# Patient Record
Sex: Female | Born: 1970 | Race: Black or African American | Hispanic: No | Smoking: Never smoker
Health system: Southern US, Community
[De-identification: ages and names within clinical notes are randomized; demographics above are authoritative.]

## PROBLEM LIST (undated history)

## (undated) DIAGNOSIS — E039 Hypothyroidism, unspecified: Secondary | ICD-10-CM

## (undated) DIAGNOSIS — E119 Type 2 diabetes mellitus without complications: Secondary | ICD-10-CM

## (undated) DIAGNOSIS — R51 Headache: Secondary | ICD-10-CM

## (undated) DIAGNOSIS — D259 Leiomyoma of uterus, unspecified: Secondary | ICD-10-CM

## (undated) DIAGNOSIS — B009 Herpesviral infection, unspecified: Secondary | ICD-10-CM

## (undated) DIAGNOSIS — E785 Hyperlipidemia, unspecified: Secondary | ICD-10-CM

## (undated) DIAGNOSIS — I10 Essential (primary) hypertension: Secondary | ICD-10-CM

## (undated) HISTORY — PX: TUBAL LIGATION: SHX77

## (undated) HISTORY — PX: THYROID SURGERY: SHX805

## (undated) HISTORY — PX: COLONOSCOPY: SHX174

## (undated) HISTORY — PX: VAGINAL HYSTERECTOMY: SUR661

## (undated) HISTORY — PX: POLYPECTOMY: SHX149

## (undated) HISTORY — PX: CHOLECYSTECTOMY: SHX55

## (undated) HISTORY — PX: KNEE SURGERY: SHX244

## (undated) HISTORY — DX: Hyperlipidemia, unspecified: E78.5

---

## 1997-09-27 ENCOUNTER — Inpatient Hospital Stay (HOSPITAL_COMMUNITY): Admission: AD | Admit: 1997-09-27 | Discharge: 1997-09-27 | Payer: Self-pay | Admitting: Obstetrics

## 1997-09-30 ENCOUNTER — Inpatient Hospital Stay (HOSPITAL_COMMUNITY): Admission: AD | Admit: 1997-09-30 | Discharge: 1997-09-30 | Payer: Self-pay | Admitting: Obstetrics

## 1998-04-09 ENCOUNTER — Emergency Department (HOSPITAL_COMMUNITY): Admission: EM | Admit: 1998-04-09 | Discharge: 1998-04-09 | Payer: Self-pay | Admitting: Emergency Medicine

## 1998-04-21 ENCOUNTER — Emergency Department (HOSPITAL_COMMUNITY): Admission: EM | Admit: 1998-04-21 | Discharge: 1998-04-21 | Payer: Self-pay | Admitting: Emergency Medicine

## 1998-06-19 ENCOUNTER — Inpatient Hospital Stay (HOSPITAL_COMMUNITY): Admission: AD | Admit: 1998-06-19 | Discharge: 1998-06-19 | Payer: Self-pay | Admitting: Obstetrics & Gynecology

## 1999-05-01 ENCOUNTER — Inpatient Hospital Stay (HOSPITAL_COMMUNITY): Admission: AD | Admit: 1999-05-01 | Discharge: 1999-05-01 | Payer: Self-pay | Admitting: *Deleted

## 1999-05-03 ENCOUNTER — Encounter: Payer: Self-pay | Admitting: Obstetrics

## 1999-05-03 ENCOUNTER — Inpatient Hospital Stay (HOSPITAL_COMMUNITY): Admission: AD | Admit: 1999-05-03 | Discharge: 1999-05-03 | Payer: Self-pay | Admitting: Obstetrics

## 1999-05-05 ENCOUNTER — Encounter: Admission: RE | Admit: 1999-05-05 | Discharge: 1999-05-05 | Payer: Self-pay | Admitting: Obstetrics & Gynecology

## 1999-11-09 ENCOUNTER — Emergency Department (HOSPITAL_COMMUNITY): Admission: EM | Admit: 1999-11-09 | Discharge: 1999-11-09 | Payer: Self-pay | Admitting: Emergency Medicine

## 2000-03-14 ENCOUNTER — Inpatient Hospital Stay (HOSPITAL_COMMUNITY): Admission: AD | Admit: 2000-03-14 | Discharge: 2000-03-14 | Payer: Self-pay | Admitting: Obstetrics

## 2000-07-02 ENCOUNTER — Inpatient Hospital Stay (HOSPITAL_COMMUNITY): Admission: AD | Admit: 2000-07-02 | Discharge: 2000-07-02 | Payer: Self-pay | Admitting: *Deleted

## 2000-08-29 ENCOUNTER — Encounter: Payer: Self-pay | Admitting: Neurology

## 2000-08-29 ENCOUNTER — Ambulatory Visit (HOSPITAL_COMMUNITY): Admission: RE | Admit: 2000-08-29 | Discharge: 2000-08-29 | Payer: Self-pay | Admitting: Neurology

## 2000-12-03 ENCOUNTER — Inpatient Hospital Stay (HOSPITAL_COMMUNITY): Admission: EM | Admit: 2000-12-03 | Discharge: 2000-12-06 | Payer: Self-pay | Admitting: Emergency Medicine

## 2000-12-03 ENCOUNTER — Encounter (INDEPENDENT_AMBULATORY_CARE_PROVIDER_SITE_OTHER): Payer: Self-pay | Admitting: *Deleted

## 2000-12-03 ENCOUNTER — Encounter: Payer: Self-pay | Admitting: Emergency Medicine

## 2000-12-04 ENCOUNTER — Encounter: Payer: Self-pay | Admitting: Family Medicine

## 2000-12-15 ENCOUNTER — Encounter: Admission: RE | Admit: 2000-12-15 | Discharge: 2000-12-15 | Payer: Self-pay | Admitting: Sports Medicine

## 2000-12-16 ENCOUNTER — Encounter: Admission: RE | Admit: 2000-12-16 | Discharge: 2000-12-16 | Payer: Self-pay | Admitting: Internal Medicine

## 2001-03-12 ENCOUNTER — Emergency Department (HOSPITAL_COMMUNITY): Admission: EM | Admit: 2001-03-12 | Discharge: 2001-03-12 | Payer: Self-pay | Admitting: Emergency Medicine

## 2001-04-26 ENCOUNTER — Emergency Department (HOSPITAL_COMMUNITY): Admission: EM | Admit: 2001-04-26 | Discharge: 2001-04-26 | Payer: Self-pay | Admitting: Emergency Medicine

## 2001-05-24 ENCOUNTER — Inpatient Hospital Stay (HOSPITAL_COMMUNITY): Admission: AD | Admit: 2001-05-24 | Discharge: 2001-05-24 | Payer: Self-pay | Admitting: Obstetrics

## 2001-08-18 ENCOUNTER — Emergency Department (HOSPITAL_COMMUNITY): Admission: EM | Admit: 2001-08-18 | Discharge: 2001-08-18 | Payer: Self-pay

## 2001-10-05 ENCOUNTER — Inpatient Hospital Stay (HOSPITAL_COMMUNITY): Admission: AD | Admit: 2001-10-05 | Discharge: 2001-10-05 | Payer: Self-pay | Admitting: *Deleted

## 2002-05-11 ENCOUNTER — Inpatient Hospital Stay (HOSPITAL_COMMUNITY): Admission: AD | Admit: 2002-05-11 | Discharge: 2002-05-11 | Payer: Self-pay | Admitting: *Deleted

## 2002-05-15 ENCOUNTER — Emergency Department (HOSPITAL_COMMUNITY): Admission: EM | Admit: 2002-05-15 | Discharge: 2002-05-15 | Payer: Self-pay | Admitting: Emergency Medicine

## 2002-07-30 ENCOUNTER — Emergency Department (HOSPITAL_COMMUNITY): Admission: EM | Admit: 2002-07-30 | Discharge: 2002-07-30 | Payer: Self-pay | Admitting: Emergency Medicine

## 2002-11-30 ENCOUNTER — Inpatient Hospital Stay (HOSPITAL_COMMUNITY): Admission: AD | Admit: 2002-11-30 | Discharge: 2002-11-30 | Payer: Self-pay | Admitting: *Deleted

## 2002-11-30 ENCOUNTER — Encounter: Payer: Self-pay | Admitting: *Deleted

## 2002-12-28 ENCOUNTER — Emergency Department (HOSPITAL_COMMUNITY): Admission: EM | Admit: 2002-12-28 | Discharge: 2002-12-28 | Payer: Self-pay | Admitting: Emergency Medicine

## 2003-01-28 ENCOUNTER — Emergency Department (HOSPITAL_COMMUNITY): Admission: EM | Admit: 2003-01-28 | Discharge: 2003-01-28 | Payer: Self-pay | Admitting: Emergency Medicine

## 2003-01-29 ENCOUNTER — Emergency Department (HOSPITAL_COMMUNITY): Admission: EM | Admit: 2003-01-29 | Discharge: 2003-01-29 | Payer: Self-pay | Admitting: Emergency Medicine

## 2003-02-24 ENCOUNTER — Emergency Department (HOSPITAL_COMMUNITY): Admission: EM | Admit: 2003-02-24 | Discharge: 2003-02-24 | Payer: Self-pay | Admitting: Emergency Medicine

## 2003-03-16 ENCOUNTER — Emergency Department (HOSPITAL_COMMUNITY): Admission: EM | Admit: 2003-03-16 | Discharge: 2003-03-16 | Payer: Self-pay | Admitting: Emergency Medicine

## 2003-03-25 ENCOUNTER — Emergency Department (HOSPITAL_COMMUNITY): Admission: EM | Admit: 2003-03-25 | Discharge: 2003-03-26 | Payer: Self-pay

## 2003-03-27 ENCOUNTER — Emergency Department (HOSPITAL_COMMUNITY): Admission: EM | Admit: 2003-03-27 | Discharge: 2003-03-27 | Payer: Self-pay | Admitting: Emergency Medicine

## 2003-04-16 ENCOUNTER — Emergency Department (HOSPITAL_COMMUNITY): Admission: EM | Admit: 2003-04-16 | Discharge: 2003-04-16 | Payer: Self-pay | Admitting: Emergency Medicine

## 2003-04-19 ENCOUNTER — Emergency Department (HOSPITAL_COMMUNITY): Admission: EM | Admit: 2003-04-19 | Discharge: 2003-04-19 | Payer: Self-pay | Admitting: Emergency Medicine

## 2003-08-26 ENCOUNTER — Inpatient Hospital Stay (HOSPITAL_COMMUNITY): Admission: AD | Admit: 2003-08-26 | Discharge: 2003-08-26 | Payer: Self-pay | Admitting: Obstetrics & Gynecology

## 2003-10-28 ENCOUNTER — Inpatient Hospital Stay (HOSPITAL_COMMUNITY): Admission: AD | Admit: 2003-10-28 | Discharge: 2003-10-28 | Payer: Self-pay | Admitting: *Deleted

## 2004-02-04 ENCOUNTER — Inpatient Hospital Stay (HOSPITAL_COMMUNITY): Admission: AD | Admit: 2004-02-04 | Discharge: 2004-02-04 | Payer: Self-pay | Admitting: *Deleted

## 2004-03-07 ENCOUNTER — Emergency Department (HOSPITAL_COMMUNITY): Admission: EM | Admit: 2004-03-07 | Discharge: 2004-03-07 | Payer: Self-pay | Admitting: Emergency Medicine

## 2004-06-03 ENCOUNTER — Emergency Department (HOSPITAL_COMMUNITY): Admission: EM | Admit: 2004-06-03 | Discharge: 2004-06-03 | Payer: Self-pay | Admitting: Emergency Medicine

## 2004-09-15 ENCOUNTER — Emergency Department (HOSPITAL_COMMUNITY): Admission: EM | Admit: 2004-09-15 | Discharge: 2004-09-15 | Payer: Self-pay | Admitting: Emergency Medicine

## 2005-02-26 ENCOUNTER — Other Ambulatory Visit: Admission: RE | Admit: 2005-02-26 | Discharge: 2005-02-26 | Payer: Self-pay | Admitting: Obstetrics and Gynecology

## 2005-03-11 ENCOUNTER — Emergency Department (HOSPITAL_COMMUNITY): Admission: EM | Admit: 2005-03-11 | Discharge: 2005-03-11 | Payer: Self-pay | Admitting: Emergency Medicine

## 2005-04-09 ENCOUNTER — Ambulatory Visit: Payer: Self-pay | Admitting: Family Medicine

## 2005-05-17 ENCOUNTER — Emergency Department (HOSPITAL_COMMUNITY): Admission: EM | Admit: 2005-05-17 | Discharge: 2005-05-17 | Payer: Self-pay | Admitting: Emergency Medicine

## 2005-05-26 ENCOUNTER — Ambulatory Visit: Payer: Self-pay | Admitting: Family Medicine

## 2005-08-26 ENCOUNTER — Ambulatory Visit: Payer: Self-pay | Admitting: Sports Medicine

## 2005-11-29 ENCOUNTER — Ambulatory Visit: Payer: Self-pay | Admitting: Family Medicine

## 2005-11-30 ENCOUNTER — Encounter: Admission: RE | Admit: 2005-11-30 | Discharge: 2005-11-30 | Payer: Self-pay | Admitting: Sports Medicine

## 2005-12-13 ENCOUNTER — Encounter (INDEPENDENT_AMBULATORY_CARE_PROVIDER_SITE_OTHER): Payer: Self-pay | Admitting: Specialist

## 2005-12-13 ENCOUNTER — Other Ambulatory Visit: Admission: RE | Admit: 2005-12-13 | Discharge: 2005-12-13 | Payer: Self-pay | Admitting: Interventional Radiology

## 2005-12-13 ENCOUNTER — Encounter: Admission: RE | Admit: 2005-12-13 | Discharge: 2005-12-13 | Payer: Self-pay | Admitting: Sports Medicine

## 2006-01-06 ENCOUNTER — Emergency Department (HOSPITAL_COMMUNITY): Admission: EM | Admit: 2006-01-06 | Discharge: 2006-01-06 | Payer: Self-pay | Admitting: Family Medicine

## 2006-01-13 ENCOUNTER — Ambulatory Visit: Payer: Self-pay | Admitting: Family Medicine

## 2006-02-11 ENCOUNTER — Other Ambulatory Visit: Payer: Self-pay

## 2006-02-20 ENCOUNTER — Emergency Department (HOSPITAL_COMMUNITY): Admission: EM | Admit: 2006-02-20 | Discharge: 2006-02-20 | Payer: Self-pay | Admitting: Emergency Medicine

## 2006-02-28 ENCOUNTER — Other Ambulatory Visit: Admission: RE | Admit: 2006-02-28 | Discharge: 2006-02-28 | Payer: Self-pay | Admitting: Obstetrics and Gynecology

## 2006-03-01 ENCOUNTER — Inpatient Hospital Stay: Payer: Self-pay | Admitting: Otolaryngology

## 2006-07-08 ENCOUNTER — Emergency Department (HOSPITAL_COMMUNITY): Admission: EM | Admit: 2006-07-08 | Discharge: 2006-07-08 | Payer: Self-pay | Admitting: Emergency Medicine

## 2006-07-27 ENCOUNTER — Emergency Department (HOSPITAL_COMMUNITY): Admission: EM | Admit: 2006-07-27 | Discharge: 2006-07-27 | Payer: Self-pay | Admitting: Family Medicine

## 2006-09-01 ENCOUNTER — Emergency Department (HOSPITAL_COMMUNITY): Admission: EM | Admit: 2006-09-01 | Discharge: 2006-09-01 | Payer: Self-pay | Admitting: Emergency Medicine

## 2006-12-30 ENCOUNTER — Telehealth: Payer: Self-pay | Admitting: *Deleted

## 2007-01-06 ENCOUNTER — Inpatient Hospital Stay (HOSPITAL_COMMUNITY): Admission: AD | Admit: 2007-01-06 | Discharge: 2007-01-07 | Payer: Self-pay | Admitting: Obstetrics and Gynecology

## 2007-02-22 ENCOUNTER — Encounter (INDEPENDENT_AMBULATORY_CARE_PROVIDER_SITE_OTHER): Payer: Self-pay | Admitting: Obstetrics and Gynecology

## 2007-02-22 ENCOUNTER — Inpatient Hospital Stay (HOSPITAL_COMMUNITY): Admission: RE | Admit: 2007-02-22 | Discharge: 2007-02-23 | Payer: Self-pay | Admitting: Obstetrics and Gynecology

## 2007-03-05 ENCOUNTER — Inpatient Hospital Stay (HOSPITAL_COMMUNITY): Admission: AD | Admit: 2007-03-05 | Discharge: 2007-03-05 | Payer: Self-pay | Admitting: Obstetrics and Gynecology

## 2007-07-30 ENCOUNTER — Emergency Department (HOSPITAL_COMMUNITY): Admission: EM | Admit: 2007-07-30 | Discharge: 2007-07-30 | Payer: Self-pay | Admitting: Emergency Medicine

## 2007-12-15 ENCOUNTER — Encounter (INDEPENDENT_AMBULATORY_CARE_PROVIDER_SITE_OTHER): Payer: Self-pay | Admitting: Family Medicine

## 2007-12-15 ENCOUNTER — Ambulatory Visit: Admission: RE | Admit: 2007-12-15 | Discharge: 2007-12-15 | Payer: Self-pay | Admitting: Family Medicine

## 2007-12-15 ENCOUNTER — Ambulatory Visit: Payer: Self-pay | Admitting: Vascular Surgery

## 2008-05-03 ENCOUNTER — Ambulatory Visit: Payer: Self-pay | Admitting: Family Medicine

## 2008-05-20 ENCOUNTER — Telehealth (INDEPENDENT_AMBULATORY_CARE_PROVIDER_SITE_OTHER): Payer: Self-pay | Admitting: *Deleted

## 2008-06-03 ENCOUNTER — Encounter (INDEPENDENT_AMBULATORY_CARE_PROVIDER_SITE_OTHER): Payer: Self-pay | Admitting: *Deleted

## 2008-06-26 ENCOUNTER — Emergency Department (HOSPITAL_COMMUNITY): Admission: EM | Admit: 2008-06-26 | Discharge: 2008-06-26 | Payer: Self-pay | Admitting: Emergency Medicine

## 2008-07-09 ENCOUNTER — Emergency Department (HOSPITAL_COMMUNITY): Admission: EM | Admit: 2008-07-09 | Discharge: 2008-07-09 | Payer: Self-pay | Admitting: Emergency Medicine

## 2008-10-10 ENCOUNTER — Telehealth (INDEPENDENT_AMBULATORY_CARE_PROVIDER_SITE_OTHER): Payer: Self-pay | Admitting: Family Medicine

## 2008-10-11 ENCOUNTER — Ambulatory Visit: Payer: Self-pay | Admitting: Family Medicine

## 2008-10-11 ENCOUNTER — Telehealth (INDEPENDENT_AMBULATORY_CARE_PROVIDER_SITE_OTHER): Payer: Self-pay | Admitting: Family Medicine

## 2008-10-11 DIAGNOSIS — T7840XA Allergy, unspecified, initial encounter: Secondary | ICD-10-CM | POA: Insufficient documentation

## 2008-10-27 ENCOUNTER — Emergency Department (HOSPITAL_COMMUNITY): Admission: EM | Admit: 2008-10-27 | Discharge: 2008-10-27 | Payer: Self-pay | Admitting: Family Medicine

## 2008-12-09 ENCOUNTER — Telehealth (INDEPENDENT_AMBULATORY_CARE_PROVIDER_SITE_OTHER): Payer: Self-pay | Admitting: Family Medicine

## 2008-12-10 ENCOUNTER — Telehealth (INDEPENDENT_AMBULATORY_CARE_PROVIDER_SITE_OTHER): Payer: Self-pay | Admitting: Family Medicine

## 2008-12-12 ENCOUNTER — Ambulatory Visit: Payer: Self-pay | Admitting: Family Medicine

## 2008-12-12 DIAGNOSIS — M76899 Other specified enthesopathies of unspecified lower limb, excluding foot: Secondary | ICD-10-CM | POA: Insufficient documentation

## 2008-12-30 ENCOUNTER — Inpatient Hospital Stay (HOSPITAL_COMMUNITY): Admission: AD | Admit: 2008-12-30 | Discharge: 2008-12-30 | Payer: Self-pay | Admitting: Obstetrics and Gynecology

## 2009-01-06 ENCOUNTER — Emergency Department (HOSPITAL_COMMUNITY): Admission: EM | Admit: 2009-01-06 | Discharge: 2009-01-07 | Payer: Self-pay | Admitting: Emergency Medicine

## 2009-01-07 ENCOUNTER — Other Ambulatory Visit: Payer: Self-pay | Admitting: Emergency Medicine

## 2009-01-08 ENCOUNTER — Telehealth: Payer: Self-pay | Admitting: Family Medicine

## 2009-01-08 ENCOUNTER — Ambulatory Visit: Payer: Self-pay | Admitting: Family Medicine

## 2009-01-08 ENCOUNTER — Observation Stay (HOSPITAL_COMMUNITY): Admission: AD | Admit: 2009-01-08 | Discharge: 2009-01-09 | Payer: Self-pay | Admitting: Family Medicine

## 2009-01-08 DIAGNOSIS — R109 Unspecified abdominal pain: Secondary | ICD-10-CM | POA: Insufficient documentation

## 2009-01-08 DIAGNOSIS — R112 Nausea with vomiting, unspecified: Secondary | ICD-10-CM

## 2009-01-13 ENCOUNTER — Encounter: Payer: Self-pay | Admitting: *Deleted

## 2009-01-13 ENCOUNTER — Ambulatory Visit: Payer: Self-pay | Admitting: Family Medicine

## 2009-01-13 LAB — CONVERTED CEMR LAB
Basophils Relative: 0 % (ref 0–1)
CO2: 23 meq/L (ref 19–32)
CRP, High Sensitivity: 9 — ABNORMAL HIGH
Creatinine, Ser: 0.78 mg/dL (ref 0.40–1.20)
Eosinophils Absolute: 0.1 10*3/uL (ref 0.0–0.7)
Glucose, Bld: 92 mg/dL (ref 70–99)
MCHC: 33.8 g/dL (ref 30.0–36.0)
MCV: 88.8 fL (ref 78.0–100.0)
Monocytes Absolute: 0.4 10*3/uL (ref 0.1–1.0)
Monocytes Relative: 8 % (ref 3–12)
Neutrophils Relative %: 40 % — ABNORMAL LOW (ref 43–77)
RBC: 4.8 M/uL (ref 3.87–5.11)
Total Bilirubin: 0.3 mg/dL (ref 0.3–1.2)

## 2009-06-04 ENCOUNTER — Inpatient Hospital Stay (HOSPITAL_COMMUNITY): Admission: AD | Admit: 2009-06-04 | Discharge: 2009-06-04 | Payer: Self-pay | Admitting: Obstetrics & Gynecology

## 2009-08-04 ENCOUNTER — Emergency Department (HOSPITAL_COMMUNITY): Admission: EM | Admit: 2009-08-04 | Discharge: 2009-08-04 | Payer: Self-pay | Admitting: Emergency Medicine

## 2009-08-04 ENCOUNTER — Ambulatory Visit: Payer: Self-pay | Admitting: Vascular Surgery

## 2009-08-04 ENCOUNTER — Encounter (INDEPENDENT_AMBULATORY_CARE_PROVIDER_SITE_OTHER): Payer: Self-pay | Admitting: Emergency Medicine

## 2010-05-19 ENCOUNTER — Inpatient Hospital Stay (HOSPITAL_COMMUNITY): Admission: EM | Admit: 2010-05-19 | Discharge: 2010-05-19 | Payer: Self-pay | Admitting: Obstetrics & Gynecology

## 2010-06-26 IMAGING — CT CT PELVIS W/ CM
1 of 2 series · 15 of 32 positions shown, 19 images · IV contrast (APPLIED)
Comparison: None

CT ABDOMEN

CLINICAL DATA: Nausea, vomiting, abdominal pain, diarrhea, history
hypertension, cholecystectomy

CT ABDOMEN AND PELVIS WITH CONTRAST
TECHNIQUE: Multidetector CT imaging of the abdomen and pelvis was
performed using the standard protocol following bolus
administration of intravenous contrast.
Contrast: 100 ml [id number]; oral contrast not administered

[Series 2: abd/pelv with 5.0 b31f st · axial · 0.65mm/px · z∈[+822,+1262]mm · 15 of 97 slices shown, 19 images]
[im 5/97  soft-tissue]
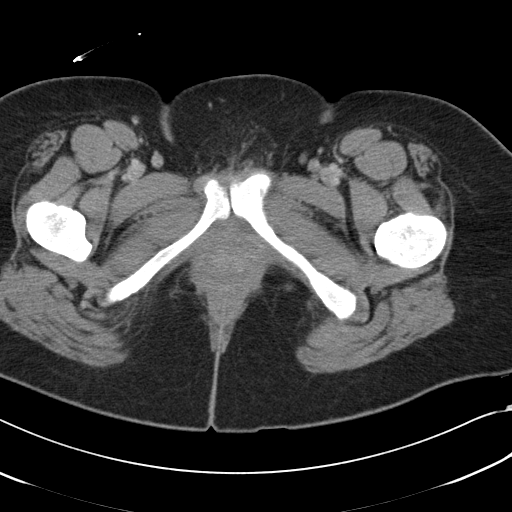
[im 5/97  bone]
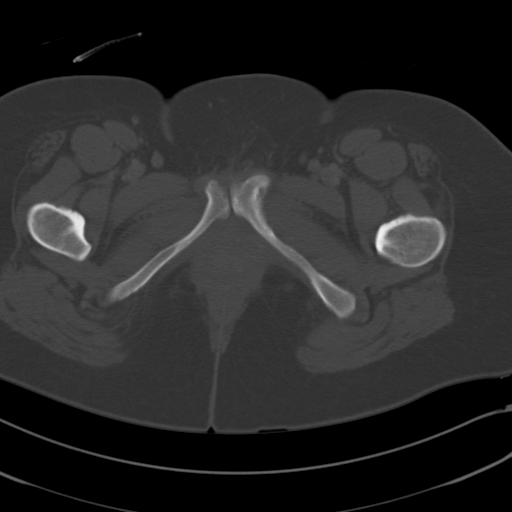
[im 13/97  soft-tissue]
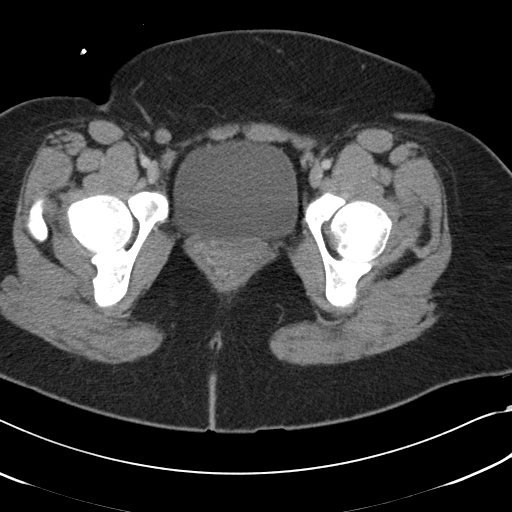
[im 21/97  soft-tissue]
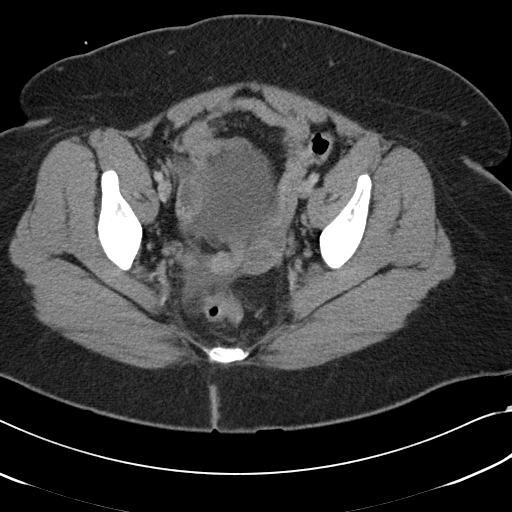
[im 29/97  soft-tissue]
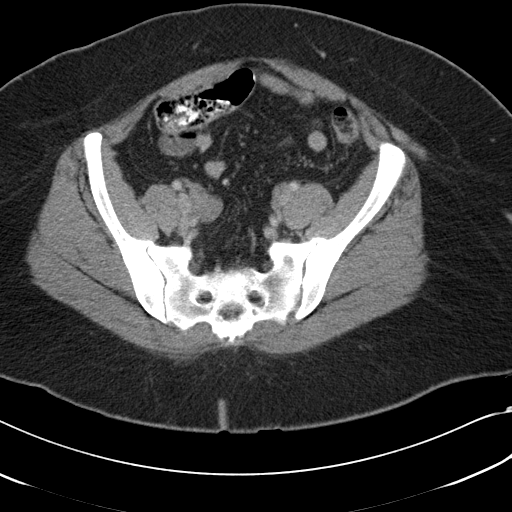
[im 33/97  soft-tissue]
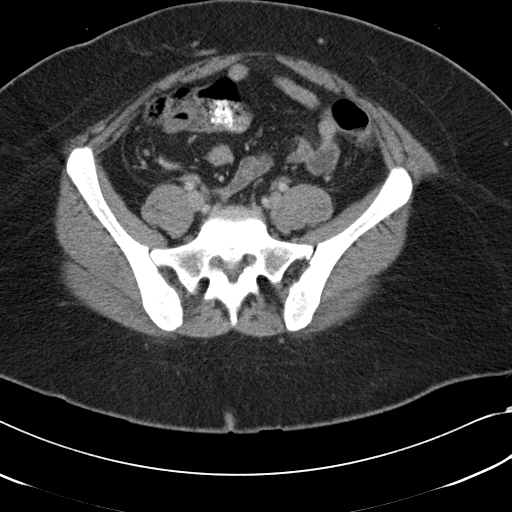
[im 41/97  soft-tissue]
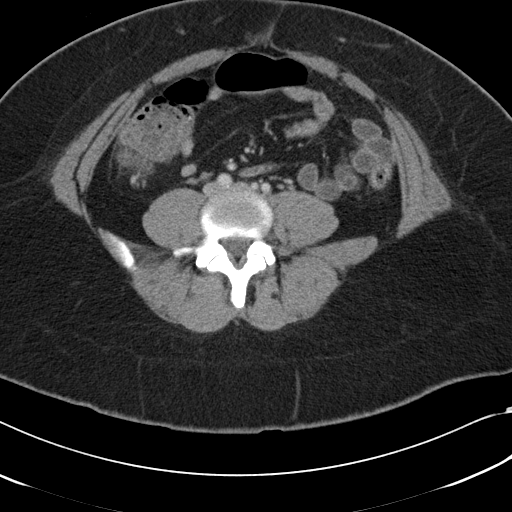
[im 49/97  soft-tissue]
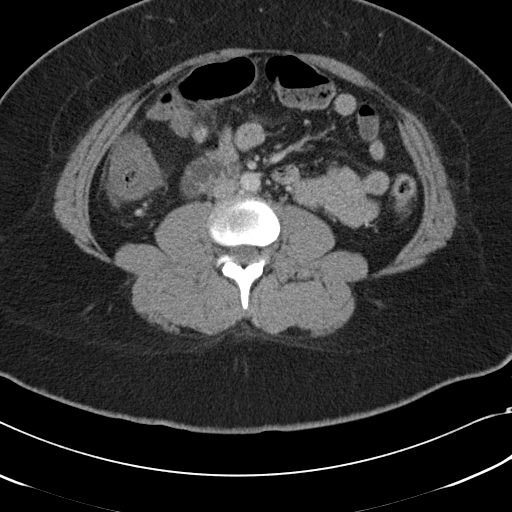
[im 57/97  soft-tissue]
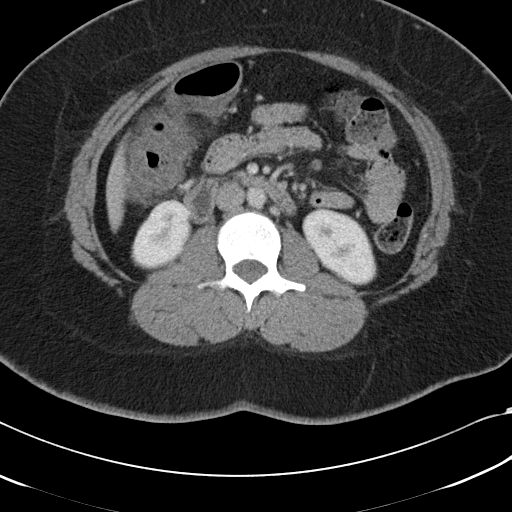
[im 65/97  soft-tissue]
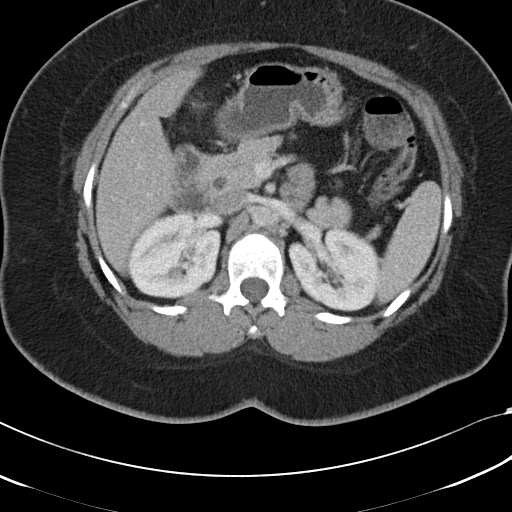
[im 65/97  bone]
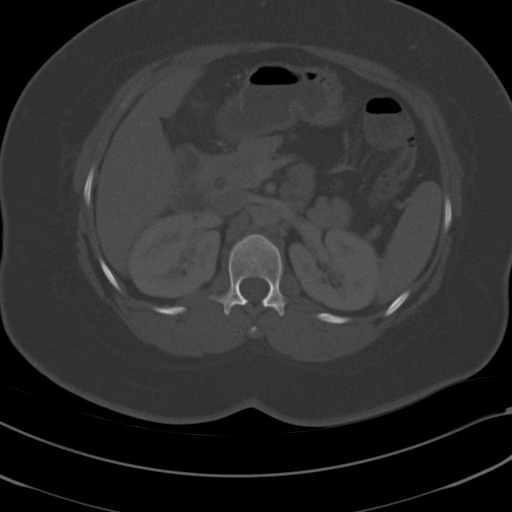
[im 69/97  soft-tissue]
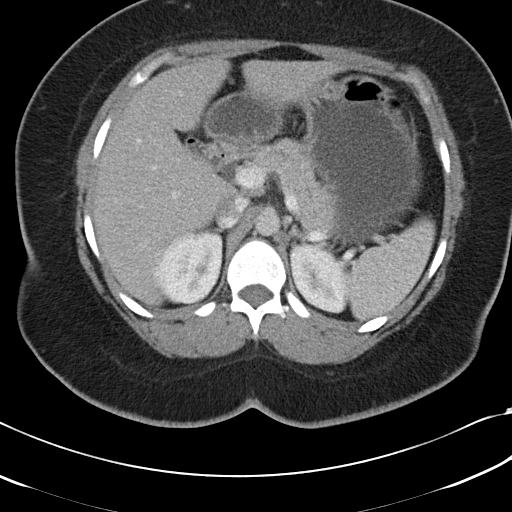
[im 77/97  soft-tissue]
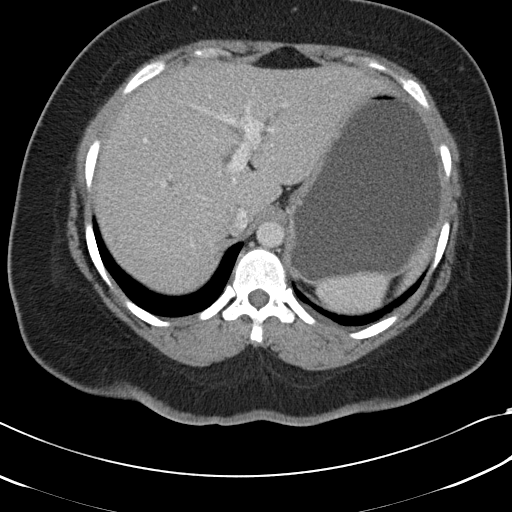
[im 81/97  lung]
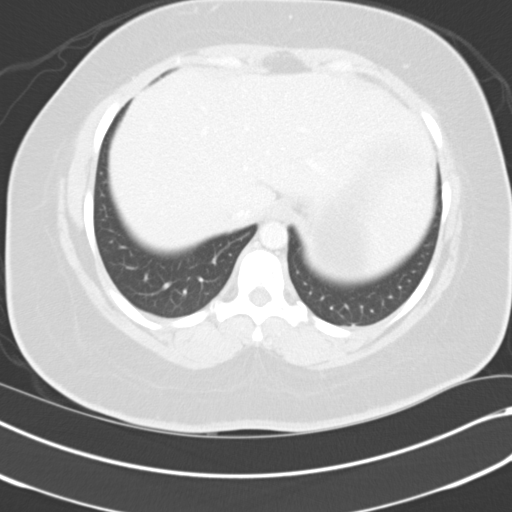
[im 85/97  soft-tissue]
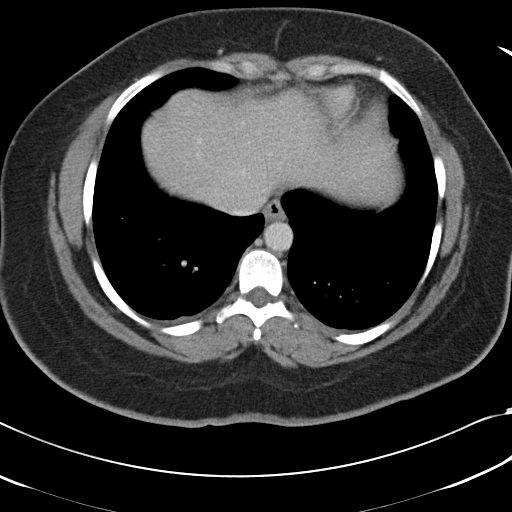
[im 85/97  lung]
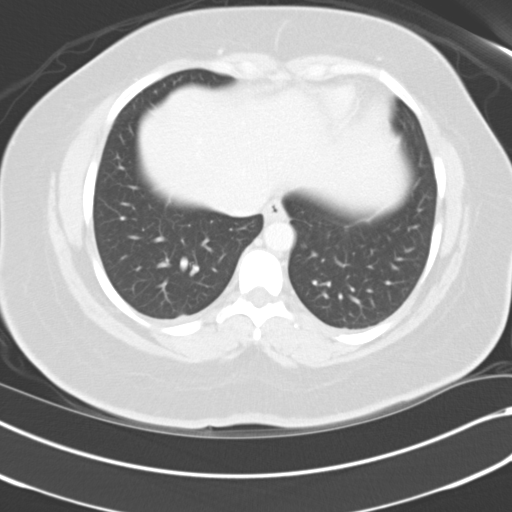
[im 89/97  lung]
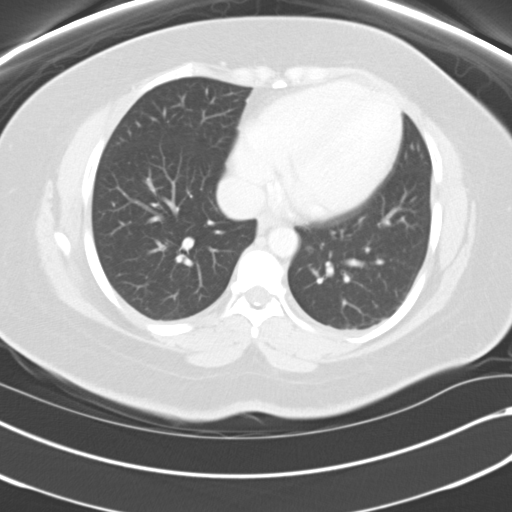
[im 93/97  soft-tissue]
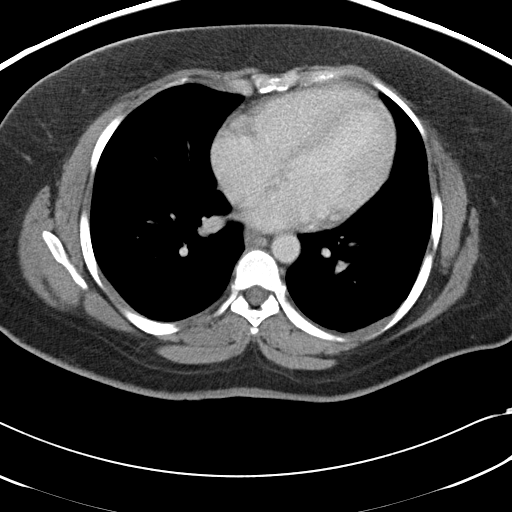
[im 93/97  lung]
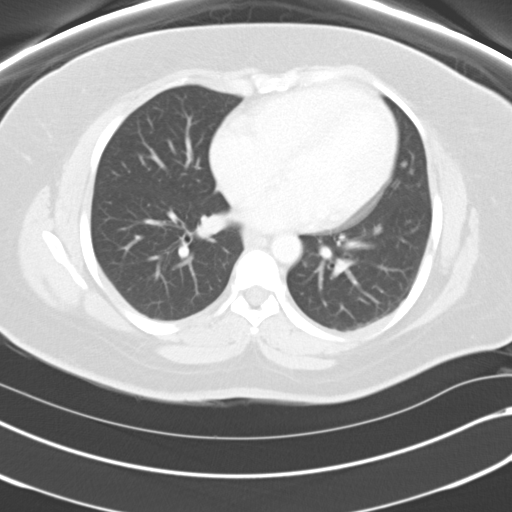

[15 of 32 positions shown; findings below may reference images not displayed]

FINDINGS: Lung bases clear.
Status post cholecystectomy.
Liver, spleen, pancreas, kidneys, and adrenal glands normal.
Stomach and small bowel loops unremarkable.
Significant colonic wall thickening of mid to distal ascending
colon with surrounding infiltrative/edematous changes.
Few colonic diverticula are identified at right colon.
Findings may represent segmental colitis or diverticulitis.
No evidence of perforation or abscess.
IMPRESSION: Inflammatory process of ascending colon where significant wall
thickening and pericolic inflammatory changes are identified,
question diverticulitis versus colitis.

CT PELVIS
FINDINGS: Small amount nonspecific free pelvic fluid.
Uterus surgically absent.
Unremarkable bladder distal ureters.
No pelvic mass or adenopathy.
Normal appendix.
No hernia or focal bony lesion.
Bones unremarkable.
IMPRESSION: Small amount nonspecific free pelvic fluid, see CT abdomen report.

## 2010-08-23 ENCOUNTER — Encounter: Payer: Self-pay | Admitting: Family Medicine

## 2010-09-10 ENCOUNTER — Encounter: Payer: Self-pay | Admitting: *Deleted

## 2010-10-14 LAB — URINALYSIS, ROUTINE W REFLEX MICROSCOPIC
Nitrite: NEGATIVE
Specific Gravity, Urine: 1.025 (ref 1.005–1.030)
pH: 5.5 (ref 5.0–8.0)

## 2010-10-17 LAB — POCT I-STAT, CHEM 8
Glucose, Bld: 107 mg/dL — ABNORMAL HIGH (ref 70–99)
HCT: 45 % (ref 36.0–46.0)
Hemoglobin: 15.3 g/dL — ABNORMAL HIGH (ref 12.0–15.0)
Potassium: 4.2 mEq/L (ref 3.5–5.1)
Sodium: 138 mEq/L (ref 135–145)

## 2010-11-04 LAB — URINALYSIS, ROUTINE W REFLEX MICROSCOPIC
Bilirubin Urine: NEGATIVE
Nitrite: NEGATIVE
Specific Gravity, Urine: 1.025 (ref 1.005–1.030)
Urobilinogen, UA: 0.2 mg/dL (ref 0.0–1.0)

## 2010-11-04 LAB — GC/CHLAMYDIA PROBE AMP, URINE: Chlamydia, Swab/Urine, PCR: NEGATIVE

## 2010-11-04 LAB — CBC
Hemoglobin: 14.2 g/dL (ref 12.0–15.0)
MCHC: 34 g/dL (ref 30.0–36.0)
MCV: 90.4 fL (ref 78.0–100.0)
RBC: 4.63 MIL/uL (ref 3.87–5.11)
RDW: 13.4 % (ref 11.5–15.5)

## 2010-11-09 LAB — CBC
HCT: 39.5 % (ref 36.0–46.0)
Hemoglobin: 13.3 g/dL (ref 12.0–15.0)
MCHC: 33.7 g/dL (ref 30.0–36.0)
MCHC: 33.8 g/dL (ref 30.0–36.0)
MCV: 89.3 fL (ref 78.0–100.0)
Platelets: 262 10*3/uL (ref 150–400)
Platelets: 240 10*3/uL (ref 150–400)
RBC: 4.39 MIL/uL (ref 3.87–5.11)
RBC: 4.95 MIL/uL (ref 3.87–5.11)
WBC: 6.8 10*3/uL (ref 4.0–10.5)
WBC: 7.5 10*3/uL (ref 4.0–10.5)

## 2010-11-09 LAB — DIFFERENTIAL
Eosinophils Absolute: 0.1 10*3/uL (ref 0.0–0.7)
Eosinophils Relative: 2 % (ref 0–5)
Lymphocytes Relative: 47 % — ABNORMAL HIGH (ref 12–46)
Lymphocytes Relative: 19 % (ref 12–46)
Lymphs Abs: 3.2 10*3/uL (ref 0.7–4.0)
Lymphs Abs: 1.4 10*3/uL (ref 0.7–4.0)
Monocytes Absolute: 0.5 10*3/uL (ref 0.1–1.0)
Monocytes Relative: 7 % (ref 3–12)
Neutro Abs: 5.6 10*3/uL (ref 1.7–7.7)
Neutrophils Relative %: 75 % (ref 43–77)

## 2010-11-09 LAB — URINALYSIS, ROUTINE W REFLEX MICROSCOPIC
Bilirubin Urine: NEGATIVE
Glucose, UA: NEGATIVE mg/dL
Hgb urine dipstick: NEGATIVE
Ketones, ur: NEGATIVE mg/dL
Nitrite: NEGATIVE
Specific Gravity, Urine: 1.007 (ref 1.005–1.030)
pH: 6 (ref 5.0–8.0)

## 2010-11-09 LAB — COMPREHENSIVE METABOLIC PANEL
ALT: 17 U/L (ref 0–35)
ALT: 24 U/L (ref 0–35)
AST: 23 U/L (ref 0–37)
Albumin: 3.7 g/dL (ref 3.5–5.2)
Alkaline Phosphatase: 65 U/L (ref 39–117)
CO2: 31 mEq/L (ref 19–32)
Calcium: 9.2 mg/dL (ref 8.4–10.5)
Creatinine, Ser: 0.9 mg/dL (ref 0.4–1.2)
GFR calc Af Amer: >60 mL/min (ref >60)
GFR calc non Af Amer: >60 mL/min (ref >60)
Glucose, Bld: 128 mg/dL — ABNORMAL HIGH (ref 70–99)
Potassium: 3.1 mEq/L — ABNORMAL LOW (ref 3.5–5.1)
Sodium: 139 mEq/L (ref 135–145)
Sodium: 141 mEq/L (ref 135–145)

## 2010-11-09 LAB — STOOL CULTURE

## 2010-11-09 LAB — POCT PREGNANCY, URINE: Preg Test, Ur: NEGATIVE

## 2010-11-09 LAB — URINE MICROSCOPIC-ADD ON

## 2010-11-09 LAB — CLOSTRIDIUM DIFFICILE EIA: C difficile Toxins A+B, EIA: NEGATIVE

## 2010-11-09 LAB — URINE CULTURE

## 2010-11-09 LAB — FECAL LACTOFERRIN, QUANT: Fecal Lactoferrin: POSITIVE

## 2010-11-10 LAB — URINALYSIS, ROUTINE W REFLEX MICROSCOPIC
Bilirubin Urine: NEGATIVE
Glucose, UA: NEGATIVE mg/dL
Hgb urine dipstick: NEGATIVE
Ketones, ur: NEGATIVE mg/dL
Nitrite: NEGATIVE
Protein, ur: NEGATIVE mg/dL
Specific Gravity, Urine: 1.02 (ref 1.005–1.030)
Urobilinogen, UA: 0.2 mg/dL (ref 0.0–1.0)
pH: 5.5 (ref 5.0–8.0)

## 2010-11-10 LAB — CBC
HCT: 40.8 % (ref 36.0–46.0)
Hemoglobin: 14.4 g/dL (ref 12.0–15.0)
MCHC: 35.4 g/dL (ref 30.0–36.0)
MCV: 89.9 fL (ref 78.0–100.0)
Platelets: 211 K/uL (ref 150–400)
RBC: 4.55 MIL/uL (ref 3.87–5.11)
RDW: 13.9 % (ref 11.5–15.5)
WBC: 5.9 K/uL (ref 4.0–10.5)

## 2010-11-10 LAB — DIFFERENTIAL
Eosinophils Absolute: 0.1 10*3/uL (ref 0.0–0.7)
Lymphs Abs: 2.7 10*3/uL (ref 0.7–4.0)
Monocytes Absolute: 0.5 10*3/uL (ref 0.1–1.0)
Monocytes Relative: 8 % (ref 3–12)
Neutro Abs: 2.6 10*3/uL (ref 1.7–7.7)
Neutrophils Relative %: 45 % (ref 43–77)

## 2010-11-10 LAB — WET PREP, GENITAL: Trich, Wet Prep: NONE SEEN

## 2010-12-15 NOTE — H&P (Signed)
NAME:  Angela Cisneros, Angela Cisneros           ACCOUNT NO.:  [id number]   MEDICAL RECORD NO.:  [id number]          PATIENT TYPE:  AMB   LOCATION:  SDC                           FACILITY:  [facility]   PHYSICIAN:  Janine Limbo, M.D.DATE OF BIRTH:  01/18/1971   DATE OF ADMISSION:  02/22/2007  DATE OF DISCHARGE:                              HISTORY & PHYSICAL   HISTORY OF PRESENT ILLNESS:  Ms. Mesina is a 40 year old female,  para 5-1-2-6, who presents for a laparoscopy-assisted vaginal  hysterectomy.  The patient has been followed at the [facility].  The  patient complains of fibroids, dysmenorrhea, and pelvic pain.  The  patient's evaluation has included a negative gonorrhea and Chlamydia  culture.  An ultrasound was performed and it showed a fibroid uterus  with a measurement of 8.3 x 6.1 cm.  The largest fibroid measured 2.3  cm.  The ovaries appeared normal.  Her most recent Pap was within normal  limits.  The patient has been evaluated by the specialist and no other  etiology for her discomfort has been found.  The patient is status post  tubal ligation.  The patient has a history of herpes simplex virus.   DRUG ALLERGIES:  No known drug allergies.   OBSTETRICAL HISTORY:  1. The patient has had 5 term vaginal deliveries and 1 preterm vaginal      delivery.  2. She had 2 miscarriages.   PAST MEDICAL HISTORY:  1. Hypertension.  2. Thyroid disease.  3. Herpes simplex virus.   She had a cholecystectomy in 2005.  She had her wisdom teeth removed  many years ago.   CURRENT MEDICATIONS:  Valtrex and Accupril.   SOCIAL HISTORY:  The patient denies cigarette use, alcohol use, and  recreational drug use.   REVIEW OF SYSTEMS:  The patient complains of dyspareunia.  She also  complains of headaches.   FAMILY HISTORY:  The patient has a family history of:  1. Hypertension.  2. Rheumatoid arthritis.  3. Diabetes.  4. Cancer.  5. Strokes.  6. Liver problems.   PHYSICAL EXAM:  Weight is 219 pounds, height is 5 feet 5 inches.  HEENT:  Within normal limits.  CHEST:  Clear.  HEART:  Regular rate and rhythm.  BREASTS:  Without masses.  ABDOMEN:  Nontender.  EXTREMITIES:  Grossly normal.  NEUROLOGIC EXAM:  Grossly normal.  PELVIC EXAM:  External genitalia is normal, the vagina is normal, cervix  is nontender.  The uterus is upper limits normal size and nontender.  ADNEXA:  No masses.  RECTOVAGINAL EXAM:  Confirmed.   ASSESSMENT:  1. Fibroid uterus.  2. Dysmenorrhea.  3. Pelvic pain.  4. Dyspareunia.  5. Obesity.   PLAN:  The patient will undergo a laparoscopy-assisted vaginal  hysterectomy.  She understands the indications for her surgical  procedure and she accepts the risks of, but not limited to, anesthetic  complications, bleeding, infections, and possible damage to the  surrounding organs.  She understands that no guarantees can be given  concerning the total relief of her pelvic  discomfort.      Janine Limbo, M.D.  Electronically Signed     AVS/MEDQ  D:  02/20/2007  T:  02/20/2007  Job:  [id number]

## 2010-12-15 NOTE — Discharge Summary (Signed)
[name], Angela Cisneros           ACCOUNT NO.:  [id number]   MEDICAL RECORD NO.:  [id number]          PATIENT TYPE:  OBV   LOCATION:  5041                         FACILITY:  [facility]   PHYSICIAN:  Leighton Roach McDiarmid, M.D.DATE OF BIRTH:  1971-06-28   DATE OF ADMISSION:  01/08/2009  DATE OF DISCHARGE:  01/09/2009                               DISCHARGE SUMMARY   PRIMARY CARE PHYSICIAN:  Levander Campion, MD, [facility].   DISCHARGE DIAGNOSES:  1. Colitis.  2. Nausea and vomiting.   DISCHARGE MEDICATIONS:  1. Protonix 40 mg by mouth daily.  2. Zofran 4 mg by mouth every 6 hours as needed for nausea.  3. Cipro 500 mg p.o. b.i.d. x5 days.   STUDIES:  CT of abdomen and pelvis with contrast on January 07, 2009,  impression; inflammatory process of ascending colon with significant  wall thickening and pericolonic inflammatory changes are identified,  question diverticulitis versus colitis.   LABORATORY DATA:  Sodium 139, potassium 3.1, chloride 101, CO2 of 31,  glucose 128, BUN 4, creatinine 0.83, total bilirubin 0.4, alk phos 65,  AST 19, ALT 17, total protein 5.7, albumin 2.8, and calcium 7.8.  White  blood cell count 6.1, hemoglobin 13.3, hematocrit 39.5, and platelets  199.  C. diff negative x1.  Fecal lactoferrin positive x1.  Urinalysis  with trace leukocytes.  Stool culture pending.  Urine pregnancy test  negative.  Lipase 21.   BRIEF HOSPITAL COURSE:  Angela Cisneros is a 40 year old female with  no significant past medical history who was admitted for observation  overnight due to nausea, vomiting, and diarrhea and found to have  colitis on a CT scan.  Colitis.  The patient was observed overnight.  She was treated with  maintenance IV fluids, Zofran, and Protonix for her symptom control.  She was also treated with IV Cipro.  It was decided she would not be  given a course of Flagyl, she had just finished a 7-day course of this  medication secondary to  bacterial vaginosis.  Studies were negative with  the exception of hypokalemia that was resolved with replacement and  positive fecal lactoferrin.  The patient's nausea and vomiting did  resolve overnight.  She did continue to have small amount of diarrhea  without blood.  The following day, she tolerated advancement of her diet  and wished for discharge the morning after admission.  The patient's  vital signs were stable and she was afebrile as well as was improving  clinically.  She was discharged with instructions to follow up with her  primary care physician in 1 week.  She was given a prescription of Cipro  500 mg p.o. b.i.d. x5 days as well as Protonix and Zofran for  symptomatic control.   FOLLOWUP:  She will follow up with primary care physician in 1 week.   FOLLOWUP ISSUES:  Monitor resolution of colitis.      Helane Rima, MD  Electronically Signed      Leighton Roach McDiarmid, M.D.  Electronically Signed    EW/MEDQ  D:  01/09/2009  T:  01/10/2009  Job:  [id number] 

## 2010-12-15 NOTE — H&P (Signed)
Angela Cisneros, Angela Cisneros           ACCOUNT NO.:  [id number]   MEDICAL RECORD NO.:  [id number]          PATIENT TYPE:  INP   LOCATION:  5041                         FACILITY:  [facility]   PHYSICIAN:  Pearlean Brownie, M.D.DATE OF BIRTH:  [birth year]   DATE OF ADMISSION:  01/08/2009  DATE OF DISCHARGE:                              HISTORY & PHYSICAL   CHIEF COMPLAINT:  Vomiting and diarrhea with blood.   PRIMARY CARE PHYSICIAN:  Dr. Levander Campion.   HISTORY OF PRESENT ILLNESS:  This is a 40 year old previously healthy  female who states that she has had 3-day history of right upper quadrant  and epigastric tenderness associated with nausea, vomiting and loose  diarrhea with blood streaks.  She characterizes her pain as  intermittent, twisting in nature that progressively worsens to 10/10  pain and backs off to 1/10.  She has been unable to keep anything down  for the last 2 days.  Denies any vomiting today.  When she did have  emesis it was nonbilious and nonbloody.  She was seen in the [facility]  ER x2 yesterday for this pain and her blood streaked stools and records  have been reviewed.  CBC and CMP were normal.  She was afebrile and a CT  scan which was obtained showed colitis versus diverticulitis of the  ascending colon.  She was treated with Cipro and Flagyl for 10-day  course as well as Zofran and Percocet.  She states that the Zofran has  not worked.  She denies any sick contacts, she lives alone.  Last meal  was a pizza 2 days ago and she does not have any recent change in her  diet or recent travel.   Of note, the patient was also seen at [facility] in the MAU on the  28th of last month for pelvic pain and diagnosed with BV, ultrasound  showing left adnexal mass and they recommended repeating this in 8  weeks.  She was treated for this with Flagyl x7 days as well as Toradol  and Percocet.  There is no longer an issue.  She also endorses dizziness  when standing and  feeling weak, generalized weakness.   PAST MEDICAL HISTORY:  Noncontributory.   PAST SURGICAL HISTORY:  1. Cholecystectomy in 2000 for ruptured gallbladder per patient.  2. Partial hysterectomy for fibroids in 2009.  3. Thyroidectomy for a goiter in 2000.   FAMILY HISTORY:  Noncontributory.   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  She denies  any tobacco or recreational drugs and she endorses occasional alcohol  use.  She lives alone.   PHYSICAL EXAM:  Temperature 98.2, heart rate 93, blood pressure 135/90,  respiratory rate 20, weight 202 pounds.  GENERAL:  Tired and ill-appearing African American female.  HEENT:  Normocephalic/atraumatic, pupils equally round and reactive to  light, extraocular movements intact.  Moist mucous membranes.  No  pharyngeal erythema or edema.  No lymphadenopathy.  Midline scar from  thyroidectomy.  LUNGS:  Clear to auscultation bilaterally.  No crackles or wheezing.  HEART:  Normal S1 - S2.  No  murmurs, rubs or gallops.  ABDOMEN: Soft, tender to palpation in the right upper quadrant and  epigastric regions.  No tenderness in the right lower quadrant and left  lower quadrant, left upper quadrant or suprapubically.  No CVA  tenderness.  Bowel sounds normoactive.  No hepatosplenomegaly.  No  masses appreciated.  RECTAL:  No external abnormalities noted.  Normal sphincter tone.  No  hemorrhoids.  Hemoccult positive, loose stools noted in vault.  PULSES:  Two plus peripheral pulses.  EXTREMITIES:  No edema or clubbing or cyanosis.  SKIN:  No new rashes or jaundice.   RECENT LABS:  Obtained yesterday in the ER:  White count 7.5, hemoglobin  14.5, platelets 240.  Urinalysis within normal limits except for trace  leukocyte esterase, zero to two white blood cells and rare bacteria on  micro.  Pregnancy test negative.  Lipase 21.  CMP within normal limits  except for glucose of 109.  LFTs within normal limits.   IMAGING:  Done  yesterday in the ER revealing CT abdomen and pelvis with  contrast showing inflammatory process of the ascending colon where  significant wall thickening and pericolic inflammatory changes are  identified, question diverticulitis versus colitis.  Pelvis:  Small  amount of nonspecific free pelvic fluid, normal appendix.  Status post  cholecystectomy.   REVIEW OF SYSTEMS:  The patient denies headache, vision changes, redness  in the eyes, urinary changes, fevers, myalgias, arthralgias or leg  swelling.  She does endorse chills and bumpy rash to the face 1 week ago  that resolved with Benadryl.  Otherwise per HPI.   ASSESSMENT AND PLAN:  This is a 40 year old female with no significant  past medical history other than some surgeries in the past who presents  with a 3 day history of intractable nausea and vomiting, now unable to  take p.o. and diarrhea with loose stool with blood streaks who was found  to have colitis on CT scan.   1. Colitis check Clostridium difficile, question if just viral      gastroenteritis as normally we do not get CT scans for this and may      have changes associated with it.  Check stool cultures regardless      and fecal lactoferrin.  Admit to hospital as this is patient's      preference and give her IV fluid rehydration gently.  Also provide      IV antibiotics in the form of ciprofloxacin and reassess in the      morning.  If not improving consider Gastroenterology consult for a      healthy 40 year old female with otherwise unexplained colitis but      no obvious sequela of inflammatory bowel disease.  Holding off on      continuing the Flagyl for now as it is not consistent with      diverticulitis - no left lower quadrant pain and wrong age group.  2. Nausea and vomiting.  Admit for hopefully 24 hour observation,      intravenous fluids and intravenous antiemetics and proton pump      inhibitor.  3. Prophylaxis, sequential compression devices and proton  pump      inhibitor.  4. History of bacterial vaginosis, should have completed Flagyl 7-day      course.  5. FENGI check CMP and TSH as well as CBC in morning, so far normal.      Clear diet ad lib, IV fluids.  6. Disposition, pending resolution  of #1 and #2.      Eustaquio Boyden, MD  Electronically Signed      Pearlean Brownie, M.D.  Electronically Signed    JG/MEDQ  D:  01/08/2009  T:  01/08/2009  Job:  [id number]

## 2010-12-15 NOTE — Op Note (Signed)
NAMECAROLEEN, STOERMER           ACCOUNT NO.:  [id number]   MEDICAL RECORD NO.:  [id number]          PATIENT TYPE:  INP   LOCATION:  9320                          FACILITY:  [facility]   PHYSICIAN:  Janine Limbo, M.D.DATE OF BIRTH:  1971-02-27   DATE OF PROCEDURE:  02/22/2007  DATE OF DISCHARGE:                               OPERATIVE REPORT   PREOPERATIVE DIAGNOSIS:  1. Fibroid uterus.  2. Dysmenorrhea.  3. Pelvic pain.  4. Dyspareunia.  5. Obesity (weight 219 pounds, height 5 feet 5 inches).   POSTOPERATIVE DIAGNOSIS:  1. Fibroid uterus.  2. Dysmenorrhea.  3. Pelvic pain.  4. Dyspareunia.  5. Obesity (weight 219 pounds, height 5 feet 5 inches).  6. Pelvic adhesions.  7. Hyperpigmentation/rule out endometriosis.   PROCEDURE:  1. Laparoscopy assisted vaginal hysterectomy.  2. Laparoscopic lysis of adhesions.  3. Laparoscopic pelvic biopsies.   SURGEON:  Dr. Leonard Schwartz   FIRST ASSISTANT:  Naima A. Normand Sloop, M.D.   ANESTHETIC:  General.   DISPOSITION:  Ms. Olesky is a 40 year old female, para 5-1-2-6, who  presents with the above-mentioned diagnosis.  The patient understands  the indications for her surgical procedure and she accepts the risks of,  but not limited to, anesthetic complications, bleeding, infection, and  possible damage to surrounding organs.  She understands that no  guarantees can be given concerning the total relief of her pelvic pain.   FINDINGS:  The uterus was 8-10 weeks size and the fibroids were noted.  The fibroid measured approximately 2 cm in size.  The estimated weight  of the uterus was less than 200 grams.  The fallopian tubes and ovaries  appeared normal except for defects from the prior tubal ligation.  The  anterior cul-de-sac was free.  In the posterior cul-de-sac there were  adhesions between the bowel and the posterior uterus.  There was a  hyperpigmented area in the left posterior cul-de-sac that was suspicious  for  endometriosis.  It measured approximately 0.5 cm in size.  There was  a 1 cm free-floating mass that was yellow in color and smooth on its  surface.  This was consistent with an adipose mass that had disconnected  from the bowel.  The appendix appeared normal.  The bowel appeared  normal.  The upper abdomen appeared normal except that there was no  evidence of the gallbladder.   PROCEDURE:  The patient was taken to the operating room where a general  anesthetic was given.  The patient's abdomen, perineum, and vagina were  prepped with multiple layers of Betadine.  Examination under anesthesia  was performed.  A Hulka tenaculum was placed inside the uterus.  The  patient was then sterilely draped.  The subumbilical area was injected  with 6 mL of half percent Marcaine with epinephrine.  An incision was  made in the subumbilical area and carried sharply through the  subcutaneous tissue, fascia, and the anterior peritoneum.  The Hassan  cannula was sutured into place.  A pneumoperitoneum was obtained.  The  pelvis was visualized with findings as mentioned above.  The left lower  abdomen was  injected in two separate areas with a total of 4 mL of half  percent Marcaine with epinephrine.  Two 5 mm trocars were placed in the  lower abdomen under direct visualization.  One trocar was placed in the  left of midline and one trocar was placed to the right of midline.  Pictures were taken of the patient's pelvic and abdominal anatomy.  We  used a combination of sharp and blunt dissection to remove the adhesions  from the posterior cul-de-sac.  A biopsy was taken of the hyperpigmented  lesion and the left posterior cul-de-sac and sent to pathology.  The  free-floating mass in the posterior cul-de-sac was sent to pathology for  evaluation.  At this point we felt that we were ready to proceed with  the vaginal portion of the procedure.  The patient was placed in a more  lithotomy position.  The cervix  was injected with a diluted solution of  Pitressin and saline.  A circumferential incision was made around the  cervix and the vaginal mucosa was advanced anteriorly and posteriorly.  The anterior cul-de-sac was sharply entered.  The posterior cul-de-sac  was sharply entered.  Alternating from right to left uterosacral  ligaments, paracervical tissues, parametrial tissues, and the uterine  arteries were clamped, cut, sutured, and tied securely.  The uterus was  inverted through the posterior colpotomy.  The upper pedicles were  secured and the uterus was transected from the operative field.  The  uterus was sent to pathology for evaluation.  The upper pedicles were  then free tied and then suture ligated.  Hemostasis was adequate.  The  sutures attached to the uterosacral ligaments were brought out through  the vaginal angles and tied securely.  A McCall culdoplasty suture was  placed in the posterior cul-de-sac incorporating the uterosacral  ligaments bilaterally and the posterior peritoneum.  A final check was  made for hemostasis and again hemostasis was confirmed.  The vaginal  cuff was closed using figure-of-eight sutures incorporating the anterior  vaginal mucosa, the anterior peritoneum, the posterior peritoneum, and  the posterior vaginal mucosa.  The McCall culdoplasty suture was tied  securely and the apex of the vagina was noted to elevate into the  pelvis.  The operator then changed gown and gloves.  A pneumoperitoneum  was reestablished in the abdomen.  The pelvis was carefully inspected  and there was no evidence of damage to the bowel or to the other vital  pelvic structures.  Hemostasis was confirmed.  We felt that we were  ready to end our procedure.  The pneumoperitoneum was allowed to escape.  The trocars were removed under direct visualization.  The Hassan cannula  was removed.  The subumbilical fascia was closed using interrupted  sutures.  The subcutaneous layer was  closed using interrupted sutures.  The skin was reapproximated using a subcuticular suture of 4-0 Monocryl.  The lower abdominal incisions were closed using subcuticular suture of 4-  0 Monocryl.  Sponge, needle, instrument counts were correct on two  occasions.  The estimated blood loss for the procedure was 100 mL.  The  estimated urine output was 550 mL and her urine was noted to be clear.  The patient tolerated her procedure well.  She was awakened from her  anesthetic without difficulty and taken to the recovery room in stable  condition.  The uterus was sent to pathology.  The pelvic mass was sent  to pathology.  The pelvic biopsy was sent  to pathology.      Janine Limbo, M.D.  Electronically Signed     AVS/MEDQ  D:  02/22/2007  T:  02/23/2007  Job:  [id number]

## 2010-12-18 NOTE — Discharge Summary (Signed)
[facility]  Patient:    Angela Cisneros, Angela Cisneros                  MRN: [id number] Adm. Date:  [id number] Disc. Date: 12/06/00 Attending:  McDiarmid, Leighton Roach. Dictator:   Zella Ball, M.D.                           Discharge Summary  DISCHARGE DIAGNOSES: 1. Cholelithiasis. 2. DD:  12/06/00 TD:  12/06/00 Job: [id number]

## 2010-12-18 NOTE — Discharge Summary (Signed)
Angela, Cisneros           ACCOUNT NO.:  [id number]   MEDICAL RECORD NO.:  [id number]          PATIENT TYPE:  INP   LOCATION:  9320                          FACILITY:  [facility]   PHYSICIAN:  Janine Limbo, M.D.DATE OF BIRTH:  April 12, 1971   DATE OF ADMISSION:  02/22/2007  DATE OF DISCHARGE:  02/23/2007                               DISCHARGE SUMMARY   DISCHARGE DIAGNOSIS:  Symptomatic uterine fibroids, dysmenorrhea, pelvic  pain, dyspareunia and pelvic adhesions.   OPERATION:  On the day on the date of admission the patient underwent a  laparoscopically-assisted vaginal hysterectomy with lysis of adhesions  and cul-de-sac biopsies tolerating procedures well.   HISTORY OF PRESENT ILLNESS:  Ms. Angela Cisneros is a 40 year old female para  5-1-2-6 who presents for a laparoscopically-assisted hysterectomy  because of symptomatic uterine fibroids. Please see dictated history and  physical examination for details.   PREOPERATIVE PHYSICAL EXAM:  VITAL SIGNS:  Weight 219 pounds, height 5  feet 5 inches.  GENERAL:  Within normal limits.  PELVIC:  External genitalia normal, vagina normal, cervix nontender.  Uterus upper limits of normal size, nontender.   HOSPITAL COURSE:  On the date of admission the patient underwent  aforementioned procedures tolerating them all well.  Postoperative  course was unremarkable with patient resuming bowel and bladder function  by postop day #1 and therefore deemed ready for discharge home.  Discharge hemoglobin 13.5 (preoperative hemoglobin of 14.3).   DISCHARGE MEDICATIONS:  Phenergan 25 mg q 6 hours prn nausea.  Motrin 800 mg q 8 hours prn mild to moderate pain.  Vicodin 1 or 2 tabs q 4 hours prn severe pain.   FOLLOW UP:  The patient is scheduled for 6 weeks postoperative visit  with Dr. Stefano Gaul. She was instructed to call [phone] for appointment  time and date.   INSTRUCTIONS:  The patient was given a copy of [facility]  postoperative instruction sheet.  She was further advised to avoid  driving for 1-2 weeks, heavy lifting for 4 weeks, intercourse for 6  weeks, that she may shower, walk up steps and should increase her  activities slowly.  The patient's diet was without restriction.   FINAL PATHOLOGY:  Uterus, hysterectomy:  Benign cervix with no  significant abnormalities, benign secretory endometrium, no hyperplasia  identified and leiomyomata.  Posterior cul-de-sac biopsy:  Foreign  material associated with minimal inflammation and fibrosis.  Posterior  cul-de-sac mass biopsy:  Benign fibrocalcific nodules.      Angela Cisneros.      Janine Limbo, M.D.  Electronically Signed    EJP/MEDQ  D:  03/27/2007  T:  03/27/2007  Job:  [id number]

## 2010-12-18 NOTE — Op Note (Signed)
[place]. [facility]  Patient:    Angela Cisneros, Angela Cisneros                    MRN: [id number] Proc. Date: 12/05/00 Attending:  Jimmye Norman, M.D.                           Operative Report  PREOPERATIVE DIAGNOSIS:  POSTOPERATIVE DIAGNOSIS:  OPERATION PERFORMED:  SURGEON:  Jimmye Norman, M.D.  ASSISTANT:  None.  ANESTHESIA:  General endotracheal.  ESTIMATED BLOOD LOSS:  Less than from 30 to 50 cc.  COMPLICATIONS:  None.  CONDITION:  Stable.  OPERATIVE FINDINGS:  An acutely inflamed gallbladder with large stones.  INDICATIONS FOR PROCEDURE:  The patient is a 40 year old with an acute cholecystitis on ultrasound and stones on recent ultrasound and she now comes in for a laparoscopic cholecystectomy.  The intraoperative cholangiogram was negative.  DESCRIPTION OF PROCEDURE:  The patient was taken to the operating room and placed on the table in supine position.  After an adequate general anesthetic was administered, she was prepped and draped in the usual sterile manner exposing the midline and the right upper quadrant of the abdomen.  A superumbilical curvilinear incision was made using a #11 blade and taken down to the midline fascia.  It was through this that a Veress needle was passed into the peritoneal cavity while tenting up on the anterior abdominal wall.  It was confirmed to be in adequate position using the saline test. Once this was done, carbon dioxide insufflation was instilled into the peritoneal cavity up to a maximal intra-abdominal pressure of 15 mmHg.  Once this was done, a 11-12 mm cannula was passed into the peritoneal cavity and confirmed to be in position using the laparoscope with attached camera and light source.  Once this was done and confirmed to be in good position, two right costal margin 5 mm cannulas and a subxiphoid 10-11 mm cannula was passed into the peritoneal cavity under direct vision.  Once this was done, the patient  was placed in steeper reversed Trendelenburg position.  The left side was tilted down and the dissection begun.  The gallbladder had to be aspirated because of tenseness.  White bile was aspirated up to approximately 30 cc.  We then grasped the gallbladder using a ratcheted grasper through the lateral most cannula.  We retracted it towards the anterior abdominal wall and to the right upper quadrant.  The infundibulum was grasped with a second grasper.  We then dissected out the peritoneum overlying the gallbladder triangle of Calot and the hepatoduodenal triangle. This allowed [place] to isolate the cystic duct and the cystic artery.  A cholangiogram was performed through the cystic duct after clamping along the gallbladder side.  It showed good flow into the duodenum, no  evidence of common duct stones but the common duct did appear to be mildly enlarged.  The cystic duct and cystic artery were subsequently transected after removing the cholangiocatheter and clipping the duct with triple clips on the distal side. We dissected the gallbladder out of its bed using electrocautery and a spatula dissector.  We then used an Endocatch back to bring the gallbladder out through the superumbilical incision with minimal difficulty.  We irrigated the right upper quadrant with saline solution and then cauterized the bed for hemostasis.  Once this was done, all gas allowed to escape through the cannulas.  We aspirated with saline irrigation of the  right upper quadrant and then we closed.  The superumbilical fascia was closed using a figure-of-eight stitch of 0 Vicryl.  Then the skin was closed using running subcuticular stitch of 4-0 Vicryl.  This was done at all subcuticular sites.  0.25% Marcaine with epinephrine was injected at all sites.  Sponge, needle and instrument counts were correct. DD:  12/05/00 TD:  12/05/00 Job: [id number]

## 2010-12-18 NOTE — Consult Note (Signed)
[place]. [facility]  Patient:    Angela Cisneros, Angela Cisneros                  MRN: [id number] Proc. Date: 12/04/00 Adm. Date:  [id number] Attending:  McDiarmid, Leighton Roach CC:         Jimmye Norman, M.D.  Mena Pauls, M.D.   Consultation Report  HISTORY OF PRESENT ILLNESS:  Patient with 24 hours of abdominal pain with some nausea and vomiting but no fever presented to the emergency room yesterday.  I was called by Dr. Read Drivers.  Her liver tests were normal.  She had gallstones with one impacted in the neck of the gallbladder and there was a vague question of a CBD stone, and after my discussion with Dr. Read Drivers we decided on a surgical consultation first.  Dr. Ezzard Standing was called and seemingly the patient was asymptomatic at that juncture and Dr. Ezzard Standing had agreed to see the patient in his office on Monday, which I thought was appropriate; however, the patient was never discharged from the emergency room.  Her pain seemed to continue and she was admitted by the family practice team and I was called to see the patient this morning.  Her symptoms really have not changed.  She has had periodic abdominal pain helped by Prevacid over the years since her first child.  No workup had been done.  Prevacid seemed to help and she was told she had reflux.  PAST MEDICAL HISTORY:  Tubal ligation and headaches but no other chronic medical problems.  SOCIAL HISTORY:  She drinks occasionally, rare marijuana use.  She does not do any other drugs or much over-the-counter medicines.  She does not smoke.  FAMILY HISTORY:  Negative for any obvious GI problems.  HOME MEDICATIONS:  None.  ALLERGIES:  There are no known allergies.  REVIEW OF SYSTEMS:  Pertinent for no recent problem or complaints until the pain started.  PHYSICAL EXAMINATION:  GENERAL:  Sleeping comfortably in the bed.  VITAL SIGNS:  Stable.  Afebrile.  HEENT:  Sclerae non-icteric.  ABDOMEN:  Pertinent for  being tender in the right upper quadrant and midepigastric areas.  She is soft with good bowel sounds otherwise.  There is no rebound tenderness.  LABORATORY DATA:  Amylase and lipase are normal.  CMET yesterday was normal including all liver tests, albumin, BUN, and creatinine.  Her CBC was totally normal yesterday and not repeated today.  There was no left shift or white count elevation and her liver tests on repeat today were normal as well.  Ultrasound was reviewed with Dr. Boston Service.  We really questioned the diagnosis of CBD stones on the ultrasound, very difficult to say for sure, and it was questioned on the initial report as well.  ASSESSMENT:  Gallstones and pain with normal liver tests and a question of common bile duct stones.  If okay with the surgical team, based on the normal liver tests and persistent pain and one stone being impacted in the neck of the gallbladder, my guess is her gallbladder stones are causing her symptoms, and, if there are common bile duct stones, they are probably asymptomatic with a normal liver test and normal size duct.  Therefore, my plan is I prefer to proceed with surgical options and do a laparoscopic cholecystectomy with an intraoperative cholangiogram and then, if common bile duct stones are found, I would proceed with an ERCP.  Certainly, if ERCP requested by the surgeons first, would yield  to their preference.  Possibly, we could do a PIPIDA scan if there is any question about trying to delineate the anatomy further, but I do not feel that that is necessary.  I discussed the risks, benefits, methods of ERCP, sphincterotomy, cutting the duct, and removing stones versus laparoscopic cholecystectomy versus the old-fashioned open cholecystectomy with the patient and we will be on standby to hear surgicals opinion.  Please page me if I can be of any further assistance or if intraoperative cholangiogram positive.  Thank you very much for allowing  me to see the patient. DD:  12/04/00 TD:  12/05/00 Job: [id number]

## 2010-12-18 NOTE — Discharge Summary (Signed)
[place]. [facility]  Patient:    Angela Cisneros, Angela Cisneros                  MRN: [id number] Adm. Date:  [id number] Disc. Date: [id number] Attending:  McDiarmid, Leighton Roach. Dictator:   Zella Ball, M.D. CC:         [facility]   Discharge Summary  DISCHARGE DIAGNOSES: 1. Biliary colic. 2. Cholelithiasis.  DISCHARGE MEDICATION:  Vicodin p.r.n.  ACTIVITY:  No heavy lifting for one month.  DIET:  No restrictions.  WOUND CARE:  Shower, do not take a bath, pat wounds dry.  FOLLOWUP:  The patient is to follow up with Dr. Lindie Spruce in two weeks.  Follow up at the [facility] in the two weeks to establish care there.  CONSULTATIONS: 1. Dr. Lindie Spruce, surgery. 2. Dr. Ewing Schlein, gastroenterology.  PROCEDURE:  Cholecystectomy with intraoperative cholangiogram performed by Dr. Lindie Spruce.  For full description of procedure, please see his dictation.  HISTORY OF PRESENT ILLNESS:  This is a 40 year old, African-American female who presented with basically an eight-hour history of very severe abdominal pain accompanied by nausea and several episodes of vomiting.  The patient reported that she had been doing fine in her normal state of health until she ate a fried fish sandwich for lunch and then just shortly after that had excruciating pain and decided to present to the emergency department.  She does report a history of stomach pain actually over the past several years which she would often get after eating spicy foods.  She had been on Prevacid as an outpatient which had been prescribed to her by a previous physician thinking that this was some kind of stomach acid issue.  PAST MEDICAL HISTORY: 1. Bilateral tubal ligation in 1996. 2. Migraine headaches followed by [facility].  DISCHARGE MEDICATIONS: 1. Prevacid p.r.n. 2. Demerol p.r.n.  PHYSICAL EXAMINATION:  VITAL SIGNS:  Afebrile at 97.0, pulse 62, blood pressure 124/76,  respiratory rate 16, saturating 99% on room air.  GENERAL:  This was an in pain appearing, young, African-American female intermittently riving on the bed.  CARDIOVASCULAR:  Tachycardic during exam with regular rhythm.  LUNGS:  Clear to auscultation bilaterally.  ABDOMEN:  Positive bowel sounds, no guarding, but very tender in the right upper quadrant.  This is after having received IV pain medications in the emergency department.  LABORATORY DATA AND X-RAY FINDINGS:  Sodium 136, potassium 3.7, chloride 101, bicarb 28, BUN 8, creatinine 0.7, glucose 128.  ALT 24, AST 25, total protein 8, Alk phos 101, total bilirubin 0.5.  Amylase 89, lipase 22.  White count 5.7 with 69% polys, 26% lymphs, hemoglobin 14.9, platelets 286.  UA showed specific gravity of 1.014 and trace LE, otherwise negative.  Microscopic examination of the urine showed a few bacteria and less than 5 wbcs and rbcs.  Abdominal ultrasound found gallstones with a gallstone in what appeared to be in the neck of the gallbladder with evidence of a stone in the common bile duct.  There was some mild gallbladder dilation, but no evidence of wall edema.  At the time, the patient was assessed as having acute biliary colic which had not progressed to acute cholecystitis.  Therefore, she was admitted initially for pain management and for likely outpatient followup of her biliary colic.  HOSPITAL COURSE:  The patient was admitted and started on IV morphine for pain and Zofran for nausea and kept NPO.  Overnight, the patient was  in persistent severe pain.  Therefore, on the morning of May 5, it was decided to consult surgery and Dr. Lindie Spruce, who on verbal communication with the report of the possibility of stones in the common bile duct suggested that we consult gastroenterology for their opinion on whether the patient might need an ERCP. We spoke with Dr. Ewing Schlein about this and he reviewed the patients ultrasound. His feeling was  that given lack of increase in patients liver function test, that likely the possibility of gallstones was more likely and artifact of the reading and not true intraductal gallstones.  Therefore, it was his suggestion that surgery go ahead and take out the patients gallbladder and if any evidence of stones were found on intraoperative cholangiogram that this could be followed up as ERCP after the surgery.  Therefore, Dr. Lindie Spruce went ahead and performed the cholecystectomy on Dec 04, 2000.  Please see his dictation for that surgery.  The patient recovered without much issue.  She did have slight postoperative fever to 101 degrees which was attributed to atelectasis.  On the day of discharge, she had had a bowel movement, was eating normally and was afebrile at the time of discharge. DD:  12/06/00 TD:  12/07/00 Job: [id number]

## 2011-03-10 ENCOUNTER — Emergency Department (HOSPITAL_COMMUNITY)
Admission: EM | Admit: 2011-03-10 | Discharge: 2011-03-10 | Disposition: A | Discharge status: Home / Self Care | Payer: BC Managed Care – PPO | Attending: Emergency Medicine | Admitting: Emergency Medicine

## 2011-03-10 ENCOUNTER — Emergency Department (HOSPITAL_COMMUNITY): Payer: BC Managed Care – PPO

## 2011-03-10 DIAGNOSIS — R1110 Vomiting, unspecified: Secondary | ICD-10-CM | POA: Insufficient documentation

## 2011-03-10 DIAGNOSIS — R070 Pain in throat: Secondary | ICD-10-CM | POA: Insufficient documentation

## 2011-03-10 DIAGNOSIS — R05 Cough: Secondary | ICD-10-CM | POA: Insufficient documentation

## 2011-03-10 DIAGNOSIS — Z9889 Other specified postprocedural states: Secondary | ICD-10-CM | POA: Insufficient documentation

## 2011-03-10 DIAGNOSIS — R059 Cough, unspecified: Secondary | ICD-10-CM | POA: Insufficient documentation

## 2011-03-10 DIAGNOSIS — I10 Essential (primary) hypertension: Secondary | ICD-10-CM | POA: Insufficient documentation

## 2011-03-10 DIAGNOSIS — T7840XA Allergy, unspecified, initial encounter: Secondary | ICD-10-CM | POA: Insufficient documentation

## 2011-05-07 LAB — POCT PREGNANCY, URINE: Preg Test, Ur: NEGATIVE

## 2011-05-07 LAB — I-STAT 8, (EC8 V) (CONVERTED LAB)
BUN: 12
Bicarbonate: 28.3 — ABNORMAL HIGH
Chloride: 104
HCT: 45
Hemoglobin: 15.3 — ABNORMAL HIGH
Operator id: 270961
Sodium: 138

## 2011-05-07 LAB — POCT URINALYSIS DIP (DEVICE)
Glucose, UA: NEGATIVE
Ketones, ur: NEGATIVE
Operator id: 270961
Specific Gravity, Urine: 1.025

## 2011-05-07 LAB — DIFFERENTIAL
Lymphocytes Relative: 43
Lymphs Abs: 2.5
Neutrophils Relative %: 51

## 2011-05-07 LAB — OCCULT BLOOD X 1 CARD TO LAB, STOOL: Fecal Occult Bld: NEGATIVE

## 2011-05-07 LAB — CBC
Platelets: 282
WBC: 5.7

## 2011-05-07 LAB — POCT I-STAT CREATININE
Creatinine, Ser: 0.9
Operator id: 270961

## 2011-05-17 LAB — BASIC METABOLIC PANEL
CO2: 27
Chloride: 103
Glucose, Bld: 103 — ABNORMAL HIGH
Potassium: 3.3 — ABNORMAL LOW
Sodium: 137

## 2011-05-17 LAB — URINALYSIS, ROUTINE W REFLEX MICROSCOPIC
Ketones, ur: NEGATIVE
Nitrite: NEGATIVE
Protein, ur: NEGATIVE
Urobilinogen, UA: 0.2
pH: 5.5

## 2011-05-17 LAB — CBC
HCT: 39.9
HCT: 41.7
Hemoglobin: 14.3
MCHC: 34.2
MCHC: 34.3
MCV: 88.9
Platelets: 339
RBC: 4.74
RDW: 13.7
RDW: 13.3
RDW: 13.7

## 2011-05-17 LAB — DIFFERENTIAL
Basophils Absolute: 0.1
Basophils Relative: 1
Eosinophils Relative: 1
Lymphocytes Relative: 37

## 2011-05-17 LAB — URINE MICROSCOPIC-ADD ON

## 2011-05-20 LAB — URINALYSIS, ROUTINE W REFLEX MICROSCOPIC
Glucose, UA: NEGATIVE
Ketones, ur: NEGATIVE
Nitrite: NEGATIVE
Protein, ur: NEGATIVE
pH: 6

## 2011-05-20 LAB — WET PREP, GENITAL

## 2011-05-20 LAB — CBC
MCHC: 33.9
Platelets: 300
RBC: 4.6
WBC: 7.7

## 2011-05-20 LAB — POCT PREGNANCY, URINE: Preg Test, Ur: NEGATIVE

## 2011-05-20 LAB — GC/CHLAMYDIA PROBE AMP, GENITAL: GC Probe Amp, Genital: NEGATIVE

## 2011-07-14 ENCOUNTER — Emergency Department (INDEPENDENT_AMBULATORY_CARE_PROVIDER_SITE_OTHER)
Admission: EM | Admit: 2011-07-14 | Discharge: 2011-07-14 | Disposition: A | Discharge status: Home / Self Care | Payer: BC Managed Care – PPO | Source: Home / Self Care | Attending: Emergency Medicine | Admitting: Emergency Medicine

## 2011-07-14 ENCOUNTER — Encounter: Payer: Self-pay | Admitting: Emergency Medicine

## 2011-07-14 DIAGNOSIS — R6889 Other general symptoms and signs: Secondary | ICD-10-CM

## 2011-07-14 HISTORY — DX: Essential (primary) hypertension: I10

## 2011-07-14 MED ORDER — ALBUTEROL SULFATE HFA 108 (90 BASE) MCG/ACT IN AERS: 1.0000 | INHALATION_SPRAY | Freq: Four times a day (QID) | RESPIRATORY_TRACT | Status: DC | PRN

## 2011-07-14 MED ORDER — FLUTICASONE PROPIONATE 50 MCG/ACT NA SUSP: 2.0000 | Freq: Every day | NASAL | Status: DC

## 2011-07-14 MED ORDER — HYDROCODONE-ACETAMINOPHEN 5-325 MG PO TABS: 2.0000 | ORAL_TABLET | ORAL | Status: AC | PRN

## 2011-07-14 MED ORDER — IBUPROFEN 600 MG PO TABS: 600.0000 mg | ORAL_TABLET | Freq: Four times a day (QID) | ORAL | Status: AC | PRN

## 2011-07-14 NOTE — ED Provider Notes (Signed)
History     CSN: [id number] Arrival date & time: 07/14/2011 12:29 PM   First MD Initiated Contact with Patient 07/14/11 1241      Chief Complaint  Patient presents with  . Influenza  . Cough    (Consider location/radiation/quality/duration/timing/severity/associated sxs/prior treatment) HPI Comments: Pt with rhinorrhea, postnasal drip, ST, nonproductive cough, fatigue, bodyaches x 2 days. States feels "hot" but no measured fevers at home. Reports chills. Unable to sleep at night secondary to coughing. Had diffuse crampy abd pain followed with nonbloody loose stool after eating last night but no other episodes of diarrhea.  No ear pain, abd pain, wheeze, SOB, abd pain, rash, N/V. Decreased appetite but is tolerating po.  Did not get flu shot. 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   Review of Systems: Positive for fatigue, mild nasal congestion, mild sore throat, mild swollen anterior neck glands, mild cough, diarrhea. Negative for acute vision changes, stiff neck, focal weakness, syncope, seizures, respiratory distress, vomiting, GU symptoms.    Patient is a 40 y.o. female presenting with flu symptoms and cough.  Influenza  Cough    Past Medical History  Diagnosis Date  . Diabetes mellitus   . Hypertension     Past Surgical History  Procedure Date  . Abdominal hysterectomy     History reviewed. No pertinent family history.  History  Substance Use Topics  . Smoking status: Never Smoker   . Smokeless tobacco: Not on file  . Alcohol Use:     OB History    Grav Para Term Preterm Abortions TAB SAB Ect Mult Living                  Review of Systems  Respiratory: Positive for cough.     Allergies  Review of patient's allergies indicates no known allergies.  Home Medications   Current Outpatient Rx  Name Route Sig Dispense Refill  . METFORMIN HCL 500 MG PO TABS Oral Take 500 mg by mouth 2 (two) times daily with a meal.      . PRESCRIPTION MEDICATION  PT IS TAKING  BP MED FOR HTN BUT UNSURE OF NAME     . ALBUTEROL SULFATE HFA 108 (90 BASE) MCG/ACT IN AERS Inhalation Inhale 1-2 puffs into the lungs every 6 (six) hours as needed for wheezing. 1 Inhaler 0  . FLUTICASONE PROPIONATE 50 MCG/ACT NA SUSP Nasal Place 2 sprays into the nose daily. 16 g 2  . HYDROCODONE-ACETAMINOPHEN 5-325 MG PO TABS Oral Take 2 tablets by mouth every 4 (four) hours as needed for pain. 20 tablet 0  . IBUPROFEN 600 MG PO TABS Oral Take 1 tablet (600 mg total) by mouth every 6 (six) hours as needed for pain. 30 tablet 0    BP 132/86  Pulse 60  Temp(Src) 97.6 F (36.4 C) (Oral)  Resp 18  SpO2 100%  Physical Exam  Constitutional: She is oriented to person, place, and time. She appears well-developed and well-nourished. No distress.       Appears tired   HENT:  Head: Normocephalic and atraumatic.  Right Ear: Tympanic membrane normal.  Left Ear: Tympanic membrane normal.  Nose: Mucosal edema and rhinorrhea present. Right sinus exhibits no maxillary sinus tenderness and no frontal sinus tenderness. Left sinus exhibits no maxillary sinus tenderness and no frontal sinus tenderness.  Mouth/Throat: Oropharynx is clear and moist and mucous membranes are normal. No oropharyngeal exudate.  Eyes: Conjunctivae and EOM are normal. Pupils are equal, round, and reactive to  light.  Neck: Normal range of motion. Neck supple.  Cardiovascular: Normal rate, regular rhythm, normal heart sounds and intact distal pulses.   Pulmonary/Chest: Effort normal and breath sounds normal. No respiratory distress.  Abdominal: Soft. Bowel sounds are normal. She exhibits no distension. There is no tenderness. There is no rebound and no guarding.  Neurological: She is alert and oriented to person, place, and time.  Skin: Skin is warm and dry. No rash noted.  Psychiatric: She has a normal mood and affect.    ED Course  Procedures (including critical care time)  Labs Reviewed - No data to display No results  found.   1. Flu-like symptoms       MDM  Pt appears to be in NAD. VSS. Pt non-toxic appearing. No evidence of pharyngitis or OM. No evidence of neck stiffness or other sx to support meningitis. No evidence of dehydration. Abd S/NT/ND without peritoneal sx. Doubt intraabdominal process. No evidence of PNA or UTI. Pt able to tolerate PO. Pt with viral syndrome, probable flu. Will treat symptomatically and have pt f/u with PCP PRN   Luiz Blare, MD 07/14/11 1409

## 2011-07-14 NOTE — ED Notes (Signed)
PT HERE WITH C/O FLU LIKE SX WITHOUT FEVERS THAT STARTED ON Monday WITH COUGH AND NOW HAS PROGRESSED TO WEAKNESS,BODY ACHES AND DIARRHEA,POOR APPETITE.R UPPER LOBE DECREASED BUT CLEAR.SATS 98% R/A AND AFEBRILE.PT HAS BEEN TAKING ALEVE FOR SX.

## 2011-12-19 ENCOUNTER — Emergency Department (HOSPITAL_COMMUNITY)
Admission: EM | Admit: 2011-12-19 | Discharge: 2011-12-19 | Disposition: A | Discharge status: Home / Self Care | Payer: BC Managed Care – PPO | Source: Home / Self Care

## 2011-12-20 ENCOUNTER — Inpatient Hospital Stay (HOSPITAL_COMMUNITY)
Admission: AD | Admit: 2011-12-20 | Discharge: 2011-12-20 | Disposition: A | Discharge status: Home / Self Care | Payer: BC Managed Care – PPO | Source: Ambulatory Visit | Attending: Obstetrics & Gynecology | Admitting: Obstetrics & Gynecology

## 2011-12-20 ENCOUNTER — Encounter (HOSPITAL_COMMUNITY): Payer: Self-pay | Admitting: *Deleted

## 2011-12-20 DIAGNOSIS — N760 Acute vaginitis: Secondary | ICD-10-CM | POA: Insufficient documentation

## 2011-12-20 DIAGNOSIS — B9689 Other specified bacterial agents as the cause of diseases classified elsewhere: Secondary | ICD-10-CM | POA: Insufficient documentation

## 2011-12-20 DIAGNOSIS — A499 Bacterial infection, unspecified: Secondary | ICD-10-CM | POA: Insufficient documentation

## 2011-12-20 DIAGNOSIS — R300 Dysuria: Secondary | ICD-10-CM | POA: Insufficient documentation

## 2011-12-20 DIAGNOSIS — N390 Urinary tract infection, site not specified: Secondary | ICD-10-CM

## 2011-12-20 LAB — URINALYSIS, ROUTINE W REFLEX MICROSCOPIC
Bilirubin Urine: NEGATIVE
Leukocytes, UA: NEGATIVE
Nitrite: POSITIVE — AB
Specific Gravity, Urine: 1.025 (ref 1.005–1.030)
Urobilinogen, UA: 1 mg/dL (ref 0.0–1.0)
pH: 6 (ref 5.0–8.0)

## 2011-12-20 LAB — URINE MICROSCOPIC-ADD ON

## 2011-12-20 LAB — WET PREP, GENITAL

## 2011-12-20 MED ORDER — TINIDAZOLE 500 MG PO TABS: 2.0000 g | ORAL_TABLET | Freq: Every day | ORAL | Status: AC

## 2011-12-20 MED ORDER — SULFAMETHOXAZOLE-TMP DS 800-160 MG PO TABS: 1.0000 | ORAL_TABLET | Freq: Two times a day (BID) | ORAL | Status: AC

## 2011-12-20 MED ORDER — FLUCONAZOLE 150 MG PO TABS: 150.0000 mg | ORAL_TABLET | Freq: Once | ORAL | Status: AC

## 2011-12-20 NOTE — MAU Provider Note (Signed)
Attestation of Attending Supervision of Advanced Practitioner: Evaluation and management procedures were performed by the OB Fellow/PA/CNM/NP under my supervision and collaboration. Chart reviewed, and agree with management and plan.  Makina Skow, M.D. 12/20/2011 1:48 PM   

## 2011-12-20 NOTE — Discharge Instructions (Signed)
Bacterial Vaginosis Bacterial vaginosis (BV) is a vaginal infection where the normal balance of bacteria in the vagina is disrupted. The normal balance is then replaced by an overgrowth of certain bacteria. There are several different kinds of bacteria that can cause BV. BV is the most common vaginal infection in women of childbearing age. CAUSES   The cause of BV is not fully understood. BV develops when there is an increase or imbalance of harmful bacteria.   Some activities or behaviors can upset the normal balance of bacteria in the vagina and put women at increased risk including:   Having a new sex partner or multiple sex partners.   Douching.   Using an intrauterine device (IUD) for contraception.   It is not clear what role sexual activity plays in the development of BV. However, women that have never had sexual intercourse are rarely infected with BV.  Women do not get BV from toilet seats, bedding, swimming pools or from touching objects around them.  SYMPTOMS   Grey vaginal discharge.   A fish-like odor with discharge, especially after sexual intercourse.   Itching or burning of the vagina and vulva.   Burning or pain with urination.   Some women have no signs or symptoms at all.  DIAGNOSIS  Your caregiver must examine the vagina for signs of BV. Your caregiver will perform lab tests and look at the sample of vaginal fluid through a microscope. They will look for bacteria and abnormal cells (clue cells), a pH test higher than 4.5, and a positive amine test all associated with BV.  RISKS AND COMPLICATIONS   Pelvic inflammatory disease (PID).   Infections following gynecology surgery.   Developing HIV.   Developing herpes virus.  TREATMENT  Sometimes BV will clear up without treatment. However, all women with symptoms of BV should be treated to avoid complications, especially if gynecology surgery is planned. Female partners generally do not need to be treated. However,  BV may spread between female sex partners so treatment is helpful in preventing a recurrence of BV.   BV may be treated with antibiotics. The antibiotics come in either pill or vaginal cream forms. Either can be used with nonpregnant or pregnant women, but the recommended dosages differ. These antibiotics are not harmful to the baby.   BV can recur after treatment. If this happens, a second round of antibiotics will often be prescribed.   Treatment is important for pregnant women. If not treated, BV can cause a premature delivery, especially for a pregnant woman who had a premature birth in the past. All pregnant women who have symptoms of BV should be checked and treated.   For chronic reoccurrence of BV, treatment with a type of prescribed gel vaginally twice a week is helpful.  HOME CARE INSTRUCTIONS   Finish all medication as directed by your caregiver.   Do not have sex until treatment is completed.   Tell your sexual partner that you have a vaginal infection. They should see their caregiver and be treated if they have problems, such as a mild rash or itching.   Practice safe sex. Use condoms. Only have 1 sex partner.  PREVENTION  Basic prevention steps can help reduce the risk of upsetting the natural balance of bacteria in the vagina and developing BV:  Do not have sexual intercourse (be abstinent).   Do not douche.   Use all of the medicine prescribed for treatment of BV, even if the signs and symptoms go away.     Tell your sex partner if you have BV. That way, they can be treated, if needed, to prevent reoccurrence.  SEEK MEDICAL CARE IF:   Your symptoms are not improving after 3 days of treatment.   You have increased discharge, pain, or fever.  MAKE SURE YOU:   Understand these instructions.   Will watch your condition.   Will get help right away if you are not doing well or get worse.  FOR MORE INFORMATION  Division of STD Prevention (DSTDP), Centers for Disease  Control and Prevention: www.cdc.gov/std [employer] Association (ASHA): www.ashastd.org  Document Released: 07/19/2005 Document Revised: 07/08/2011 Document Reviewed: 01/09/2009 ExitCare Patient Information 2012 [employer].Urinary Tract Infection Infections of the urinary tract can start in several places. A bladder infection (cystitis), a kidney infection (pyelonephritis), and a prostate infection (prostatitis) are different types of urinary tract infections (UTIs). They usually get better if treated with medicines (antibiotics) that kill germs. Take all the medicine until it is gone. You or your child may feel better in a few days, but TAKE ALL MEDICINE or the infection may not respond and may become more difficult to treat. HOME CARE INSTRUCTIONS   Drink enough water and fluids to keep the urine clear or pale yellow. Cranberry juice is especially recommended, in addition to large amounts of water.   Avoid caffeine, tea, and carbonated beverages. They tend to irritate the bladder.   Alcohol may irritate the prostate.   Only take over-the-counter or prescription medicines for pain, discomfort, or fever as directed by your caregiver.  To prevent further infections:  Empty the bladder often. Avoid holding urine for long periods of time.   After a bowel movement, women should cleanse from front to back. Use each tissue only once.   Empty the bladder before and after sexual intercourse.  FINDING OUT THE RESULTS OF YOUR TEST Not all test results are available during your visit. If your or your child's test results are not back during the visit, make an appointment with your caregiver to find out the results. Do not assume everything is normal if you have not heard from your caregiver or the medical facility. It is important for you to follow up on all test results. SEEK MEDICAL CARE IF:   There is back pain.   Your baby is older than 3 months with a rectal temperature of 100.5  F (38.1 C) or higher for more than 1 day.   Your or your child's problems (symptoms) are no better in 3 days. Return sooner if you or your child is getting worse.  SEEK IMMEDIATE MEDICAL CARE IF:   There is severe back pain or lower abdominal pain.   You or your child develops chills.   You have a fever.   Your baby is older than 3 months with a rectal temperature of 102 F (38.9 C) or higher.   Your baby is 3 months old or younger with a rectal temperature of 100.4 F (38 C) or higher.   There is nausea or vomiting.   There is continued burning or discomfort with urination.  MAKE SURE YOU:   Understand these instructions.   Will watch your condition.   Will get help right away if you are not doing well or get worse.  Document Released: 04/28/2005 Document Revised: 07/08/2011 Document Reviewed: 12/01/2006 Exit[employer] Patient Information 2012 [employer]. 

## 2011-12-20 NOTE — MAU Note (Signed)
Patient states she has been having a little discomfort with urination for about one week. Has a little abdominal pain with urination. States she has been using OTC urinary analgesia that is not working. Patient has had a hysterectomy.

## 2011-12-20 NOTE — MAU Provider Note (Signed)
Chief Complaint:  Dysuria    First Provider Initiated Contact with Patient 12/20/11 1119      Angela Cisneros is  41 y.o. [id number].  No LMP recorded. Patient has had a hysterectomy..  She presents complaining of Dysuria  Patient states she has been having a little discomfort with urination for about one week. Has a little abdominal pain with urination. States she has been using OTC urinary analgesia that is not working.    Pt denies fever, chills, low back pain, nausea, vomiting or diarrhea.  Obstetrical/Gynecological History: OB History    Grav Para Term Preterm Abortions TAB SAB Ect Mult Living   6 6 5 1      6       Past Medical History: Past Medical History  Diagnosis Date  . Diabetes mellitus   . Hypertension     Past Surgical History: Past Surgical History  Procedure Date  . Abdominal hysterectomy   . Cholecystectomy   . Thyroid surgery   . Knee surgery   . Colonoscopy   . Polypectomy     Family History: Family History  Problem Relation Age of Onset  . Anesthesia problems Neg Hx   . Hypotension Neg Hx   . Malignant hyperthermia Neg Hx   . Pseudochol deficiency Neg Hx     Social History: History  Substance Use Topics  . Smoking status: Never Smoker   . Smokeless tobacco: Not on file  . Alcohol Use: No    Allergies: No Known Allergies  Prescriptions prior to admission  Medication Sig Dispense Refill  . CINNAMON PO Take 1 application by mouth daily. Patient takes ground cinnamon daily      . lisinopril (PRINIVIL,ZESTRIL) 10 MG tablet Take 10 mg by mouth daily. Called pharmacy and patient has not  Filled this prescription since last year      . metFORMIN (GLUCOPHAGE) 500 MG tablet Take 500 mg by mouth 2 (two) times daily with a meal.        . albuterol (PROVENTIL HFA;VENTOLIN HFA) 108 (90 BASE) MCG/ACT inhaler Inhale 1-2 puffs into the lungs every 6 (six) hours as needed for wheezing.  1 Inhaler  0    Review of Systems - Negative except what has  been reviewed in HPI  Physical Exam   Blood pressure 143/88, pulse 79, temperature 98.6 F (37 C), temperature source Oral, resp. rate 18, height 5\' 4"  (1.626 m), weight 213 lb (96.616 kg), SpO2 99.00%.  General: General appearance - alert, well appearing, and in no distress, oriented to person, place, and time and overweight Mental status - alert, oriented to person, place, and time, normal mood, behavior, speech, dress, motor activity, and thought processes, affect appropriate to mood Abdomen - soft, nontender, nondistended, no masses or organomegaly no CVA tenderness Focused Gynecological Exam: VULVA: normal appearing vulva with no masses, tenderness or lesions, VAGINA: vaginal discharge - white and malodorous, CERVIX: surgically absent, UTERUS: surgically absent, vaginal cuff well healed, ADNEXA: non palp  Labs: Recent Results (from the past 24 hour(s))  URINALYSIS, ROUTINE W REFLEX MICROSCOPIC   Collection Time   12/20/11 10:14 AM      Component Value Range   Color, Urine ORANGE (*) YELLOW    APPearance CLEAR  CLEAR    Specific Gravity, Urine 1.025  1.005 - 1.030    pH 6.0  5.0 - 8.0    Glucose, UA 100 (*) NEGATIVE (mg/dL)   Hgb urine dipstick NEGATIVE  NEGATIVE    Bilirubin Urine  NEGATIVE  NEGATIVE    Ketones, ur NEGATIVE  NEGATIVE (mg/dL)   Protein, ur NEGATIVE  NEGATIVE (mg/dL)   Urobilinogen, UA 1.0  0.0 - 1.0 (mg/dL)   Nitrite POSITIVE (*) NEGATIVE    Leukocytes, UA NEGATIVE  NEGATIVE   URINE MICROSCOPIC-ADD ON   Collection Time   12/20/11 10:14 AM      Component Value Range   Squamous Epithelial / LPF FEW (*) RARE    RBC / HPF 0-2  <3 (RBC/hpf)   Bacteria, UA MANY (*) RARE   WET PREP, GENITAL   Collection Time   12/20/11 11:21 AM      Component Value Range   Yeast Wet Prep HPF POC NONE SEEN  NONE SEEN    Trich, Wet Prep NONE SEEN  NONE SEEN    Clue Cells Wet Prep HPF POC FEW (*) NONE SEEN    WBC, Wet Prep HPF POC FEW (*) NONE SEEN    Assessment: 1. UTI (lower  urinary tract infection)   2. BV (bacterial vaginosis)    Plan: Discharge home Rx Bactrim, Tindamax, Diflucan to pharmacy FU with PCP prn  Gomer France E. 12/20/2011,12:09 PM

## 2011-12-21 LAB — GC/CHLAMYDIA PROBE AMP, GENITAL: Chlamydia, DNA Probe: NEGATIVE

## 2012-03-28 ENCOUNTER — Encounter (HOSPITAL_COMMUNITY): Payer: Self-pay

## 2012-03-28 ENCOUNTER — Emergency Department (HOSPITAL_COMMUNITY)
Admission: EM | Admit: 2012-03-28 | Discharge: 2012-03-28 | Disposition: A | Discharge status: Home / Self Care | Payer: BC Managed Care – PPO | Attending: Emergency Medicine | Admitting: Emergency Medicine

## 2012-03-28 DIAGNOSIS — R1032 Left lower quadrant pain: Secondary | ICD-10-CM | POA: Insufficient documentation

## 2012-03-28 DIAGNOSIS — R109 Unspecified abdominal pain: Secondary | ICD-10-CM

## 2012-03-28 LAB — CBC WITH DIFFERENTIAL/PLATELET
Basophils Absolute: 0 10*3/uL (ref 0.0–0.1)
Basophils Relative: 0 % (ref 0–1)
HCT: 43.5 % (ref 36.0–46.0)
Lymphocytes Relative: 43 % (ref 12–46)
MCHC: 34.5 g/dL (ref 30.0–36.0)
Monocytes Absolute: 0.4 10*3/uL (ref 0.1–1.0)
Neutro Abs: 2.8 10*3/uL (ref 1.7–7.7)
Neutrophils Relative %: 48 % (ref 43–77)
RDW: 12.8 % (ref 11.5–15.5)
WBC: 5.8 10*3/uL (ref 4.0–10.5)

## 2012-03-28 LAB — URINALYSIS, ROUTINE W REFLEX MICROSCOPIC
Bilirubin Urine: NEGATIVE
Glucose, UA: NEGATIVE mg/dL
Hgb urine dipstick: NEGATIVE
Ketones, ur: NEGATIVE mg/dL
Protein, ur: NEGATIVE mg/dL
Urobilinogen, UA: 0.2 mg/dL (ref 0.0–1.0)

## 2012-03-28 LAB — COMPREHENSIVE METABOLIC PANEL
ALT: 16 U/L (ref 0–35)
AST: 15 U/L (ref 0–37)
Albumin: 3.5 g/dL (ref 3.5–5.2)
Alkaline Phosphatase: 95 U/L (ref 39–117)
CO2: 27 mEq/L (ref 19–32)
Chloride: 102 mEq/L (ref 96–112)
Creatinine, Ser: 0.71 mg/dL (ref 0.50–1.10)
GFR calc non Af Amer: >90 mL/min (ref >90)
Potassium: 3.9 mEq/L (ref 3.5–5.1)
Sodium: 138 mEq/L (ref 135–145)
Total Bilirubin: 0.5 mg/dL (ref 0.3–1.2)

## 2012-03-28 LAB — LIPASE, BLOOD: Lipase: 22 U/L (ref 11–59)

## 2012-03-28 MED ORDER — METRONIDAZOLE 500 MG PO TABS: 500.0000 mg | ORAL_TABLET | Freq: Two times a day (BID) | ORAL | Status: AC

## 2012-03-28 MED ORDER — SODIUM CHLORIDE 0.9 % IV SOLN
INTRAVENOUS | Status: DC
  Administered 2012-03-28: 18:00:00 via INTRAVENOUS

## 2012-03-28 MED ORDER — ONDANSETRON HCL 4 MG/2ML IJ SOLN
4.0000 mg | Freq: Once | INTRAMUSCULAR | Status: AC
  Administered 2012-03-28: 4 mg via INTRAVENOUS
  Filled 2012-03-28: qty 2

## 2012-03-28 MED ORDER — CIPROFLOXACIN HCL 500 MG PO TABS: 500.0000 mg | ORAL_TABLET | Freq: Two times a day (BID) | ORAL | Status: AC

## 2012-03-28 MED ORDER — OXYCODONE-ACETAMINOPHEN 5-325 MG PO TABS: 1.0000 | ORAL_TABLET | ORAL | Status: AC | PRN

## 2012-03-28 MED ORDER — HYDROMORPHONE HCL PF 1 MG/ML IJ SOLN
1.0000 mg | Freq: Once | INTRAMUSCULAR | Status: AC
  Administered 2012-03-28: 1 mg via INTRAVENOUS
  Filled 2012-03-28: qty 1

## 2012-03-28 NOTE — ED Provider Notes (Addendum)
History     CSN: [id number]  Arrival date & time 03/28/12  1444   First MD Initiated Contact with Patient 03/28/12 1716      Chief Complaint  Patient presents with  . Abdominal Pain    (Consider location/radiation/quality/duration/timing/severity/associated sxs/prior treatment) Patient is a 41 y.o. female presenting with abdominal pain. The history is provided by the patient.  Abdominal Pain The primary symptoms of the illness include abdominal pain.   patient here complaining of lower abdominal pain since yesterday similar to her diverticulitis. Notes nausea but denies any vomiting. Diarrhea without bloody stools. Pain is sharp and has been constant. Denies any dizziness. Nothing makes her symptoms better worse and no medications used prior to arrival.  Past Medical History  Diagnosis Date  . Diabetes mellitus   . Hypertension   . Colitis     Past Surgical History  Procedure Date  . Abdominal hysterectomy   . Cholecystectomy   . Thyroid surgery   . Knee surgery   . Colonoscopy   . Polypectomy     Family History  Problem Relation Age of Onset  . Anesthesia problems Neg Hx   . Hypotension Neg Hx   . Malignant hyperthermia Neg Hx   . Pseudochol deficiency Neg Hx     History  Substance Use Topics  . Smoking status: Never Smoker   . Smokeless tobacco: Not on file  . Alcohol Use: No    OB History    Grav Para Term Preterm Abortions TAB SAB Ect Mult Living   6 6 5 1      6       Review of Systems  Gastrointestinal: Positive for abdominal pain.  All other systems reviewed and are negative.    Allergies  Review of patient's allergies indicates no known allergies.  Home Medications   Current Outpatient Rx  Name Route Sig Dispense Refill  . CINNAMON PO Oral Take 1 application by mouth daily. Patient takes ground cinnamon daily    . METFORMIN HCL 500 MG PO TABS Oral Take 500 mg by mouth daily with breakfast.       BP 153/97  Pulse 83  Temp 99 F (37.2  C) (Oral)  Resp 16  SpO2 97%  Physical Exam  Nursing note and vitals reviewed. Constitutional: She is oriented to person, place, and time. She appears well-developed and well-nourished.  Non-toxic appearance. No distress.  HENT:  Head: Normocephalic and atraumatic.  Eyes: Conjunctivae, EOM and lids are normal. Pupils are equal, round, and reactive to light.  Neck: Normal range of motion. Neck supple. No tracheal deviation present. No mass present.  Cardiovascular: Normal rate, regular rhythm and normal heart sounds.  Exam reveals no gallop.   No murmur heard. Pulmonary/Chest: Effort normal and breath sounds normal. No stridor. No respiratory distress. She has no decreased breath sounds. She has no wheezes. She has no rhonchi. She has no rales.  Abdominal: Soft. Normal appearance and bowel sounds are normal. She exhibits no distension. There is tenderness in the left lower quadrant. There is no rigidity, no rebound, no guarding and no CVA tenderness.  Musculoskeletal: Normal range of motion. She exhibits no edema and no tenderness.  Neurological: She is alert and oriented to person, place, and time. She has normal strength. No cranial nerve deficit or sensory deficit. GCS eye subscore is 4. GCS verbal subscore is 5. GCS motor subscore is 6.  Skin: Skin is warm and dry. No abrasion and no rash noted.  Psychiatric:  She has a normal mood and affect. Her speech is normal and behavior is normal.    ED Course  Procedures (including critical care time)   Labs Reviewed  CBC WITH DIFFERENTIAL  COMPREHENSIVE METABOLIC PANEL  URINALYSIS, ROUTINE W REFLEX MICROSCOPIC  URINE CULTURE  LIPASE, BLOOD   No results found.   No diagnosis found.    MDM  Pt with suspect diverticulitis but afebrile , nontoxic appearing , will not get an abd ct, pt given cipro and flagyl and f/u her pcp        Toy Baker, MD 03/28/12 1928  Toy Baker, MD 03/28/12 [phone]

## 2012-03-28 NOTE — ED Notes (Signed)
Pt c/o of abdominal pain and a sore throat.  Both started this past Sunday.  Pt reports abdominal pain being on her left side, tender to touch.  Pt reports nausea and diarrhea.  Denies vomiting although the pt states that she "feels like throwing up but can't".  Pt reports numerous episodes of diarrhea since Sunday with three episodes occurring today.  Pt reports abdominal pain 6/10 with no relief.  Pt has a hx of colitis and states that the pain and diarrhea is similar to when she had colitis.  Pt reports a sore throat that started on Sunday as well.  She states that she feels like something is stuck in her throat.  Pt has a cough present and clear phlegm.

## 2012-03-28 NOTE — ED Notes (Signed)
C/o lower abdominal pain and sore throat both onset yesterday. Hx of colitis, no Nausea vomiting. Positive diarrhea. Reports hard to swallow. Pt alert, oriented, nad noted. Will continue to monitor.

## 2012-03-30 LAB — URINE CULTURE: Colony Count: NO GROWTH

## 2013-02-01 ENCOUNTER — Emergency Department (HOSPITAL_COMMUNITY): Payer: BC Managed Care – PPO

## 2013-02-01 ENCOUNTER — Encounter (HOSPITAL_COMMUNITY): Payer: Self-pay | Admitting: Radiology

## 2013-02-01 ENCOUNTER — Emergency Department (HOSPITAL_COMMUNITY)
Admission: EM | Admit: 2013-02-01 | Discharge: 2013-02-01 | Disposition: A | Discharge status: Home / Self Care | Payer: BC Managed Care – PPO | Attending: Emergency Medicine | Admitting: Emergency Medicine

## 2013-02-01 DIAGNOSIS — E119 Type 2 diabetes mellitus without complications: Secondary | ICD-10-CM | POA: Insufficient documentation

## 2013-02-01 DIAGNOSIS — R110 Nausea: Secondary | ICD-10-CM

## 2013-02-01 DIAGNOSIS — I10 Essential (primary) hypertension: Secondary | ICD-10-CM | POA: Insufficient documentation

## 2013-02-01 DIAGNOSIS — Z8719 Personal history of other diseases of the digestive system: Secondary | ICD-10-CM | POA: Insufficient documentation

## 2013-02-01 DIAGNOSIS — R197 Diarrhea, unspecified: Secondary | ICD-10-CM | POA: Insufficient documentation

## 2013-02-01 DIAGNOSIS — Z79899 Other long term (current) drug therapy: Secondary | ICD-10-CM | POA: Insufficient documentation

## 2013-02-01 DIAGNOSIS — R109 Unspecified abdominal pain: Secondary | ICD-10-CM | POA: Insufficient documentation

## 2013-02-01 LAB — COMPREHENSIVE METABOLIC PANEL
Albumin: 3.5 g/dL (ref 3.5–5.2)
BUN: 13 mg/dL (ref 6–23)
CO2: 25 mEq/L (ref 19–32)
Chloride: 101 mEq/L (ref 96–112)
Creatinine, Ser: 0.87 mg/dL (ref 0.50–1.10)
GFR calc Af Amer: >90 mL/min (ref >90)
GFR calc non Af Amer: 83 mL/min — ABNORMAL LOW (ref >90)
Glucose, Bld: 139 mg/dL — ABNORMAL HIGH (ref 70–99)
Total Bilirubin: 0.5 mg/dL (ref 0.3–1.2)

## 2013-02-01 LAB — CBC WITH DIFFERENTIAL/PLATELET
Basophils Relative: 0 % (ref 0–1)
HCT: 41.8 % (ref 36.0–46.0)
Hemoglobin: 14.8 g/dL (ref 12.0–15.0)
MCHC: 35.4 g/dL (ref 30.0–36.0)
Monocytes Absolute: 0.5 10*3/uL (ref 0.1–1.0)
Monocytes Relative: 7 % (ref 3–12)
Neutro Abs: 3.8 10*3/uL (ref 1.7–7.7)

## 2013-02-01 LAB — URINALYSIS, ROUTINE W REFLEX MICROSCOPIC
Leukocytes, UA: NEGATIVE
Nitrite: NEGATIVE
Protein, ur: NEGATIVE mg/dL
Urobilinogen, UA: 0.2 mg/dL (ref 0.0–1.0)

## 2013-02-01 LAB — LIPASE, BLOOD: Lipase: 28 U/L (ref 11–59)

## 2013-02-01 MED ORDER — HYDROCODONE-ACETAMINOPHEN 5-325 MG PO TABS: 1.0000 | ORAL_TABLET | ORAL | Status: DC | PRN

## 2013-02-01 MED ORDER — IOHEXOL 300 MG/ML  SOLN
100.0000 mL | Freq: Once | INTRAMUSCULAR | Status: AC | PRN
  Administered 2013-02-01: 100 mL via INTRAVENOUS

## 2013-02-01 MED ORDER — HYDROMORPHONE HCL PF 1 MG/ML IJ SOLN
1.0000 mg | Freq: Once | INTRAMUSCULAR | Status: DC
  Filled 2013-02-01 (×2): qty 1

## 2013-02-01 MED ORDER — SODIUM CHLORIDE 0.9 % IV BOLUS (SEPSIS)
500.0000 mL | Freq: Once | INTRAVENOUS | Status: AC
  Administered 2013-02-01: 17:00:00 via INTRAVENOUS

## 2013-02-01 MED ORDER — HYDROMORPHONE HCL PF 1 MG/ML IJ SOLN
1.0000 mg | Freq: Once | INTRAMUSCULAR | Status: AC
  Administered 2013-02-01: 1 mg via INTRAVENOUS
  Filled 2013-02-01: qty 1

## 2013-02-01 MED ORDER — SODIUM CHLORIDE 0.9 % IV SOLN: INTRAVENOUS | Status: DC

## 2013-02-01 MED ORDER — ONDANSETRON HCL 4 MG PO TABS: 4.0000 mg | ORAL_TABLET | Freq: Four times a day (QID) | ORAL | Status: DC

## 2013-02-01 MED ORDER — ONDANSETRON HCL 4 MG/2ML IJ SOLN
4.0000 mg | Freq: Once | INTRAMUSCULAR | Status: AC
  Administered 2013-02-01: 4 mg via INTRAVENOUS
  Filled 2013-02-01: qty 2

## 2013-02-01 MED ORDER — IOHEXOL 300 MG/ML  SOLN
50.0000 mL | Freq: Once | INTRAMUSCULAR | Status: AC | PRN
  Administered 2013-02-01: 25 mL via ORAL

## 2013-02-01 NOTE — ED Provider Notes (Signed)
History    CSN: [id number] Arrival date & time 02/01/13  1530  First MD Initiated Contact with Patient 02/01/13 1533     Chief Complaint  Patient presents with  . Abdominal Pain   (Consider location/radiation/quality/duration/timing/severity/associated sxs/prior Treatment) HPI Comments: Angela Cisneros is a 42 y.o. Female  here for evaluation of abdominal pain, and rectal bleeding. Today, will work, she felt constipated and straining to have a bowel movement. Initially, the stool was hard, then became loose, like diarrhea. Later, she had bowel movements a pure blood with "gel clots". She had similar problems one year ago, when she was diagnosed with colitis.she denies recent fever, chills, nausea, vomiting, weakness, or dizziness. She had a colonoscopy 5 years ago with polyps, some of which showed cancer. She denies chest pain, shortness of breath , or headache. There are no known modifying factors.   Patient is a 42 y.o. female presenting with abdominal pain. The history is provided by the patient.  Abdominal Pain Associated symptoms include abdominal pain.   Past Medical History  Diagnosis Date  . Diabetes mellitus   . Hypertension   . Colitis    Past Surgical History  Procedure Laterality Date  . Abdominal hysterectomy    . Cholecystectomy    . Thyroid surgery    . Knee surgery    . Colonoscopy    . Polypectomy     Family History  Problem Relation Age of Onset  . Anesthesia problems Neg Hx   . Hypotension Neg Hx   . Malignant hyperthermia Neg Hx   . Pseudochol deficiency Neg Hx    History  Substance Use Topics  . Smoking status: Never Smoker   . Smokeless tobacco: Not on file  . Alcohol Use: No   OB History   Grav Para Term Preterm Abortions TAB SAB Ect Mult Living   6 6 5 1      6      Review of Systems  Gastrointestinal: Positive for abdominal pain.  All other systems reviewed and are negative.    Allergies  Review of patient's allergies indicates  no known allergies.  Home Medications   Current Outpatient Rx  Name  Route  Sig  Dispense  Refill  . amLODipine (NORVASC) 5 MG tablet   Oral   Take 5 mg by mouth daily.         Marland Kitchen CINNAMON PO   Oral   Take 1 application by mouth daily. Patient takes ground cinnamon daily         . tiZANidine (ZANAFLEX) 4 MG tablet   Oral   Take 4 mg by mouth at bedtime as needed.         Marland Kitchen HYDROcodone-acetaminophen (NORCO) 5-325 MG per tablet   Oral   Take 1 tablet by mouth every 4 (four) hours as needed for pain.   20 tablet   0   . ondansetron (ZOFRAN) 4 MG tablet   Oral   Take 1 tablet (4 mg total) by mouth every 6 (six) hours.   12 tablet   0    BP 134/61  Pulse 59  Temp(Src) 98.7 F (37.1 C) (Oral)  Resp 18  SpO2 98% Physical Exam  Nursing note and vitals reviewed. Constitutional: She is oriented to person, place, and time. She appears well-developed and well-nourished. She appears distressed (Crying secondary to abdominal pain).  HENT:  Head: Normocephalic and atraumatic.  Eyes: Conjunctivae and EOM are normal. Pupils are equal, round, and reactive to  light.  Neck: Normal range of motion and phonation normal. Neck supple.  Cardiovascular: Normal rate, regular rhythm and intact distal pulses.   Pulmonary/Chest: Effort normal and breath sounds normal. She exhibits no tenderness.  Abdominal: Soft. She exhibits no distension. There is no tenderness. There is no guarding.  No tenderness to palpation at 1625 when examined.  Genitourinary:  Anus- normal sphincter tone, no visible or a fissure or hemorrhoid; no stool in the rectal vault. No blood on examining finger- mucous sent for Hemoccult testing  Musculoskeletal: Normal range of motion.  Neurological: She is alert and oriented to person, place, and time. She has normal strength. She exhibits normal muscle tone.  Skin: Skin is warm and dry.  Psychiatric: She has a normal mood and affect. Her behavior is normal. Judgment and  thought content normal.    ED Course  Procedures (including critical care time) Medications  sodium chloride 0.9 % bolus 500 mL (0 mLs Intravenous Stopped 02/01/13 1919)  ondansetron (ZOFRAN) injection 4 mg (4 mg Intravenous Given 02/01/13 1708)  HYDROmorphone (DILAUDID) injection 1 mg (1 mg Intravenous Given 02/01/13 1800)  iohexol (OMNIPAQUE) 300 MG/ML solution 50 mL (25 mLs Oral Contrast Given 02/01/13 1721)  iohexol (OMNIPAQUE) 300 MG/ML solution 100 mL (100 mLs Intravenous Contrast Given 02/01/13 1849)  ondansetron (ZOFRAN) injection 4 mg (4 mg Intravenous Given 02/01/13 1915)    Patient Vitals for the past 24 hrs:  BP Temp Temp src Pulse Resp SpO2  02/01/13 1945 134/61 mmHg - - 59 - 98 %  02/01/13 1915 134/79 mmHg - - 63 - 99 %  02/01/13 1830 138/70 mmHg - - 55 - 97 %  02/01/13 1645 143/69 mmHg - - 62 - 99 %  02/01/13 1630 139/81 mmHg - - 61 - 100 %  02/01/13 1615 128/66 mmHg - - 61 - 99 %  02/01/13 1600 127/84 mmHg - - 64 - 100 %  02/01/13 1541 130/71 mmHg 98.7 F (37.1 C) Oral 60 18 98 %     7:47 PM Reevaluation with update and discussion. After initial assessment and treatment, an updated evaluation reveals she has less pain but is still nauseated. No further c/o. Makyle Eslick L      Labs Reviewed  COMPREHENSIVE METABOLIC PANEL - Abnormal; Notable for the following:    Glucose, Bld 139 (*)    GFR calc non Af Amer 83 (*)    All other components within normal limits  CBC WITH DIFFERENTIAL  URINALYSIS, ROUTINE W REFLEX MICROSCOPIC  LIPASE, BLOOD  OCCULT BLOOD, POC DEVICE   Ct Abdomen Pelvis W Contrast  02/01/2013   *RADIOLOGY REPORT*  Clinical Data: Right upper quadrant pain with nausea and vomiting.  CT ABDOMEN AND PELVIS WITH CONTRAST  Technique:  Multidetector CT imaging of the abdomen and pelvis was performed following the standard protocol during bolus administration of intravenous contrast.  Contrast: OMNIPAQUE IOHEXOL 300 MG/ML  SOLN  Comparison: CT scan of the  abdomen dated 01/07/2009  Findings: The liver is normal.  Gallbladder has been removed. Chronic slight prominence of the biliary tree, stable.  Spleen, pancreas, adrenal glands, and kidneys are normal.  The bowel is normal including the terminal ileum and appendix.  Small collapsed cyst on the left ovary.  Right ovary is normal.  Uterus has been removed.  No free air or free fluid.  No osseous abnormality.  IMPRESSION: Benign-appearing abdomen and pelvis.   Original Report Authenticated By: Francene Boyers, M.D.   1. Abdominal pain  2. Diarrhea   3. Nausea     MDM  Nonspecific abdominal pain, with diarrhea. No significant rectal bleeding on exam. Hemoglobin is normal. Stool Hemoccult is negative. Doubt metabolic instability, serious bacterial infection or impending vascular collapse; the patient is stable for discharge.   Nursing Notes Reviewed/ Care Coordinated, and agree without changes. Applicable Imaging Reviewed.  Interpretation of Laboratory Data incorporated into ED treatment    Plan: Home Medications- Norco, Zofran; Home Treatments and Observation- gradually advance diet; return here if the recommended treatment, does not improve the symptoms; Recommended follow up- GI f/u in 1-2 weeks      Flint Melter, MD 02/01/13 2359

## 2013-02-01 NOTE — ED Notes (Signed)
Pt reports blood on toliet water and on tissue this AM . Pain in RUQ .

## 2013-05-11 ENCOUNTER — Encounter (HOSPITAL_COMMUNITY): Payer: Self-pay | Admitting: *Deleted

## 2013-05-11 ENCOUNTER — Inpatient Hospital Stay (HOSPITAL_COMMUNITY)
Admission: AD | Admit: 2013-05-11 | Discharge: 2013-05-11 | Disposition: A | Discharge status: Home / Self Care | Payer: BC Managed Care – PPO | Source: Ambulatory Visit | Attending: Obstetrics & Gynecology | Admitting: Obstetrics & Gynecology

## 2013-05-11 DIAGNOSIS — Z90710 Acquired absence of both cervix and uterus: Secondary | ICD-10-CM | POA: Insufficient documentation

## 2013-05-11 DIAGNOSIS — L293 Anogenital pruritus, unspecified: Secondary | ICD-10-CM | POA: Insufficient documentation

## 2013-05-11 DIAGNOSIS — R1032 Left lower quadrant pain: Secondary | ICD-10-CM

## 2013-05-11 DIAGNOSIS — K5289 Other specified noninfective gastroenteritis and colitis: Secondary | ICD-10-CM

## 2013-05-11 HISTORY — DX: Leiomyoma of uterus, unspecified: D25.9

## 2013-05-11 HISTORY — DX: Headache: R51

## 2013-05-11 HISTORY — DX: Hypothyroidism, unspecified: E03.9

## 2013-05-11 LAB — WET PREP, GENITAL
Trich, Wet Prep: NONE SEEN
Yeast Wet Prep HPF POC: NONE SEEN

## 2013-05-11 LAB — URINALYSIS, ROUTINE W REFLEX MICROSCOPIC
Bilirubin Urine: NEGATIVE
Glucose, UA: NEGATIVE mg/dL
Hgb urine dipstick: NEGATIVE
Protein, ur: NEGATIVE mg/dL
Urobilinogen, UA: 0.2 mg/dL (ref 0.0–1.0)

## 2013-05-11 LAB — CBC
Hemoglobin: 14.5 g/dL (ref 12.0–15.0)
MCH: 29.5 pg (ref 26.0–34.0)
MCV: 86.2 fL (ref 78.0–100.0)
RBC: 4.92 MIL/uL (ref 3.87–5.11)

## 2013-05-11 MED ORDER — OXYCODONE-ACETAMINOPHEN 5-325 MG PO TABS: 1.0000 | ORAL_TABLET | Freq: Four times a day (QID) | ORAL | Status: DC | PRN

## 2013-05-11 NOTE — MAU Note (Signed)
Patient states she has had sharp splintering like pain in the left lower abdomen for about 2 days, comes and goes.

## 2013-05-11 NOTE — MAU Provider Note (Signed)
Chief Complaint: Abdominal Pain   First Provider Initiated Contact with Patient 05/11/13 1133     SUBJECTIVE HPI: Angela Cisneros is a 42 y.o. [id number]  who presents to maternity admissions reporting LLQ, "prickly" abdominal pain. She also reports some vaginal itching, but no noticeable discharge.  Her hx is significant for partial hysterectomy and diverticulitis.  Her bowel habits have been normal. She has not eaten anything today.  She denies back pain, vaginal bleeding, urinary symptoms, h/a, dizziness, n/v, or fever/chills.     Past Medical History  Diagnosis Date  . Hypertension   . Colitis   . Hypothyroidism   . Headache(784.0)   . Fibroid, uterine    Past Surgical History  Procedure Laterality Date  . Abdominal hysterectomy    . Cholecystectomy    . Thyroid surgery    . Knee surgery    . Colonoscopy    . Polypectomy    . Tubal ligation     History   Social History  . Marital Status: Legally Separated    Spouse Name: N/A    Number of Children: N/A  . Years of Education: N/A   Occupational History  . Not on file.   Social History Main Topics  . Smoking status: Never Smoker   . Smokeless tobacco: Not on file  . Alcohol Use: No  . Drug Use: No  . Sexual Activity: Yes    Birth Control/ Protection: Surgical   Other Topics Concern  . Not on file   Social History Narrative  . No narrative on file   No current facility-administered medications on file prior to encounter.   Current Outpatient Prescriptions on File Prior to Encounter  Medication Sig Dispense Refill  . amLODipine (NORVASC) 5 MG tablet Take 5 mg by mouth daily.      Marland Kitchen CINNAMON PO Take 1 tablet by mouth 2 (two) times daily. Patient takes ground cinnamon daily      . tiZANidine (ZANAFLEX) 4 MG tablet Take 4 mg by mouth at bedtime as needed.      . [DISCONTINUED] loperamide (IMODIUM A-D) 2 MG tablet 2 mg. take one tab after each loose bowel movement        No Known Allergies  ROS: Pertinent  items in HPI  OBJECTIVE Blood pressure 163/86, pulse 68, temperature 98.4 F (36.9 C), temperature source Oral, resp. rate 18, height 5' 3.5" (1.613 m), weight 95.709 kg (211 lb), SpO2 99.00%. GENERAL: Well-developed, well-nourished female in no acute distress.  HEENT: Normocephalic HEART: normal rate RESP: normal effort ABDOMEN: Soft, mild tenderness to palpation in left mid abdomen, just below umbilicus Bowel sounds hypoactive but present in all 4 quadrants EXTREMITIES: Nontender, no edema NEURO: Alert and oriented Pelvic exam: vaginal cuff well approximated, no edema or erythema, moderate white creamy discharge, vaginal walls and external genitalia normal Bimanual exam: adnexa without tenderness, enlargement, or mass  LAB RESULTS Results for orders placed during the hospital encounter of 05/11/13 (from the past 24 hour(s))  WET PREP, GENITAL     Status: Abnormal   Collection Time    05/11/13 11:25 AM      Result Value Range   Yeast Wet Prep HPF POC NONE SEEN  NONE SEEN   Trich, Wet Prep NONE SEEN  NONE SEEN   Clue Cells Wet Prep HPF POC FEW (*) NONE SEEN   WBC, Wet Prep HPF POC NONE SEEN  NONE SEEN  GC/CHLAMYDIA PROBE AMP     Status: None   Collection  Time    05/11/13 11:25 AM      Result Value Range   CT Probe RNA NEGATIVE  NEGATIVE   GC Probe RNA NEGATIVE  NEGATIVE  CBC     Status: None   Collection Time    05/11/13 11:56 AM      Result Value Range   WBC 5.1  4.0 - 10.5 K/uL   RBC 4.92  3.87 - 5.11 MIL/uL   Hemoglobin 14.5  12.0 - 15.0 g/dL   HCT 14.7  82.9 - 56.2 %   MCV 86.2  78.0 - 100.0 fL   MCH 29.5  26.0 - 34.0 pg   MCHC 34.2  30.0 - 36.0 g/dL   RDW 13.0  86.5 - 78.4 %   Platelets 250  150 - 400 K/uL    ASSESSMENT 1. COLITIS, ACUTE   2. LLQ abdominal pain     PLAN Discharge home Percocet 5/325 Q 4 hours PRN pain, x15 tabs F/U with GI provider Increase PO fluid Return to MAU or ED as needed    Medication List    ASK your doctor about these  medications       amLODipine 5 MG tablet  Commonly known as:  NORVASC  Take 5 mg by mouth daily.     CINNAMON PO  Take 1 tablet by mouth 2 (two) times daily. Patient takes ground cinnamon daily     tiZANidine 4 MG tablet  Commonly known as:  ZANAFLEX  Take 4 mg by mouth at bedtime as needed.         Sharen Counter Certified Nurse-Midwife 05/11/2013  11:34 AM

## 2013-05-12 LAB — GC/CHLAMYDIA PROBE AMP
CT Probe RNA: NEGATIVE
GC Probe RNA: NEGATIVE

## 2013-08-19 ENCOUNTER — Encounter (HOSPITAL_COMMUNITY): Payer: Self-pay | Admitting: Emergency Medicine

## 2013-08-19 ENCOUNTER — Emergency Department (HOSPITAL_COMMUNITY)
Admission: EM | Admit: 2013-08-19 | Discharge: 2013-08-20 | Discharge status: Against Medical Advice | Payer: BC Managed Care – PPO | Attending: Emergency Medicine | Admitting: Emergency Medicine

## 2013-08-19 DIAGNOSIS — R197 Diarrhea, unspecified: Secondary | ICD-10-CM | POA: Insufficient documentation

## 2013-08-19 DIAGNOSIS — R112 Nausea with vomiting, unspecified: Secondary | ICD-10-CM | POA: Insufficient documentation

## 2013-08-19 DIAGNOSIS — R5381 Other malaise: Secondary | ICD-10-CM | POA: Insufficient documentation

## 2013-08-19 DIAGNOSIS — I10 Essential (primary) hypertension: Secondary | ICD-10-CM | POA: Insufficient documentation

## 2013-08-19 DIAGNOSIS — R5383 Other fatigue: Secondary | ICD-10-CM

## 2013-08-19 MED ORDER — ONDANSETRON 4 MG PO TBDP
8.0000 mg | ORAL_TABLET | Freq: Once | ORAL | Status: AC
  Administered 2013-08-19: 8 mg via ORAL
  Filled 2013-08-19: qty 2

## 2013-08-19 NOTE — ED Notes (Signed)
The pt is c/o nv and diarrhea with weakness since Friday.  No cough or cold. .  Unable to hold food down and or water.  lmp hysterectomy

## 2013-08-20 ENCOUNTER — Encounter (HOSPITAL_COMMUNITY): Payer: Self-pay | Admitting: Emergency Medicine

## 2013-08-20 ENCOUNTER — Emergency Department (HOSPITAL_COMMUNITY)
Admission: EM | Admit: 2013-08-20 | Discharge: 2013-08-20 | Disposition: A | Discharge status: Home / Self Care | Payer: BC Managed Care – PPO | Source: Home / Self Care | Attending: Emergency Medicine | Admitting: Emergency Medicine

## 2013-08-20 DIAGNOSIS — K529 Noninfective gastroenteritis and colitis, unspecified: Secondary | ICD-10-CM

## 2013-08-20 DIAGNOSIS — K5289 Other specified noninfective gastroenteritis and colitis: Secondary | ICD-10-CM

## 2013-08-20 LAB — POCT I-STAT, CHEM 8
BUN: 14 mg/dL (ref 6–23)
CALCIUM ION: 1.16 mmol/L (ref 1.12–1.23)
Chloride: 102 mEq/L (ref 96–112)
Creatinine, Ser: 1 mg/dL (ref 0.50–1.10)
Glucose, Bld: 127 mg/dL — ABNORMAL HIGH (ref 70–99)
HCT: 49 % — ABNORMAL HIGH (ref 36.0–46.0)
HEMOGLOBIN: 16.7 g/dL — AB (ref 12.0–15.0)
Potassium: 3.4 mEq/L — ABNORMAL LOW (ref 3.7–5.3)
Sodium: 141 mEq/L (ref 137–147)
TCO2: 23 mmol/L (ref 0–100)

## 2013-08-20 LAB — CBC WITH DIFFERENTIAL/PLATELET
Basophils Absolute: 0 10*3/uL (ref 0.0–0.1)
Basophils Relative: 0 % (ref 0–1)
EOS ABS: 0 10*3/uL (ref 0.0–0.7)
EOS PCT: 1 % (ref 0–5)
HCT: 42.9 % (ref 36.0–46.0)
Hemoglobin: 15.4 g/dL — ABNORMAL HIGH (ref 12.0–15.0)
Lymphocytes Relative: 59 % — ABNORMAL HIGH (ref 12–46)
Lymphs Abs: 1.9 10*3/uL (ref 0.7–4.0)
MCH: 31.3 pg (ref 26.0–34.0)
MCHC: 35.9 g/dL (ref 30.0–36.0)
MCV: 87.2 fL (ref 78.0–100.0)
Monocytes Absolute: 0.3 10*3/uL (ref 0.1–1.0)
Monocytes Relative: 11 % (ref 3–12)
Neutro Abs: 0.9 10*3/uL — ABNORMAL LOW (ref 1.7–7.7)
Neutrophils Relative %: 29 % — ABNORMAL LOW (ref 43–77)
Platelets: 248 10*3/uL (ref 150–400)
RBC: 4.92 MIL/uL (ref 3.87–5.11)
RDW: 12.8 % (ref 11.5–15.5)
WBC: 3.1 10*3/uL — ABNORMAL LOW (ref 4.0–10.5)

## 2013-08-20 LAB — CLOSTRIDIUM DIFFICILE BY PCR: Toxigenic C. Difficile by PCR: NEGATIVE

## 2013-08-20 MED ORDER — DIPHENOXYLATE-ATROPINE 2.5-0.025 MG PO TABS: 1.0000 | ORAL_TABLET | Freq: Four times a day (QID) | ORAL | Status: DC | PRN

## 2013-08-20 MED ORDER — GI COCKTAIL ~~LOC~~
ORAL | Status: AC
  Filled 2013-08-20: qty 30

## 2013-08-20 MED ORDER — GI COCKTAIL ~~LOC~~
30.0000 mL | Freq: Once | ORAL | Status: AC
  Administered 2013-08-20: 30 mL via ORAL

## 2013-08-20 MED ORDER — HYDROCODONE-ACETAMINOPHEN 5-325 MG PO TABS: ORAL_TABLET | ORAL | Status: DC

## 2013-08-20 MED ORDER — CIPROFLOXACIN HCL 500 MG PO TABS: 500.0000 mg | ORAL_TABLET | Freq: Two times a day (BID) | ORAL | Status: DC

## 2013-08-20 MED ORDER — ONDANSETRON 4 MG PO TBDP
8.0000 mg | ORAL_TABLET | Freq: Once | ORAL | Status: AC
  Administered 2013-08-20: 8 mg via ORAL

## 2013-08-20 MED ORDER — ONDANSETRON 8 MG PO TBDP: 8.0000 mg | ORAL_TABLET | Freq: Three times a day (TID) | ORAL | Status: DC | PRN

## 2013-08-20 MED ORDER — SODIUM CHLORIDE 0.9 % IV SOLN: INTRAVENOUS | Status: DC

## 2013-08-20 MED ORDER — ONDANSETRON 4 MG PO TBDP
ORAL_TABLET | ORAL | Status: AC
  Filled 2013-08-20: qty 2

## 2013-08-20 MED ORDER — METRONIDAZOLE 500 MG PO TABS: 500.0000 mg | ORAL_TABLET | Freq: Three times a day (TID) | ORAL | Status: DC

## 2013-08-20 NOTE — ED Notes (Signed)
Called to place in exam room; no answer. x2

## 2013-08-20 NOTE — Discharge Instructions (Signed)
You have been diagnosed with gastroenteritis.  This can be caused by a virus or a bacteria.  Viral infections can last from less than a day to a week.  If your symptoms last more than a week, a bacterial infection is more likely.  Either way, you must assume you are contagious and take infectious precautions.  If you work in food preparation, you should stay out of work.  Likewise, you should not prepare food for your family.  Practice frequent hand washing.  Hand sanitizer does not reliably kill the virus.  Wash your hands after you use the bathroom, touch your mouth or face, and before contact with anyone.  Do not kiss anyone and do not let anyone eat or drink after you.  For right now, we recommend taking only clear liquids.  This would include things like Gator Aid or other sports drinks, tea, water, ice chips, clear juices, ginger ale, Seven-Up, Sprite, Pedialyte, jello, clear broth--anything you can see through and applesauce.  You should do this for at least 24 hours, perhaps longer.  We recommend small sips at a time.  Sometimes drinking a large amount will cause you to be nauseated and you will vomit it back up.  Sometimes it helps to have this chilled or drink it over ice chips.  Once your stomach settles down a little, you can advance to a very light diet.  We have a diet called the b.r.a.t. Diet which stands for the following:  Bananas  Rice  Apple sauce (not apple juice)  Toast or crackers.  If diarrhea becomes a problem, you may try Imodeum unless your doctor tells you not to. You can take up to 4 per day or 1 every 6 hours.  Stick with this for about 24 hours, then you may advance to a more regular diet, but your stomach will be sensitive for 5 to 7 days, so it would be a good idea to avoid heavy, greasy, fried, or spicey foods.    You should return if:  You symptoms are not better in 3 days or they have gone on for 7 days total.  You have severe symptoms of high fever or severe  abdominal pain.  You feel you are getting dehydrated with dizziness, weakness, muscle cramps, or severe fatigue.  You have blood in your vomitus or stool.  This includes black discoloration of your vomitus or stool.  But remember that [place] can cause black stools.    Potassium Content of Foods Potassium is a mineral found in many foods and drinks. It helps keep fluids and minerals balanced in your body and also affects how steadily your heart beats. The body needs potassium to control blood pressure and to keep the muscles and nervous system healthy. However, certain health conditions and medicine may require you to eat more or less potassium-rich foods and drinks. Your caregiver or dietitian will tell you how much potassium you should have each day. COMMON SERVING SIZES The list below tells you how big or small common portion sizes are:  1 oz.........4 stacked dice.  3 oz........Marland KitchenDeck of cards.  1 tsp.......Marland KitchenTip of little finger.  1 tbsp....Marland KitchenMarland KitchenThumb.  2 tbsp....Marland KitchenMarland KitchenGolf ball.   c..........Marland KitchenHalf of a fist.  1 c...........Marland KitchenA fist. FOODS AND DRINKS HIGH IN POTASSIUM More than 200 mg of potassium per serving. A serving size is  c (120 mL or noted gram weight) unless otherwise stated. While all the items on this list are high in potassium, some items are  higher in potassium than others. Fruits  Apricots (sliced), 83 g.  Apricots (dried halves), 3 oz / 24 g.  Avocado (cubed),  c / 50 g.  Banana (sliced), 75 g.  Cantaloupe (cubed), 80 g.  Dates (pitted), 5 whole / 35 g.  Figs (dried), 4 whole / 32 g.  Guava, c / 55 g.  Honeydew, 1 wedge / 85 g.  Kiwi (sliced), 90 g.  Nectarine, 1 small / 129 g.  Orange, 1 medium / 131 g.  Orange juice.  Pomegranate seeds, 87 g.  Pomegranate juice.  Prunes (pitted), 3 whole / 30 g.  Prune juice, 3 oz / 90 mL.  Seedless raisins, 3 tbsp / 27 g. Vegetables  Artichoke,  of a medium / 64 g.  Asparagus (boiled), 90  g.  Baked beans,  c / 63 g.  Bamboo shoots,  c / 38 g.  Beets (cooked slices), 85 g.  Broccoli (boiled), 78 g.  Brussels sprout (boiled), 78 g.  Butternut squash (baked), 103 g.  Chickpea (cooked), 82 g.  Green peas (cooked), 80 g.  Hubbard squash (baked cubes),  c / 68 g.  Kidney beans (cooked), 5 tbsp / 55 g.  Lima beans (cooked),  c / 43 g.  Navy beans (cooked),  c / 61 g.  Potato (baked), 61 g.  Potato (boiled), 78 g.  Pumpkin (boiled), 123 g.  Refried beans,  c / 79 g.  Spinach (cooked),  c / 45 g.  Split peas (cooked),  c / 65 g.  Sun-dried tomatoes, 2 tbsp / 7 g.  Sweet potato (baked),  c / 50 g.  Tomato (chopped or sliced), 90 g.  Tomato juice.  Tomato paste, 4 tsp / 21 g.  Tomato sauce,  c / 61 g.  Vegetable juice.  White mushrooms (cooked), 78 g.  Yam (cooked or baked),  c / 34 g.  Zucchini squash (boiled), 90 g. Other Foods and Drinks  Almonds (whole),  c / 36 g.  Cashews (oil roasted),  c / 32 g.  Chocolate milk.  Chocolate pudding, 142 g.  Clams (steamed), 1.5 oz / 43 g.  Dark chocolate, 1.5 oz / 42 g.  Fish, 3 oz / 85 g.  King crab (steamed), 3 oz / 85 g.  Lobster (steamed), 4 oz / 113 g.  Milk (skim, 1%, 2%, whole), 1 c / 240 mL.  Milk chocolate, 2.3 oz / 66 g.  Milk shake.  Nonfat fruit variety yogurt, 123 g.  Peanuts (oil roasted), 1 oz / 28 g.  Peanut butter, 2 tbsp / 32 g.  Pistachio nuts, 1 oz / 28 g.  Pumpkin seeds, 1 oz / 28 g.  Red meat (broiled, cooked, grilled), 3 oz / 85 g.  Scallops (steamed), 3 oz / 85 g.  Shredded wheat cereal (dry), 3 oblong biscuits / 75 g.  Spaghetti sauce,  c / 66 g.  Sunflower seeds (dry roasted), 1 oz / 28 g.  Veggie burger, 1 patty / 70 g. FOODS MODERATE IN POTASSIUM Between 150 mg and 200 mg per serving. A serving is  c (120 mL or noted gram weight) unless otherwise stated. Fruits  Grapefruit,  of the fruit / 123 g.  Grapefruit  juice.  Pineapple juice.  Plums (sliced), 83 g.  Tangerine, 1 large / 120 g. Vegetables  Carrots (boiled), 78 g.  Carrots (sliced), 61 g.  Rhubarb (cooked with sugar), 120 g.  [place] (cooked), 120 g.  Sweet corn (  cooked), 75 g.  Yellow snap beans (cooked), 63 g. Other Foods and Drinks   Bagel, 1 bagel / 98 g.  Chicken breast (roasted and chopped),  c / 70 g.  Chocolate ice cream / 66 g.  Pita bread, 1 large / 64 g.  Shrimp (steamed), 4 oz / 113 g.  Swiss cheese (diced), 70 g.  Vanilla ice cream, 66 g.  Vanilla pudding, 140 g. FOODS LOW IN POTASSIUM Less than 150 mg per serving. A serving size is  cup (120 mL or noted gram weight) unless otherwise stated. If you eat more than 1 serving of a food low in potassium, the food may be considered a food high in potassium. Fruits  Apple (slices), 55 g.  Apple juice.  Applesauce, 122 g.  Blackberries, 72 g.  Blueberries, 74 g.  Cranberries, 50 g.  Cranberry juice.  Fruit cocktail, 119 g.  Fruit punch.  Grapes, 46 g.  Grape juice.  Mandarin oranges (canned), 126 g.  Peach (slices), 77 g.  Pineapple (chunks), 83 g.  Raspberries, 62 g.  Red cherries (without pits), 78 g.  Strawberries (sliced), 83 g.  Watermelon (diced), 76 g. Vegetables  Alfalfa sprouts, 17 g.  Bell peppers (sliced), 46 g.  Cabbage (shredded), 35 g.  Cauliflower (boiled), 62 g.  Celery, 51 g.  Collard greens (boiled), 95 g.  Cucumber (sliced), 52 g.  Eggplant (cubed), 41 g.  Green beans (boiled), 63 g.  Lettuce (shredded), 1 c / 36 g.  Onions (sauteed), 44 g.  Radishes (sliced), 58 g.  Spaghetti squash, 51 g. Other Foods and Drinks  [employer], 1 slice / 28 g.  Black tea.  Brown rice (cooked), 98 g.  Butter croissant, 1 medium / 57 g.  Carbonated soda.  Coffee.  Cheddar cheese (diced), 66 g.  Corn flake cereal (dry), 14 g.  Cottage cheese, 118 g.  Cream of rice cereal (cooked), 122  g.  Cream of wheat cereal (cooked), 126 g.  Crisped rice cereal (dry), 14 g.  Egg (boiled, fried, poached, omelet, scrambled), 1 large / 46 61 g.  English muffin, 1 muffin / 57 g.  Frozen ice pop, 1 pop / 55 g.  Graham cracker, 1 large rectangular cracker / 14 g.  Jelly beans, 112 g.  Non-dairy whipped topping.  Oatmeal, 88 g.  Orange sherbet, 74 g.  Puffed rice cereal (dry), 7 g.  Pasta (cooked), 70 g.  Rice cakes, 4 cakes / 36 g.  Sugared doughnut, 4 oz / 116 g.  White bread, 1 slice / 30 g.  White rice (cooked), 79 93 g.  Wild rice (cooked), 82 g.  Yellow cake, 1 slice / 68 g. Document Released: 03/02/2005 Document Revised: 07/05/2012 Document Reviewed: 12/03/2011 [facility] Patient Information 2014 [place].

## 2013-08-20 NOTE — ED Provider Notes (Signed)
Chief Complaint   Chief Complaint  Patient presents with  . Diarrhea     History of Present Illness   Yajahira A Ratti is a 43 year old female who has had a four-day history of gastroenteritis symptoms. These came on after eating at restaurant. She noted excessive flatulence, borborygmus, and diarrhea with 6-7 loose stools per day without blood or mucus. She has also had nausea but no vomiting, intermittent crampy abdominal pains, and has felt chilled but not had a fever. She has had no suspicious exposures. No foreign travel or animal exposure.  Review of Systems   Other than as noted above, the patient denies any of the following symptoms: Systemic:  No fevers, chills, sweats, weight loss or gain, fatigue, or tiredness. ENT:  No nasal congestion, rhinorrhea, or sore throat. Lungs:  No cough, wheezing, or shortness of breath. Cardiac:  No chest pain, syncope, or presyncope. GI:  No abdominal pain, nausea, vomiting, anorexia, diarrhea, constipation, blood in stool or vomitus. GU:  No dysuria, frequency, or urgency.  [place]   Past medical history, family history, social history, meds, and allergies were reviewed.  She has high blood pressure and diet controlled diabetes. Her only medication is amlodipine.  Physical Exam     Vital signs:  BP 114/86  Pulse 88  Temp(Src) 98.5 F (36.9 C) (Oral)  Resp 18  SpO2 100% General:  Alert and oriented.  In no distress.  Skin warm and dry.  Good skin turgor, brisk capillary refill. ENT:  No scleral icterus, moist mucous membranes, no oral lesions, pharynx clear. Lungs:  Breath sounds clear and equal bilaterally.  No wheezes, rales, or rhonchi. Heart:  Rhythm regular, without extrasystoles.  No gallops or murmers. Abdomen:  Soft, flat, nondistended. No organomegaly or mass. Bowel sounds are hyperactive. She has mild generalized tenderness to palpation without guarding or rebound. There was no localized tenderness to palpation. Skin: Clear,  warm, and dry.  Good turgor.  Brisk capillary refill.  Labs   Results for orders placed during the hospital encounter of 08/20/13  CBC WITH DIFFERENTIAL      Result Value Range   WBC 3.1 (*) 4.0 - 10.5 K/uL   RBC 4.92  3.87 - 5.11 MIL/uL   Hemoglobin 15.4 (*) 12.0 - 15.0 g/dL   HCT 42.9  36.0 - 46.0 %   MCV 87.2  78.0 - 100.0 fL   MCH 31.3  26.0 - 34.0 pg   MCHC 35.9  30.0 - 36.0 g/dL   RDW 12.8  11.5 - 15.5 %   Platelets 248  150 - 400 K/uL   Neutrophils Relative % 29 (*) 43 - 77 %   Neutro Abs 0.9 (*) 1.7 - 7.7 K/uL   Lymphocytes Relative 59 (*) 12 - 46 %   Lymphs Abs 1.9  0.7 - 4.0 K/uL   Monocytes Relative 11  3 - 12 %   Monocytes Absolute 0.3  0.1 - 1.0 K/uL   Eosinophils Relative 1  0 - 5 %   Eosinophils Absolute 0.0  0.0 - 0.7 K/uL   Basophils Relative 0  0 - 1 %   Basophils Absolute 0.0  0.0 - 0.1 K/uL  POCT I-STAT, CHEM 8      Result Value Range   Sodium 141  137 - 147 mEq/L   Potassium 3.4 (*) 3.7 - 5.3 mEq/L   Chloride 102  96 - 112 mEq/L   BUN 14  6 - 23 mg/dL   Creatinine, Ser 1.00  0.50 - 1.10 mg/dL   Glucose, Bld 127 (*) 70 - 99 mg/dL   Calcium, Ion 1.16  1.12 - 1.23 mmol/L   TCO2 23  0 - 100 mmol/L   Hemoglobin 16.7 (*) 12.0 - 15.0 g/dL   HCT 49.0 (*) 36.0 - 46.0 %    Stool cultures and stools for Clostridium difficile PCR were obtained.  Course [facility]   She was begun on IV normal saline and given 1 L over an hour. She was also given Zofran ODT 8 mg sublingually and GI cocktail 30 cc by mouth. Thereafter she stated she felt better and felt like going home.   Assessment   The encounter diagnosis was Gastroenteritis.  Plan   1.  Meds:  The following meds were prescribed:   Discharge Medication List as of 08/20/2013 11:34 AM    START taking these medications   Details  ciprofloxacin (CIPRO) 500 MG tablet Take 1 tablet (500 mg total) by mouth every 12 (twelve) hours., Starting 08/20/2013, Until Discontinued, Normal     diphenoxylate-atropine (LOMOTIL) 2.5-0.025 MG per tablet Take 1 tablet by mouth 4 (four) times daily as needed for diarrhea or loose stools., Starting 08/20/2013, Until Discontinued, Print    HYDROcodone-acetaminophen (NORCO/VICODIN) 5-325 MG per tablet 1 to 2 tabs every 4 to 6 hours as needed for pain., Print    metroNIDAZOLE (FLAGYL) 500 MG tablet Take 1 tablet (500 mg total) by mouth 3 (three) times daily., Starting 08/20/2013, Until Discontinued, Normal    ondansetron (ZOFRAN ODT) 8 MG disintegrating tablet Take 1 tablet (8 mg total) by mouth every 8 (eight) hours as needed for nausea., Starting 08/20/2013, Until Discontinued, Normal        2.  Patient Education/Counseling:  The patient was given appropriate handouts, self care instructions, and instructed in symptomatic relief. The patient was told to stay on clear liquids for the remainder of the day, then advance to a B.R.A.T. diet starting tomorrow.   3.  Follow up:  The patient was told to follow up here if no better in 2 to 3 days, or sooner if becoming worse in any way, and given some red flag symptoms such as persistent vomitng, high fever, severe abdominal pain, or any GI bleeding which would prompt immediate return.        Harden Mo, MD 08/20/13 1153

## 2013-08-20 NOTE — ED Notes (Signed)
Pt c/o severe diarrhea since Friday.  No relief with otc meds.  Pt was seen in ER last night and given nausea medication.   Still no relief with diarrhea.  States normal appetite.  Denies any other symptoms.

## 2013-08-20 NOTE — ED Notes (Signed)
Called to place in exam room; no answer.

## 2013-08-21 ENCOUNTER — Encounter (HOSPITAL_COMMUNITY): Payer: Self-pay | Admitting: Emergency Medicine

## 2013-08-21 ENCOUNTER — Emergency Department (HOSPITAL_COMMUNITY)
Admission: EM | Admit: 2013-08-21 | Discharge: 2013-08-21 | Disposition: A | Discharge status: Home / Self Care | Payer: BC Managed Care – PPO | Attending: Emergency Medicine | Admitting: Emergency Medicine

## 2013-08-21 DIAGNOSIS — R112 Nausea with vomiting, unspecified: Secondary | ICD-10-CM | POA: Insufficient documentation

## 2013-08-21 DIAGNOSIS — Z8742 Personal history of other diseases of the female genital tract: Secondary | ICD-10-CM | POA: Insufficient documentation

## 2013-08-21 DIAGNOSIS — R197 Diarrhea, unspecified: Secondary | ICD-10-CM

## 2013-08-21 DIAGNOSIS — R1084 Generalized abdominal pain: Secondary | ICD-10-CM | POA: Insufficient documentation

## 2013-08-21 DIAGNOSIS — I10 Essential (primary) hypertension: Secondary | ICD-10-CM | POA: Insufficient documentation

## 2013-08-21 DIAGNOSIS — Z8639 Personal history of other endocrine, nutritional and metabolic disease: Secondary | ICD-10-CM | POA: Insufficient documentation

## 2013-08-21 DIAGNOSIS — Z862 Personal history of diseases of the blood and blood-forming organs and certain disorders involving the immune mechanism: Secondary | ICD-10-CM | POA: Insufficient documentation

## 2013-08-21 DIAGNOSIS — Z8719 Personal history of other diseases of the digestive system: Secondary | ICD-10-CM | POA: Insufficient documentation

## 2013-08-21 DIAGNOSIS — Z79899 Other long term (current) drug therapy: Secondary | ICD-10-CM | POA: Insufficient documentation

## 2013-08-21 LAB — CBC WITH DIFFERENTIAL/PLATELET
Basophils Absolute: 0 10*3/uL (ref 0.0–0.1)
Basophils Relative: 0 % (ref 0–1)
EOS ABS: 0.1 10*3/uL (ref 0.0–0.7)
Eosinophils Relative: 2 % (ref 0–5)
HCT: 41.8 % (ref 36.0–46.0)
Hemoglobin: 14.7 g/dL (ref 12.0–15.0)
LYMPHS ABS: 2.4 10*3/uL (ref 0.7–4.0)
Lymphocytes Relative: 52 % — ABNORMAL HIGH (ref 12–46)
MCH: 30.2 pg (ref 26.0–34.0)
MCHC: 35.2 g/dL (ref 30.0–36.0)
MCV: 85.8 fL (ref 78.0–100.0)
Monocytes Absolute: 0.6 10*3/uL (ref 0.1–1.0)
Monocytes Relative: 13 % — ABNORMAL HIGH (ref 3–12)
NEUTROS PCT: 33 % — AB (ref 43–77)
Neutro Abs: 1.5 10*3/uL — ABNORMAL LOW (ref 1.7–7.7)
PLATELETS: 274 10*3/uL (ref 150–400)
RBC: 4.87 MIL/uL (ref 3.87–5.11)
RDW: 12.5 % (ref 11.5–15.5)
WBC: 4.6 10*3/uL (ref 4.0–10.5)

## 2013-08-21 LAB — LIPASE, BLOOD: Lipase: 24 U/L (ref 11–59)

## 2013-08-21 LAB — COMPREHENSIVE METABOLIC PANEL
ALBUMIN: 3.5 g/dL (ref 3.5–5.2)
ALK PHOS: 99 U/L (ref 39–117)
ALT: 25 U/L (ref 0–35)
AST: 23 U/L (ref 0–37)
BUN: 11 mg/dL (ref 6–23)
CO2: 20 mEq/L (ref 19–32)
Calcium: 8.1 mg/dL — ABNORMAL LOW (ref 8.4–10.5)
Chloride: 100 mEq/L (ref 96–112)
Creatinine, Ser: 0.87 mg/dL (ref 0.50–1.10)
GFR calc Af Amer: >90 mL/min (ref >90)
GFR calc non Af Amer: 82 mL/min — ABNORMAL LOW (ref >90)
Glucose, Bld: 149 mg/dL — ABNORMAL HIGH (ref 70–99)
POTASSIUM: 3.4 meq/L — AB (ref 3.7–5.3)
SODIUM: 135 meq/L — AB (ref 137–147)
Total Bilirubin: 0.4 mg/dL (ref 0.3–1.2)
Total Protein: 7 g/dL (ref 6.0–8.3)

## 2013-08-21 LAB — OCCULT BLOOD, POC DEVICE: Fecal Occult Bld: NEGATIVE

## 2013-08-21 MED ORDER — ONDANSETRON HCL 4 MG/2ML IJ SOLN
4.0000 mg | Freq: Once | INTRAMUSCULAR | Status: AC
  Administered 2013-08-21: 4 mg via INTRAVENOUS
  Filled 2013-08-21: qty 2

## 2013-08-21 MED ORDER — SODIUM CHLORIDE 0.9 % IV SOLN
Freq: Once | INTRAVENOUS | Status: AC
  Administered 2013-08-21: 03:00:00 via INTRAVENOUS

## 2013-08-21 NOTE — Discharge Instructions (Signed)
Diarrhea Diarrhea is frequent loose and watery bowel movements. It can cause you to feel weak and dehydrated. Dehydration can cause you to become tired and thirsty, have a dry mouth, and have decreased urination that often is dark yellow. Diarrhea is a sign of another problem, most often an infection that will not last long. In most cases, diarrhea typically lasts 2 3 days. However, it can last longer if it is a sign of something more serious. It is important to treat your diarrhea as directed by your caregive to lessen or prevent future episodes of diarrhea. CAUSES  Some common causes include:  Gastrointestinal infections caused by viruses, bacteria, or parasites.  Food poisoning or food allergies.  Certain medicines, such as antibiotics, chemotherapy, and laxatives.  Artificial sweeteners and fructose.  Digestive disorders. HOME CARE INSTRUCTIONS  Ensure adequate fluid intake (hydration): have 1 cup (8 oz) of fluid for each diarrhea episode. Avoid fluids that contain simple sugars or sports drinks, fruit juices, whole milk products, and sodas. Your urine should be clear or pale yellow if you are drinking enough fluids. Hydrate with an oral rehydration solution that you can purchase at pharmacies, retail stores, and online. You can prepare an oral rehydration solution at home by mixing the following ingredients together:    tsp table salt.   tsp baking soda.   tsp salt substitute containing potassium chloride.  1  tablespoons sugar.  1 L (34 oz) of water.  Certain foods and beverages may increase the speed at which food moves through the gastrointestinal (GI) tract. These foods and beverages should be avoided and include:  Caffeinated and alcoholic beverages.  High-fiber foods, such as raw fruits and vegetables, nuts, seeds, and whole grain breads and cereals.  Foods and beverages sweetened with sugar alcohols, such as xylitol, sorbitol, and mannitol.  Some foods may be well  tolerated and may help thicken stool including:  Starchy foods, such as rice, toast, pasta, low-sugar cereal, oatmeal, grits, baked potatoes, crackers, and bagels.  Bananas.  Applesauce.  Add probiotic-rich foods to help increase healthy bacteria in the GI tract, such as yogurt and fermented milk products.  Wash your hands well after each diarrhea episode.  Only take over-the-counter or prescription medicines as directed by your caregiver.  Take a warm bath to relieve any burning or pain from frequent diarrhea episodes. SEEK IMMEDIATE MEDICAL CARE IF:   You are unable to keep fluids down.  You have persistent vomiting.  You have blood in your stool, or your stools are black and tarry.  You do not urinate in 6 8 hours, or there is only a small amount of very dark urine.  You have abdominal pain that increases or localizes.  You have weakness, dizziness, confusion, or lightheadedness.  You have a severe headache.  Your diarrhea gets worse or does not get better.  You have a fever or persistent symptoms for more than 2 3 days.  You have a fever and your symptoms suddenly get worse. MAKE SURE YOU:   Understand these instructions.  Will watch your condition.  Will get help right away if you are not doing well or get worse. Document Released: 07/09/2002 Document Revised: 07/05/2012 Document Reviewed: 03/26/2012 [employer] Patient Information 2014 [employer].  

## 2013-08-21 NOTE — ED Notes (Signed)
Pt was seen at [facility] on Sunday night and was diagnosed with an intestinal infection  Pt states she has not gotten any better  Pt states she has nausea, vomiting, and diarrhea that had some blood in it  Pt described it as dark in color and looked like it had coffee grounds in it

## 2013-08-21 NOTE — ED Provider Notes (Signed)
CSN: [id number]     Arrival date & time 08/21/13  0148 History   First MD Initiated Contact with Patient 08/21/13 [phone]     Chief Complaint  Patient presents with  . Nausea  . Emesis  . Rectal Bleeding   (Consider location/radiation/quality/duration/timing/severity/associated sxs/prior Treatment) HPI Comments: Patient presents to the ED with a chief complaint of diarrhea.  She states that the diarrhea has been going on for several days.  She was seen at [facility] for the same and treated with cipro/flagyl.  She states that she has to have a BM about once every hour.  She endorses seeing some dark stool.  She states that she is in moderate pain that is worsened with moving her bowels.  She denies fevers, chills, nausea, or vomiting.  Last colonoscopy was 5 years ago and showed 1 cancerous polyp and 2 benign polyps.  She also has a history of colitis.  The history is provided by the patient. No language interpreter was used.    Past Medical History  Diagnosis Date  . Hypertension   . Colitis   . Hypothyroidism   . Headache(784.0)   . Fibroid, uterine    Past Surgical History  Procedure Laterality Date  . Abdominal hysterectomy    . Cholecystectomy    . Thyroid surgery    . Knee surgery    . Colonoscopy    . Polypectomy    . Tubal ligation     Family History  Problem Relation Age of Onset  . Anesthesia problems Neg Hx   . Hypotension Neg Hx   . Malignant hyperthermia Neg Hx   . Pseudochol deficiency Neg Hx    History  Substance Use Topics  . Smoking status: Never Smoker   . Smokeless tobacco: Not on file  . Alcohol Use: No   OB History   Grav Para Term Preterm Abortions TAB SAB Ect Mult Living   6 6 5 1      6      Review of Systems  All other systems reviewed and are negative.    Allergies  Review of patient's allergies indicates no known allergies.  Home Medications   Current Outpatient Rx  Name  Route  Sig  Dispense  Refill  . amLODipine (NORVASC) 5 MG tablet  Oral   Take 5 mg by mouth daily.         . ciprofloxacin (CIPRO) 500 MG tablet   Oral   Take 1 tablet (500 mg total) by mouth every 12 (twelve) hours.   20 tablet   0   . diphenoxylate-atropine (LOMOTIL) 2.5-0.025 MG per tablet   Oral   Take 1 tablet by mouth 4 (four) times daily as needed for diarrhea or loose stools.   16 tablet   0   . metroNIDAZOLE (FLAGYL) 500 MG tablet   Oral   Take 1 tablet (500 mg total) by mouth 3 (three) times daily.   30 tablet   0   . ondansetron (ZOFRAN ODT) 8 MG disintegrating tablet   Oral   Take 1 tablet (8 mg total) by mouth every 8 (eight) hours as needed for nausea.   20 tablet   0   . tiZANidine (ZANAFLEX) 4 MG tablet   Oral   Take 4 mg by mouth at bedtime as needed (muscle spasms).           BP 139/103  Pulse 76  Temp(Src) 97.9 F (36.6 C) (Oral)  Resp 18  SpO2 97%  Physical Exam  Nursing note and vitals reviewed. Constitutional: She is oriented to person, place, and time. She appears well-developed and well-nourished.  HENT:  Head: Normocephalic and atraumatic.  Eyes: Conjunctivae and EOM are normal. Pupils are equal, round, and reactive to light.  Neck: Normal range of motion. Neck supple.  Cardiovascular: Normal rate and regular rhythm.  Exam reveals no gallop and no friction rub.   No murmur heard. Pulmonary/Chest: Effort normal and breath sounds normal. No respiratory distress. She has no wheezes. She has no rales. She exhibits no tenderness.  Abdominal: Soft. Bowel sounds are normal. She exhibits no distension and no mass. There is no tenderness. There is no rebound and no guarding.  Diffuse abdominal discomfort, without focal tenderness, no RLQ tenderness or pain at McBurney's point, no RUQ tenderness or Murphy's sign, no left sided abdominal tenderness  Musculoskeletal: Normal range of motion. She exhibits no edema and no tenderness.  Neurological: She is alert and oriented to person, place, and time.  Skin: Skin is  warm and dry.  Psychiatric: She has a normal mood and affect. Her behavior is normal. Judgment and thought content normal.    ED Course  Procedures (including critical care time) Results for orders placed during the hospital encounter of 08/21/13  CBC WITH DIFFERENTIAL      Result Value Range   WBC 4.6  4.0 - 10.5 K/uL   RBC 4.87  3.87 - 5.11 MIL/uL   Hemoglobin 14.7  12.0 - 15.0 g/dL   HCT 41.8  36.0 - 46.0 %   MCV 85.8  78.0 - 100.0 fL   MCH 30.2  26.0 - 34.0 pg   MCHC 35.2  30.0 - 36.0 g/dL   RDW 12.5  11.5 - 15.5 %   Platelets 274  150 - 400 K/uL   Neutrophils Relative % 33 (*) 43 - 77 %   Neutro Abs 1.5 (*) 1.7 - 7.7 K/uL   Lymphocytes Relative 52 (*) 12 - 46 %   Lymphs Abs 2.4  0.7 - 4.0 K/uL   Monocytes Relative 13 (*) 3 - 12 %   Monocytes Absolute 0.6  0.1 - 1.0 K/uL   Eosinophils Relative 2  0 - 5 %   Eosinophils Absolute 0.1  0.0 - 0.7 K/uL   Basophils Relative 0  0 - 1 %   Basophils Absolute 0.0  0.0 - 0.1 K/uL  COMPREHENSIVE METABOLIC PANEL      Result Value Range   Sodium 135 (*) 137 - 147 mEq/L   Potassium 3.4 (*) 3.7 - 5.3 mEq/L   Chloride 100  96 - 112 mEq/L   CO2 20  19 - 32 mEq/L   Glucose, Bld 149 (*) 70 - 99 mg/dL   BUN 11  6 - 23 mg/dL   Creatinine, Ser 0.87  0.50 - 1.10 mg/dL   Calcium 8.1 (*) 8.4 - 10.5 mg/dL   Total Protein 7.0  6.0 - 8.3 g/dL   Albumin 3.5  3.5 - 5.2 g/dL   AST 23  0 - 37 U/L   ALT 25  0 - 35 U/L   Alkaline Phosphatase 99  39 - 117 U/L   Total Bilirubin 0.4  0.3 - 1.2 mg/dL   GFR calc non Af Amer 82 (*) >90 mL/min   GFR calc Af Amer >90  >90 mL/min  LIPASE, BLOOD      Result Value Range   Lipase 24  11 - 59 U/L  OCCULT BLOOD, POC  DEVICE      Result Value Range   Fecal Occult Bld NEGATIVE  NEGATIVE   No results found.    EKG Interpretation   None       MDM   1. Diarrhea     Patient with diarrhea.  Being treated with cipro and flagyl.  Will check labs and give fluids.  5:18 AM Patient reassessed.  Feels  improved.  Abdomen re-examined.  Remains benign.  Patient with diarrhea. Labs are unremarkable. No evidence of sepsis or SIRS. Patient is receiving Cipro and Flagyl. Discussed with Dr. Kathrynn Humble.  Discharge to home.  Return precautions are given.  Patient is stable and ready for discharge.    Montine Circle, PA-C 08/21/13 [phone]

## 2013-08-21 NOTE — ED Provider Notes (Signed)
Medical screening examination/treatment/procedure(s) were performed by non-physician practitioner and as supervising physician I was immediately available for consultation/collaboration.  EKG Interpretation   None        Varney Biles, MD 08/21/13 [phone]

## 2013-08-24 LAB — STOOL CULTURE: Special Requests: NORMAL

## 2013-10-19 ENCOUNTER — Encounter (HOSPITAL_COMMUNITY): Payer: Self-pay | Admitting: *Deleted

## 2013-10-19 ENCOUNTER — Inpatient Hospital Stay (HOSPITAL_COMMUNITY)
Admission: AD | Admit: 2013-10-19 | Discharge: 2013-10-19 | Disposition: A | Discharge status: Home / Self Care | Payer: BC Managed Care – PPO | Source: Ambulatory Visit | Attending: Obstetrics & Gynecology | Admitting: Obstetrics & Gynecology

## 2013-10-19 DIAGNOSIS — N949 Unspecified condition associated with female genital organs and menstrual cycle: Secondary | ICD-10-CM | POA: Insufficient documentation

## 2013-10-19 DIAGNOSIS — L259 Unspecified contact dermatitis, unspecified cause: Secondary | ICD-10-CM

## 2013-10-19 DIAGNOSIS — B373 Candidiasis of vulva and vagina: Secondary | ICD-10-CM | POA: Insufficient documentation

## 2013-10-19 DIAGNOSIS — I10 Essential (primary) hypertension: Secondary | ICD-10-CM | POA: Insufficient documentation

## 2013-10-19 DIAGNOSIS — K5289 Other specified noninfective gastroenteritis and colitis: Secondary | ICD-10-CM | POA: Insufficient documentation

## 2013-10-19 DIAGNOSIS — E119 Type 2 diabetes mellitus without complications: Secondary | ICD-10-CM | POA: Insufficient documentation

## 2013-10-19 DIAGNOSIS — B3731 Acute candidiasis of vulva and vagina: Secondary | ICD-10-CM | POA: Insufficient documentation

## 2013-10-19 DIAGNOSIS — L293 Anogenital pruritus, unspecified: Secondary | ICD-10-CM | POA: Insufficient documentation

## 2013-10-19 HISTORY — DX: Herpesviral infection, unspecified: B00.9

## 2013-10-19 HISTORY — DX: Type 2 diabetes mellitus without complications: E11.9

## 2013-10-19 LAB — URINALYSIS, ROUTINE W REFLEX MICROSCOPIC
Bilirubin Urine: NEGATIVE
GLUCOSE, UA: 500 mg/dL — AB
HGB URINE DIPSTICK: NEGATIVE
Ketones, ur: 15 mg/dL — AB
Leukocytes, UA: NEGATIVE
Nitrite: NEGATIVE
PH: 5 (ref 5.0–8.0)
PROTEIN: NEGATIVE mg/dL
Specific Gravity, Urine: 1.025 (ref 1.005–1.030)
Urobilinogen, UA: 0.2 mg/dL (ref 0.0–1.0)

## 2013-10-19 LAB — WET PREP, GENITAL
Clue Cells Wet Prep HPF POC: NONE SEEN
TRICH WET PREP: NONE SEEN

## 2013-10-19 MED ORDER — NYSTATIN-TRIAMCINOLONE 100000-0.1 UNIT/GM-% EX OINT: 1.0000 "application " | TOPICAL_OINTMENT | Freq: Two times a day (BID) | CUTANEOUS | Status: DC

## 2013-10-19 MED ORDER — FLUCONAZOLE 150 MG PO TABS: 150.0000 mg | ORAL_TABLET | Freq: Once | ORAL | Status: DC

## 2013-10-19 MED ORDER — FLUCONAZOLE 150 MG PO TABS
150.0000 mg | ORAL_TABLET | Freq: Once | ORAL | Status: AC
  Administered 2013-10-19: 150 mg via ORAL
  Filled 2013-10-19: qty 1

## 2013-10-19 NOTE — MAU Note (Signed)
Patient states she has had a vaginal irritation with itching for about 4 days. Has a slight "pinch" with urination.

## 2013-10-19 NOTE — Discharge Instructions (Signed)
Candida Infection, Adult A candida infection (also called yeast, fungus and Monilia infection) is an overgrowth of yeast that can occur anywhere on the body. A yeast infection commonly occurs in warm, moist body areas. Usually, the infection remains localized but can spread to become a systemic infection. A yeast infection may be a sign of a more severe disease such as diabetes, leukemia, or AIDS. A yeast infection can occur in both men and women. In women, Candida vaginitis is a vaginal infection. It is one of the most common causes of vaginitis. Men usually do not have symptoms or know they have an infection until other problems develop. Men may find out they have a yeast infection because their sex partner has a yeast infection. Uncircumcised men are more likely to get a yeast infection than circumcised men. This is because the uncircumcised glans is not exposed to air and does not remain as dry as that of a circumcised glans. Older adults may develop yeast infections around dentures. CAUSES  Women  Antibiotics.  Steroid medication taken for a long time.  Being overweight (obese).  Diabetes.  Poor immune condition.  Certain serious medical conditions.  Immune suppressive medications for organ transplant patients.  Chemotherapy.  Pregnancy.  Menstration.  Stress and fatigue.  Intravenous drug use.  Oral contraceptives.  Wearing tight-fitting clothes in the crotch area.  Catching it from a sex partner who has a yeast infection.  Spermicide.  Intravenous, urinary, or other catheters. Men  Catching it from a sex partner who has a yeast infection.  Having oral or anal sex with a person who has the infection.  Spermicide.  Diabetes.  Antibiotics.  Poor immune system.  Medications that suppress the immune system.  Intravenous drug use.  Intravenous, urinary, or other catheters. SYMPTOMS  Women  Thick, white vaginal discharge.  Vaginal itching.  Redness and  swelling in and around the vagina.  Irritation of the lips of the vagina and perineum.  Blisters on the vaginal lips and perineum.  Painful sexual intercourse.  Low blood sugar (hypoglycemia).  Painful urination.  Bladder infections.  Intestinal problems such as constipation, indigestion, bad breath, bloating, increase in gas, diarrhea, or loose stools. Men  Men may develop intestinal problems such as constipation, indigestion, bad breath, bloating, increase in gas, diarrhea, or loose stools.  Dry, cracked skin on the penis with itching or discomfort.  Jock itch.  Dry, flaky skin.  Athlete's foot.  Hypoglycemia. DIAGNOSIS  Women  A history and an exam are performed.  The discharge may be examined under a microscope.  A culture may be taken of the discharge. Men  A history and an exam are performed.  Any discharge from the penis or areas of cracked skin will be looked at under the microscope and cultured.  Stool samples may be cultured. TREATMENT  Women  Vaginal antifungal suppositories and creams.  Medicated creams to decrease irritation and itching on the outside of the vagina.  Warm compresses to the perineal area to decrease swelling and discomfort.  Oral antifungal medications.  Medicated vaginal suppositories or cream for repeated or recurrent infections.  Wash and dry the irritation areas before applying the cream.  Eating yogurt with lactobacillus may help with prevention and treatment.  Sometimes painting the vagina with gentian violet solution may help if creams and suppositories do not work. Men  Antifungal creams and oral antifungal medications.  Sometimes treatment must continue for 30 days after the symptoms go away to prevent recurrence. HOME CARE   INSTRUCTIONS  Women  Use cotton underwear and avoid tight-fitting clothing.  Avoid colored, scented toilet paper and deodorant tampons or pads.  Do not douche.  Keep your diabetes  under control.  Finish all the prescribed medications.  Keep your skin clean and dry.  Consume milk or yogurt with lactobacillus active culture regularly. If you get frequent yeast infections and think that is what the infection is, there are over-the-counter medications that you can get. If the infection does not show healing in 3 days, talk to your caregiver.  Tell your sex partner you have a yeast infection. Your partner may need treatment also, especially if your infection does not clear up or recurs. Men  Keep your skin clean and dry.  Keep your diabetes under control.  Finish all prescribed medications.  Tell your sex partner that you have a yeast infection so they can be treated if necessary. SEEK MEDICAL CARE IF:   Your symptoms do not clear up or worsen in one week after treatment.  You have an oral temperature above 102 F (38.9 C).  You have trouble swallowing or eating for a prolonged time.  You develop blisters on and around your vagina.  You develop vaginal bleeding and it is not your menstrual period.  You develop abdominal pain.  You develop intestinal problems as mentioned above.  You get weak or lightheaded.  You have painful or increased urination.  You have pain during sexual intercourse. MAKE SURE YOU:   Understand these instructions.  Will watch your condition.  Will get help right away if you are not doing well or get worse. Document Released: 08/26/2004 Document Revised: 10/11/2011 Document Reviewed: 12/08/2009 Exit[employer] Patient Information 2014 [employer].  

## 2013-10-19 NOTE — MAU Provider Note (Signed)
History     CSN: [id number]  Arrival date and time: 10/19/13 1962   None     Chief Complaint  Patient presents with  . Vaginal Pain   HPI  Pt is a 17 yp [id number] female with a PMH of DM2, yeast infections, HSV2, and current oral steriod usage d/t colitis presenting with 4 days of vaginal infection. Pt states that over the past 4 days she has experienced persistent vaginal itching. She states that the itching feels similar to past yeast infections that she has had, however the past infections have caused itching on the inside of her vagina and she describes the itching today as being more vulvar in nature. Pt has tried using yogurt, grain alcohol, and OTC maximum strength vaginal cream for the itching however these have provided no relief. Concerned that the itching may be an outbreak of her HSV, pt began taking valtrex 4 days ago w/o relief. Pt began using a new douche, summers eve, last week as well. Pt states that she had oral sex performed on her last weekend with a partner that she has had for awhile.  She had experienced no dysuria or recent subjective fever. She denies any vaginal discharge. Pt has DM2 however has been told that her DM had "gone into remission" 2 years ago. She takes no medication for this and does not check her blood sugar levels.  Past Medical History  Diagnosis Date  . Hypertension   . Colitis   . Hypothyroidism   . Headache(784.0)   . Fibroid, uterine   . Diabetes mellitus without complication     Past Surgical History  Procedure Laterality Date  . Abdominal hysterectomy    . Cholecystectomy    . Thyroid surgery    . Knee surgery    . Colonoscopy    . Polypectomy    . Tubal ligation      Family History  Problem Relation Age of Onset  . Anesthesia problems Neg Hx   . Hypotension Neg Hx   . Malignant hyperthermia Neg Hx   . Pseudochol deficiency Neg Hx     History  Substance Use Topics  . Smoking status: Never Smoker   . Smokeless tobacco:  Not on file  . Alcohol Use: No    Allergies: No Known Allergies  Prescriptions prior to admission  Medication Sig Dispense Refill  . amLODipine (NORVASC) 5 MG tablet Take 5 mg by mouth daily.      . ciprofloxacin (CIPRO) 500 MG tablet Take 1 tablet (500 mg total) by mouth every 12 (twelve) hours.  20 tablet  0  . diphenoxylate-atropine (LOMOTIL) 2.5-0.025 MG per tablet Take 1 tablet by mouth 4 (four) times daily as needed for diarrhea or loose stools.  16 tablet  0  . metroNIDAZOLE (FLAGYL) 500 MG tablet Take 1 tablet (500 mg total) by mouth 3 (three) times daily.  30 tablet  0  . ondansetron (ZOFRAN ODT) 8 MG disintegrating tablet Take 1 tablet (8 mg total) by mouth every 8 (eight) hours as needed for nausea.  20 tablet  0  . tiZANidine (ZANAFLEX) 4 MG tablet Take 4 mg by mouth at bedtime as needed (muscle spasms).         Review of Systems  Constitutional: Negative for fever, chills and malaise/fatigue.  Respiratory: Negative for cough and wheezing.   Cardiovascular: Negative for chest pain.  Gastrointestinal: Negative for nausea, vomiting and abdominal pain.  Genitourinary: Positive for frequency (longstanding d/t DM). Negative for  dysuria.  Musculoskeletal: Negative for back pain and myalgias.  Skin: Positive for itching (vulvar ).  Neurological: Negative for dizziness and headaches.   Physical Exam   Blood pressure 155/111, pulse 75, temperature 98.2 F (36.8 C), temperature source Oral, resp. rate 18, height 5' 4"  (1.626 m), weight 96.525 kg (212 lb 12.8 oz).  Physical Exam  Constitutional: She appears well-developed and well-nourished. No distress.  Cardiovascular: Normal rate.   Respiratory: Effort normal and breath sounds normal. No respiratory distress. She has no wheezes. She has no rales.  GI: Soft. She exhibits no distension. There is no tenderness.  Genitourinary: Uterus is not tender. Cervix exhibits discharge (white clumpy). Cervix exhibits no motion tenderness.  Right adnexum displays no tenderness. Left adnexum displays no tenderness.  Dry skin noted around the introitus. No signs of lesion.  Neurological: She is alert.    MAU Course  Procedures Results for orders placed during the hospital encounter of 10/19/13 (from the past 24 hour(s))  URINALYSIS, ROUTINE W REFLEX MICROSCOPIC     Status: Abnormal   Collection Time    10/19/13 10:00 AM      Result Value Ref Range   Color, Urine YELLOW  YELLOW   APPearance CLEAR  CLEAR   Specific Gravity, Urine 1.025  1.005 - 1.030   pH 5.0  5.0 - 8.0   Glucose, UA 500 (*) NEGATIVE mg/dL   Hgb urine dipstick NEGATIVE  NEGATIVE   Bilirubin Urine NEGATIVE  NEGATIVE   Ketones, ur 15 (*) NEGATIVE mg/dL   Protein, ur NEGATIVE  NEGATIVE mg/dL   Urobilinogen, UA 0.2  0.0 - 1.0 mg/dL   Nitrite NEGATIVE  NEGATIVE   Leukocytes, UA NEGATIVE  NEGATIVE   MDM   Assessment and Plan   A: #Yeast Infections- This pt most likely has a yeast infection.  #Vaginal dryness- likely d/t the usage of multiple products over the past 10 days.   P: Wet prep and G/C have been performed. Will treat empirically with fluconazole. Pt told to discontinue the myriad of vaginal products that she has been using. D/t the presence of glucose and ketones in her urine, pt has been told to follow-up with her PCP regarding possible restart of DM medications.  Jeanett Schlein T 10/19/2013, 10:20 AM  I have seen the patient with the resident/student and agree with the above.  Mathis Bud

## 2013-10-20 LAB — GC/CHLAMYDIA PROBE AMP
CT Probe RNA: NEGATIVE
GC Probe RNA: NEGATIVE

## 2013-10-25 NOTE — MAU Provider Note (Signed)
Attestation of Attending Supervision of Advanced Practitioner (CNM/NP): Evaluation and management procedures were performed by the Advanced Practitioner under my supervision and collaboration. I have reviewed the Advanced Practitioner's note and chart, and I agree with the management and plan.  Angela Cisneros H. 7:56 PM

## 2013-12-26 ENCOUNTER — Other Ambulatory Visit (HOSPITAL_COMMUNITY): Payer: Self-pay | Admitting: Orthopaedic Surgery

## 2013-12-26 ENCOUNTER — Ambulatory Visit (HOSPITAL_COMMUNITY)
Admission: RE | Admit: 2013-12-26 | Discharge: 2013-12-26 | Disposition: A | Discharge status: Home / Self Care | Payer: BC Managed Care – PPO | Source: Ambulatory Visit | Attending: Orthopaedic Surgery | Admitting: Orthopaedic Surgery

## 2013-12-26 DIAGNOSIS — M949 Disorder of cartilage, unspecified: Secondary | ICD-10-CM

## 2013-12-26 DIAGNOSIS — R252 Cramp and spasm: Secondary | ICD-10-CM | POA: Insufficient documentation

## 2013-12-26 DIAGNOSIS — M899 Disorder of bone, unspecified: Secondary | ICD-10-CM | POA: Insufficient documentation

## 2013-12-26 DIAGNOSIS — M79609 Pain in unspecified limb: Secondary | ICD-10-CM | POA: Insufficient documentation

## 2013-12-26 DIAGNOSIS — M898X6 Other specified disorders of bone, lower leg: Secondary | ICD-10-CM

## 2013-12-26 LAB — POCT I-STAT CREATININE: Creatinine, Ser: 1 mg/dL (ref 0.50–1.10)

## 2013-12-26 MED ORDER — GADOBENATE DIMEGLUMINE 529 MG/ML IV SOLN
20.0000 mL | Freq: Once | INTRAVENOUS | Status: AC | PRN
  Administered 2013-12-26: 20 mL via INTRAVENOUS

## 2014-03-11 ENCOUNTER — Emergency Department: Payer: Self-pay | Admitting: Emergency Medicine

## 2014-06-03 ENCOUNTER — Encounter (HOSPITAL_COMMUNITY): Payer: Self-pay | Admitting: *Deleted

## 2014-09-04 ENCOUNTER — Emergency Department (HOSPITAL_COMMUNITY): Payer: BLUE CROSS/BLUE SHIELD

## 2014-09-04 ENCOUNTER — Encounter (HOSPITAL_COMMUNITY): Payer: Self-pay | Admitting: Physical Medicine and Rehabilitation

## 2014-09-04 ENCOUNTER — Emergency Department (HOSPITAL_COMMUNITY)
Admission: EM | Admit: 2014-09-04 | Discharge: 2014-09-04 | Disposition: A | Discharge status: Home / Self Care | Payer: BLUE CROSS/BLUE SHIELD | Attending: Emergency Medicine | Admitting: Emergency Medicine

## 2014-09-04 DIAGNOSIS — I10 Essential (primary) hypertension: Secondary | ICD-10-CM | POA: Diagnosis not present

## 2014-09-04 DIAGNOSIS — Z79899 Other long term (current) drug therapy: Secondary | ICD-10-CM | POA: Insufficient documentation

## 2014-09-04 DIAGNOSIS — Z8619 Personal history of other infectious and parasitic diseases: Secondary | ICD-10-CM | POA: Insufficient documentation

## 2014-09-04 DIAGNOSIS — Z8742 Personal history of other diseases of the female genital tract: Secondary | ICD-10-CM | POA: Insufficient documentation

## 2014-09-04 DIAGNOSIS — J9801 Acute bronchospasm: Secondary | ICD-10-CM | POA: Diagnosis not present

## 2014-09-04 DIAGNOSIS — R0981 Nasal congestion: Secondary | ICD-10-CM | POA: Diagnosis present

## 2014-09-04 DIAGNOSIS — Z8719 Personal history of other diseases of the digestive system: Secondary | ICD-10-CM | POA: Insufficient documentation

## 2014-09-04 DIAGNOSIS — J40 Bronchitis, not specified as acute or chronic: Secondary | ICD-10-CM | POA: Diagnosis not present

## 2014-09-04 DIAGNOSIS — E119 Type 2 diabetes mellitus without complications: Secondary | ICD-10-CM | POA: Diagnosis not present

## 2014-09-04 DIAGNOSIS — Z7952 Long term (current) use of systemic steroids: Secondary | ICD-10-CM | POA: Diagnosis not present

## 2014-09-04 DIAGNOSIS — J209 Acute bronchitis, unspecified: Secondary | ICD-10-CM

## 2014-09-04 MED ORDER — IPRATROPIUM-ALBUTEROL 0.5-2.5 (3) MG/3ML IN SOLN
3.0000 mL | Freq: Once | RESPIRATORY_TRACT | Status: AC
  Administered 2014-09-04: 3 mL via RESPIRATORY_TRACT
  Filled 2014-09-04: qty 3

## 2014-09-04 MED ORDER — HYDROCOD POLST-CHLORPHEN POLST 10-8 MG/5ML PO LQCR
5.0000 mL | Freq: Once | ORAL | Status: AC
  Administered 2014-09-04: 5 mL via ORAL
  Filled 2014-09-04: qty 5

## 2014-09-04 MED ORDER — ALBUTEROL SULFATE HFA 108 (90 BASE) MCG/ACT IN AERS
2.0000 | INHALATION_SPRAY | Freq: Once | RESPIRATORY_TRACT | Status: AC
  Administered 2014-09-04: 2 via RESPIRATORY_TRACT
  Filled 2014-09-04: qty 6.7

## 2014-09-04 MED ORDER — ALBUTEROL (5 MG/ML) CONTINUOUS INHALATION SOLN: 10.0000 mg/h | INHALATION_SOLUTION | Freq: Once | RESPIRATORY_TRACT | Status: DC

## 2014-09-04 MED ORDER — PREDNISONE 20 MG PO TABS
60.0000 mg | ORAL_TABLET | Freq: Once | ORAL | Status: AC
  Administered 2014-09-04: 60 mg via ORAL
  Filled 2014-09-04: qty 3

## 2014-09-04 MED ORDER — PREDNISONE (PAK) 10 MG PO TABS: ORAL_TABLET | Freq: Every day | ORAL | Status: DC

## 2014-09-04 MED ORDER — HYDROCOD POLST-CHLORPHEN POLST 10-8 MG/5ML PO LQCR: 5.0000 mL | Freq: Two times a day (BID) | ORAL | Status: DC | PRN

## 2014-09-04 NOTE — ED Notes (Signed)
Pt presents to department for evaluation of cough, ongoing x4 days. Also reports sinus congestion. Respirations unlabored. Pt is alert and oriented x4.

## 2014-09-04 NOTE — ED Provider Notes (Signed)
CSN: [id number]     Arrival date & time 09/04/14  0926 History   First MD Initiated Contact with Patient 09/04/14 [phone]     Chief Complaint  Patient presents with  . Cough  . Nasal Congestion     (Consider location/radiation/quality/duration/timing/severity/associated sxs/prior Treatment) The history is provided by the patient.     Patient presents with 3-4 days of cough productive of green sputum, nasal congestion with green nasal discharge, SOB, chest pain only with coughing.  No pleuritic pain.  Has been taking Mucinex and Nyquil without improvement.  Denies smoking, hx asthma.  No hx blood clots.  Denies recent immobilization or leg swelling.  No known sick contacts.    Past Medical History  Diagnosis Date  . Hypertension   . Colitis   . Hypothyroidism   . Headache(784.0)   . Fibroid, uterine   . Diabetes mellitus without complication   . Herpes    Past Surgical History  Procedure Laterality Date  . Abdominal hysterectomy    . Cholecystectomy    . Thyroid surgery    . Knee surgery    . Colonoscopy    . Polypectomy    . Tubal ligation     Family History  Problem Relation Age of Onset  . Anesthesia problems Neg Hx   . Hypotension Neg Hx   . Malignant hyperthermia Neg Hx   . Pseudochol deficiency Neg Hx    History  Substance Use Topics  . Smoking status: Never Smoker   . Smokeless tobacco: Not on file  . Alcohol Use: Yes     Comment: special occasions   OB History    Gravida Para Term Preterm AB TAB SAB Ectopic Multiple Living   6 6 5 1      6      Review of Systems  All other systems reviewed and are negative.     Allergies  Review of patient's allergies indicates no known allergies.  Home Medications   Prior to Admission medications   Medication Sig Start Date End Date Taking? Authorizing Provider  amLODipine (NORVASC) 5 MG tablet Take 5 mg by mouth daily.    Historical Provider, MD  fluconazole (DIFLUCAN) 150 MG tablet Take 1 tablet (150 mg total)  by mouth once. 10/19/13   Mathis Bud, CNM  nystatin-triamcinolone ointment (MYCOLOG) Apply 1 application topically 2 (two) times daily. 10/19/13   Heather Erby Pian, CNM  predniSONE (DELTASONE) 10 MG tablet Take 10 mg by mouth daily with breakfast. Taper every 7 days.    Historical Provider, MD  valACYclovir (VALTREX) 500 MG tablet Take 1,000 mg by mouth daily.    Historical Provider, MD   BP 138/97 mmHg  Pulse 107  Temp(Src) 98.3 F (36.8 C) (Oral)  Resp 22  SpO2 90% Physical Exam  Constitutional: She appears well-developed and well-nourished. No distress.  HENT:  Head: Normocephalic and atraumatic.  Neck: Normal range of motion. Neck supple.  Cardiovascular: Normal rate, regular rhythm and intact distal pulses.   Pulmonary/Chest: Effort normal. No respiratory distress. She has wheezes. She has no rales.  Abdominal: Soft. She exhibits no distension. There is no tenderness. There is no rebound and no guarding.  Musculoskeletal: She exhibits no edema or tenderness.  Neurological: She is alert.  Skin: She is not diaphoretic.  Nursing note and vitals reviewed.   ED Course  Procedures (including critical care time) Labs Review Labs Reviewed - No data to display  Imaging Review Dg Chest 2 View  09/04/2014  CLINICAL DATA:  Shortness of breath with cough for several days. Initial encounter.  EXAM: CHEST  2 VIEW  COMPARISON:  07/09/2008 radiographs.  FINDINGS: The heart size and mediastinal contours are normal. The lungs are clear. There is no pleural effusion or pneumothorax. No acute osseous findings are identified.  IMPRESSION: Stable examination.  No active cardiopulmonary process.   Electronically Signed   By: Camie Patience M.D.   On: 09/04/2014 10:29     EKG Interpretation None       11:30 AM After second neb treatment pt feeling much better.  Lungs CTAB.    Wells' Criteria for PE, 1.5 points for tachycardia.  Low risk group.    MDM   Final diagnoses:   Bronchitis with bronchospasm    Afebrile, nontoxic patient with 3-4 days of respiratory symptoms suggestive of viral syndrome.  Wheezing on exam.  O2 90% on initial reading but 100% the remainder of the visit.   Neb treatment x 2 with great improvement.  CXR negative.  Doubt PE.   D/C home with albuterol, prednisone, tussionex.  Discussed result, findings, treatment, and follow up  with patient.  Pt given return precautions.  Pt verbalizes understanding and agrees with plan.         Clayton Bibles, PA-C 09/04/14 [place], PA-C 09/04/14 1415  Virgel Manifold, MD 09/05/14 [phone]

## 2014-09-04 NOTE — ED Notes (Signed)
Pt verbalized understanding of discharge instructions and prescriptions and has no further questions.

## 2014-09-04 NOTE — ED Notes (Signed)
PA in room to listen to pt's breathing

## 2014-09-04 NOTE — Discharge Instructions (Signed)
Read the information below.  Use the prescribed medication as directed.  Please discuss all new medications with your pharmacist.  You may return to the Emergency Department at any time for worsening condition or any new symptoms that concern you.   If you develop worsening shortness of breath, uncontrolled wheezing, severe chest pain, or fevers despite using tylenol and/or ibuprofen, return for a recheck.       Acute Bronchitis Bronchitis is inflammation of the airways that extend from the windpipe into the lungs (bronchi). The inflammation often causes mucus to develop. This leads to a cough, which is the most common symptom of bronchitis.  In acute bronchitis, the condition usually develops suddenly and goes away over time, usually in a couple weeks. Smoking, allergies, and asthma can make bronchitis worse. Repeated episodes of bronchitis may cause further lung problems.  CAUSES Acute bronchitis is most often caused by the same virus that causes a cold. The virus can spread from person to person (contagious) through coughing, sneezing, and touching contaminated objects. SIGNS AND SYMPTOMS   Cough.   Fever.   Coughing up mucus.   Body aches.   Chest congestion.   Chills.   Shortness of breath.   Sore throat.  DIAGNOSIS  Acute bronchitis is usually diagnosed through a physical exam. Your health care provider will also ask you questions about your medical history. Tests, such as chest X-rays, are sometimes done to rule out other conditions.  TREATMENT  Acute bronchitis usually goes away in a couple weeks. Oftentimes, no medical treatment is necessary. Medicines are sometimes given for relief of fever or cough. Antibiotic medicines are usually not needed but may be prescribed in certain situations. In some cases, an inhaler may be recommended to help reduce shortness of breath and control the cough. A cool mist vaporizer may also be used to help thin bronchial secretions and make  it easier to clear the chest.  HOME CARE INSTRUCTIONS  Get plenty of rest.   Drink enough fluids to keep your urine clear or pale yellow (unless you have a medical condition that requires fluid restriction). Increasing fluids may help thin your respiratory secretions (sputum) and reduce chest congestion, and it will prevent dehydration.   Take medicines only as directed by your health care provider.  If you were prescribed an antibiotic medicine, finish it all even if you start to feel better.  Avoid smoking and secondhand smoke. Exposure to cigarette smoke or irritating chemicals will make bronchitis worse. If you are a smoker, consider using nicotine gum or skin patches to help control withdrawal symptoms. Quitting smoking will help your lungs heal faster.   Reduce the chances of another bout of acute bronchitis by washing your hands frequently, avoiding people with cold symptoms, and trying not to touch your hands to your mouth, nose, or eyes.   Keep all follow-up visits as directed by your health care provider.  SEEK MEDICAL CARE IF: Your symptoms do not improve after 1 week of treatment.  SEEK IMMEDIATE MEDICAL CARE IF:  You develop an increased fever or chills.   You have chest pain.   You have severe shortness of breath.  You have bloody sputum.   You develop dehydration.  You faint or repeatedly feel like you are going to pass out.  You develop repeated vomiting.  You develop a severe headache. MAKE SURE YOU:   Understand these instructions.  Will watch your condition.  Will get help right away if you are not doing  well or get worse. Document Released: 08/26/2004 Document Revised: 12/03/2013 Document Reviewed: 01/09/2013 [facility] Patient Information 2015 [place], [place]. This information is not intended to replace advice given to you by your health care provider. Make sure you discuss any questions you have with your health care provider.

## 2014-09-13 ENCOUNTER — Encounter (HOSPITAL_COMMUNITY): Payer: Self-pay | Admitting: Emergency Medicine

## 2014-09-13 ENCOUNTER — Emergency Department (INDEPENDENT_AMBULATORY_CARE_PROVIDER_SITE_OTHER)
Admission: EM | Admit: 2014-09-13 | Discharge: 2014-09-13 | Disposition: A | Discharge status: Home / Self Care | Payer: BLUE CROSS/BLUE SHIELD | Source: Home / Self Care | Attending: Emergency Medicine | Admitting: Emergency Medicine

## 2014-09-13 DIAGNOSIS — J40 Bronchitis, not specified as acute or chronic: Secondary | ICD-10-CM

## 2014-09-13 MED ORDER — HYDROCOD POLST-CHLORPHEN POLST 10-8 MG/5ML PO LQCR: 5.0000 mL | Freq: Two times a day (BID) | ORAL | Status: DC | PRN

## 2014-09-13 MED ORDER — AZITHROMYCIN 250 MG PO TABS: ORAL_TABLET | ORAL | Status: DC

## 2014-09-13 NOTE — Discharge Instructions (Signed)
Take the Z-Pak as prescribed. Use the cough syrup as needed. You should be feeling better in the next few days. The cough will likely linger for several weeks. Follow-up as needed.

## 2014-09-13 NOTE — ED Notes (Signed)
Pt is here today for a cough and URI symptoms that she has had for 1 week, pt states that she is coughing up clear sputum, and says that she is having difficulty sleeping and she is having a difficult time catching her breath

## 2014-09-13 NOTE — ED Provider Notes (Signed)
CSN: [id number]     Arrival date & time 09/13/14  9476 History   First MD Initiated Contact with Patient 09/13/14 [phone]     Chief Complaint  Patient presents with  . Cough   (Consider location/radiation/quality/duration/timing/severity/associated sxs/prior Treatment) HPI She is a 44 year old woman here for evaluation of cough. She states for a little over a week she has had nasal congestion, rhinorrhea, cough. She was seen in the ER for these symptoms a week ago and treated with albuterol, prednisone, Tussionex. She states she does not feel any better with this treatment. She continues to have significant cough that is intermittently productive. She reports some intermittent nausea as well. No fevers or chills.  Past Medical History  Diagnosis Date  . Hypertension   . Colitis   . Hypothyroidism   . Headache(784.0)   . Fibroid, uterine   . Diabetes mellitus without complication   . Herpes    Past Surgical History  Procedure Laterality Date  . Abdominal hysterectomy    . Cholecystectomy    . Thyroid surgery    . Knee surgery    . Colonoscopy    . Polypectomy    . Tubal ligation     Family History  Problem Relation Age of Onset  . Anesthesia problems Neg Hx   . Hypotension Neg Hx   . Malignant hyperthermia Neg Hx   . Pseudochol deficiency Neg Hx    History  Substance Use Topics  . Smoking status: Never Smoker   . Smokeless tobacco: Not on file  . Alcohol Use: Yes     Comment: special occasions   OB History    Gravida Para Term Preterm AB TAB SAB Ectopic Multiple Living   6 6 5 1      6      Review of Systems  Constitutional: Negative for fever and chills.  HENT: Positive for congestion and rhinorrhea. Negative for sore throat.   Respiratory: Positive for cough. Negative for shortness of breath and wheezing.   Gastrointestinal: Positive for nausea. Negative for vomiting and diarrhea.  Musculoskeletal: Negative for myalgias.  Neurological: Negative for headaches.     Allergies  Review of patient's allergies indicates no known allergies.  Home Medications   Prior to Admission medications   Medication Sig Start Date End Date Taking? Authorizing Provider  Albuterol (PROVENTIL IN) Inhale into the lungs.   Yes Historical Provider, MD  hydrochlorothiazide (HYDRODIURIL) 12.5 MG tablet Take 12.5 mg by mouth daily.   Yes Historical Provider, MD  amLODipine (NORVASC) 5 MG tablet Take 5 mg by mouth daily.    Historical Provider, MD  azithromycin (ZITHROMAX Z-PAK) 250 MG tablet Take 2 pills today, then 1 pill daily until gone 09/13/14   Melony Overly, MD  chlorpheniramine-HYDROcodone [facility] PENNKINETIC ER) 10-8 MG/5ML LQCR Take 5 mLs by mouth every 12 (twelve) hours as needed for cough (and pain). 09/13/14   Melony Overly, MD  fluconazole (DIFLUCAN) 150 MG tablet Take 1 tablet (150 mg total) by mouth once. 10/19/13   Mathis Bud, CNM  nystatin-triamcinolone ointment (MYCOLOG) Apply 1 application topically 2 (two) times daily. 10/19/13   Heather Erby Pian, CNM  predniSONE (STERAPRED UNI-PAK) 10 MG tablet Take by mouth daily. Day 1: take 6 tabs.  Day 2: 5 tabs  Day 3: 4 tabs  Day 4: 3 tabs  Day 5: 2 tabs  Day 6: 1 tab 09/04/14   Clayton Bibles, PA-C  valACYclovir (VALTREX) 500 MG tablet Take 1,000 mg by  mouth daily.    Historical Provider, MD   BP 122/75 mmHg  Pulse 87  Temp(Src) 98 F (36.7 C) (Oral)  Wt 212 lb (96.163 kg)  SpO2 98% Physical Exam  Constitutional: She is oriented to person, place, and time. She appears well-developed and well-nourished. No distress.  HENT:  Nose: Mucosal edema and rhinorrhea present.  Mouth/Throat: Oropharynx is clear and moist. No oropharyngeal exudate.  Neck: Neck supple.  Cardiovascular: Normal rate, regular rhythm and normal heart sounds.   No murmur heard. Pulmonary/Chest: Effort normal and breath sounds normal. No respiratory distress. She has no wheezes. She has no rales.  Lymphadenopathy:    She has no  cervical adenopathy.  Neurological: She is alert and oriented to person, place, and time.    ED Course  Procedures (including critical care time) Labs Review Labs Reviewed - No data to display  Imaging Review No results found.   MDM   1. Bronchitis    We'll treat with Z-Pak. Refill of Tussionex provided. Follow-up as needed.    Melony Overly, MD 09/13/14 [phone]

## 2014-11-14 ENCOUNTER — Encounter (HOSPITAL_COMMUNITY)
Admission: RE | Admit: 2014-11-14 | Discharge: 2014-11-14 | Disposition: A | Discharge status: Home / Self Care | Payer: BLUE CROSS/BLUE SHIELD | Source: Ambulatory Visit | Attending: Specialist | Admitting: Specialist

## 2014-11-14 ENCOUNTER — Encounter (HOSPITAL_COMMUNITY): Payer: Self-pay

## 2014-11-14 DIAGNOSIS — Z01812 Encounter for preprocedural laboratory examination: Secondary | ICD-10-CM | POA: Diagnosis not present

## 2014-11-14 DIAGNOSIS — Z01810 Encounter for preprocedural cardiovascular examination: Secondary | ICD-10-CM | POA: Diagnosis not present

## 2014-11-14 LAB — BASIC METABOLIC PANEL
Anion gap: 7 (ref 5–15)
BUN: 19 mg/dL (ref 6–23)
CALCIUM: 9 mg/dL (ref 8.4–10.5)
CO2: 25 mmol/L (ref 19–32)
Chloride: 102 mmol/L (ref 96–112)
Creatinine, Ser: 0.95 mg/dL (ref 0.50–1.10)
GFR calc Af Amer: 85 mL/min — ABNORMAL LOW (ref >90)
GFR, EST NON AFRICAN AMERICAN: 73 mL/min — AB (ref >90)
Glucose, Bld: 105 mg/dL — ABNORMAL HIGH (ref 70–99)
Potassium: 5.2 mmol/L — ABNORMAL HIGH (ref 3.5–5.1)
Sodium: 134 mmol/L — ABNORMAL LOW (ref 135–145)

## 2014-11-14 LAB — CBC
HCT: 42.3 % (ref 36.0–46.0)
HEMOGLOBIN: 14.4 g/dL (ref 12.0–15.0)
MCH: 30 pg (ref 26.0–34.0)
MCHC: 34 g/dL (ref 30.0–36.0)
MCV: 88.1 fL (ref 78.0–100.0)
Platelets: 237 10*3/uL (ref 150–400)
RBC: 4.8 MIL/uL (ref 3.87–5.11)
RDW: 13 % (ref 11.5–15.5)
WBC: 7 10*3/uL (ref 4.0–10.5)

## 2014-11-14 NOTE — Pre-Procedure Instructions (Signed)
Angela Cisneros  11/14/2014   Your procedure is scheduled on:  11/18/14  Report to [facility] Admitting at 630 AM.  Call this number if you have problems the morning of surgery: [phone]   Remember:   Do not eat food or drink liquids after midnight.   Take these medicines the morning of surgery with A SIP OF WATER: norvasc   Do not wear jewelry, make-up or nail polish.  Do not wear lotions, powders, or perfumes. You may wear deodorant.  Do not shave 48 hours prior to surgery. Men may shave face and neck.  Do not bring valuables to the hospital.  [facility] is not responsible                  for any belongings or valuables.               Contacts, dentures or bridgework may not be worn into surgery.  Leave suitcase in the car. After surgery it may be brought to your room.  For patients admitted to the hospital, discharge time is determined by your                treatment team.               Patients discharged the day of surgery will not be allowed to drive  home.  Name and phone number of your driver: family  Special Instructions: Shower using CHG 2 nights before surgery and the night before surgery.  If you shower the day of surgery use CHG.  Use special wash - you have one bottle of CHG for all showers.  You should use approximately 1/3 of the bottle for each shower.   Please read over the following fact sheets that you were given: Pain Booklet, Coughing and Deep Breathing and Surgical Site Infection Prevention

## 2014-11-15 NOTE — H&P (Signed)
Angela Cisneros is an 44 y.o. female.   Chief Complaint:Increased panniculus from weightn loss with back pain and panniculitis HPI: Increased pain rashes and discomfort  bACK PAIN REFRACTORY WITH THE WEIGHT OF THE PANNICULUS  Pain and discomfort at the periumbilical area and bulging  Past Medical History  Diagnosis Date  . Hypertension   . Colitis   . Hypothyroidism   . Headache(784.0)   . Fibroid, uterine   . Diabetes mellitus without complication   . Herpes     Past Surgical History  Procedure Laterality Date  . Abdominal hysterectomy    . Cholecystectomy    . Thyroid surgery    . Knee surgery    . Colonoscopy    . Polypectomy    . Tubal ligation      Family History  Problem Relation Age of Onset  . Anesthesia problems Neg Hx   . Hypotension Neg Hx   . Malignant hyperthermia Neg Hx   . Pseudochol deficiency Neg Hx    Social History:  reports that she has never smoked. She does not have any smokeless tobacco history on file. She reports that she drinks alcohol. She reports that she does not use illicit drugs.  Allergies:  Allergies  Allergen Reactions  . Corn Starch Hives and Itching    Powder in gloves    No prescriptions prior to admission    Results for orders placed or performed during the hospital encounter of 11/14/14 (from the past 48 hour(s))  CBC     Status: None   Collection Time: 11/14/14  2:01 PM  Result Value Ref Range   WBC 7.0 4.0 - 10.5 K/uL   RBC 4.80 3.87 - 5.11 MIL/uL   Hemoglobin 14.4 12.0 - 15.0 g/dL   HCT 42.3 36.0 - 46.0 %   MCV 88.1 78.0 - 100.0 fL   MCH 30.0 26.0 - 34.0 pg   MCHC 34.0 30.0 - 36.0 g/dL   RDW 13.0 11.5 - 15.5 %   Platelets 237 150 - 400 K/uL  Basic metabolic panel     Status: Abnormal   Collection Time: 11/14/14  2:01 PM  Result Value Ref Range   Sodium 134 (L) 135 - 145 mmol/L   Potassium 5.2 (H) 3.5 - 5.1 mmol/L   Chloride 102 96 - 112 mmol/L   CO2 25 19 - 32 mmol/L   Glucose, Bld 105 (H) 70 - 99 mg/dL   BUN 19 6 - 23 mg/dL   Creatinine, Ser 0.95 0.50 - 1.10 mg/dL   Calcium 9.0 8.4 - 10.5 mg/dL   GFR calc non Af Amer 73 (L) >90 mL/min   GFR calc Af Amer 85 (L) >90 mL/min    Comment: (NOTE) The eGFR has been calculated using the CKD EPI equation. This calculation has not been validated in all clinical situations. eGFR's persistently <90 mL/min signify possible Chronic Kidney Disease.    Anion gap 7 5 - 15   No results found.  Review of Systems  Constitutional: Negative.   HENT: Negative.   Eyes: Negative.   Respiratory: Negative.   Cardiovascular: Negative.   Gastrointestinal: Positive for heartburn and nausea.  Genitourinary: Negative.   Musculoskeletal: Positive for back pain.  Skin: Positive for rash.  Neurological: Negative.   Endo/Heme/Allergies: Negative.   Psychiatric/Behavioral: Negative.     There were no vitals taken for this visit. 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

## 2014-11-18 ENCOUNTER — Ambulatory Visit (HOSPITAL_COMMUNITY): Payer: BLUE CROSS/BLUE SHIELD | Admitting: Anesthesiology

## 2014-11-18 ENCOUNTER — Observation Stay (HOSPITAL_COMMUNITY)
Admission: RE | Admit: 2014-11-18 | Discharge: 2014-11-19 | Disposition: A | Discharge status: Home / Self Care | Payer: BLUE CROSS/BLUE SHIELD | Source: Ambulatory Visit | Attending: Specialist | Admitting: Specialist

## 2014-11-18 ENCOUNTER — Encounter (HOSPITAL_COMMUNITY)
Admission: RE | Disposition: A | Discharge status: Home / Self Care | Payer: Self-pay | Source: Ambulatory Visit | Attending: Specialist

## 2014-11-18 ENCOUNTER — Encounter (HOSPITAL_COMMUNITY): Payer: Self-pay | Admitting: Surgery

## 2014-11-18 DIAGNOSIS — E039 Hypothyroidism, unspecified: Secondary | ICD-10-CM | POA: Insufficient documentation

## 2014-11-18 DIAGNOSIS — M793 Panniculitis, unspecified: Secondary | ICD-10-CM | POA: Diagnosis not present

## 2014-11-18 DIAGNOSIS — E65 Localized adiposity: Secondary | ICD-10-CM | POA: Insufficient documentation

## 2014-11-18 DIAGNOSIS — K429 Umbilical hernia without obstruction or gangrene: Secondary | ICD-10-CM | POA: Insufficient documentation

## 2014-11-18 DIAGNOSIS — Z9889 Other specified postprocedural states: Secondary | ICD-10-CM

## 2014-11-18 DIAGNOSIS — Z794 Long term (current) use of insulin: Secondary | ICD-10-CM | POA: Diagnosis not present

## 2014-11-18 DIAGNOSIS — M549 Dorsalgia, unspecified: Secondary | ICD-10-CM | POA: Insufficient documentation

## 2014-11-18 DIAGNOSIS — E119 Type 2 diabetes mellitus without complications: Secondary | ICD-10-CM | POA: Diagnosis not present

## 2014-11-18 DIAGNOSIS — I10 Essential (primary) hypertension: Secondary | ICD-10-CM | POA: Insufficient documentation

## 2014-11-18 DIAGNOSIS — Z23 Encounter for immunization: Secondary | ICD-10-CM | POA: Diagnosis not present

## 2014-11-18 HISTORY — PX: LIPOSUCTION: SHX10

## 2014-11-18 HISTORY — PX: PANNICULECTOMY: SHX5360

## 2014-11-18 HISTORY — PX: PANNICULECTOMY: SUR1001

## 2014-11-18 LAB — GLUCOSE, CAPILLARY
GLUCOSE-CAPILLARY: 161 mg/dL — AB (ref 70–99)
Glucose-Capillary: 174 mg/dL — ABNORMAL HIGH (ref 70–99)

## 2014-11-18 SURGERY — PANNICULECTOMY
Anesthesia: General | Site: Abdomen

## 2014-11-18 MED ORDER — SODIUM CHLORIDE 0.9 % IJ SOLN
INTRAMUSCULAR | Status: AC
  Filled 2014-11-18: qty 10

## 2014-11-18 MED ORDER — HYDROMORPHONE HCL 1 MG/ML IJ SOLN
INTRAMUSCULAR | Status: AC
  Filled 2014-11-18: qty 1

## 2014-11-18 MED ORDER — LIDOCAINE HCL (CARDIAC) 20 MG/ML IV SOLN
INTRAVENOUS | Status: AC
  Filled 2014-11-18: qty 5

## 2014-11-18 MED ORDER — GLYCOPYRROLATE 0.2 MG/ML IJ SOLN
INTRAMUSCULAR | Status: DC | PRN
  Administered 2014-11-18: .7 mg via INTRAVENOUS

## 2014-11-18 MED ORDER — FENTANYL CITRATE (PF) 250 MCG/5ML IJ SOLN
INTRAMUSCULAR | Status: AC
  Filled 2014-11-18: qty 5

## 2014-11-18 MED ORDER — ARTIFICIAL TEARS OP OINT
TOPICAL_OINTMENT | OPHTHALMIC | Status: AC
  Filled 2014-11-18: qty 3.5

## 2014-11-18 MED ORDER — ONDANSETRON HCL 4 MG/2ML IJ SOLN
INTRAMUSCULAR | Status: AC
  Administered 2014-11-18: 4 mg via INTRAVENOUS
  Filled 2014-11-18: qty 2

## 2014-11-18 MED ORDER — LACTATED RINGERS IV SOLN
INTRAVENOUS | Status: DC | PRN
  Administered 2014-11-18 (×2): via INTRAVENOUS

## 2014-11-18 MED ORDER — HYDROCODONE-ACETAMINOPHEN 5-325 MG PO TABS
1.0000 | ORAL_TABLET | ORAL | Status: DC | PRN
  Administered 2014-11-18 – 2014-11-19 (×3): 2 via ORAL
  Filled 2014-11-18 (×3): qty 2

## 2014-11-18 MED ORDER — SODIUM BICARBONATE 4 % IV SOLN
INTRAVENOUS | Status: AC
  Administered 2014-11-18: 1000 mL via INTRAMUSCULAR
  Filled 2014-11-18: qty 50

## 2014-11-18 MED ORDER — FENTANYL CITRATE (PF) 100 MCG/2ML IJ SOLN
INTRAMUSCULAR | Status: DC | PRN
  Administered 2014-11-18 (×2): 50 ug via INTRAVENOUS
  Administered 2014-11-18: 100 ug via INTRAVENOUS
  Administered 2014-11-18: 50 ug via INTRAVENOUS

## 2014-11-18 MED ORDER — PROPOFOL 10 MG/ML IV BOLUS
INTRAVENOUS | Status: DC | PRN
  Administered 2014-11-18: 30 mg via INTRAVENOUS
  Administered 2014-11-18: 200 mg via INTRAVENOUS

## 2014-11-18 MED ORDER — MORPHINE SULFATE 2 MG/ML IJ SOLN
INTRAMUSCULAR | Status: AC
  Administered 2014-11-18: 2 mg via INTRAVENOUS
  Filled 2014-11-18: qty 1

## 2014-11-18 MED ORDER — LIDOCAINE HCL (CARDIAC) 20 MG/ML IV SOLN
INTRAVENOUS | Status: DC | PRN
  Administered 2014-11-18: 40 mg via INTRAVENOUS

## 2014-11-18 MED ORDER — PROPOFOL 10 MG/ML IV BOLUS
INTRAVENOUS | Status: AC
  Filled 2014-11-18: qty 20

## 2014-11-18 MED ORDER — OXYCODONE HCL 5 MG PO TABS
5.0000 mg | ORAL_TABLET | Freq: Once | ORAL | Status: AC | PRN
  Administered 2014-11-18: 5 mg via ORAL

## 2014-11-18 MED ORDER — GLYCOPYRROLATE 0.2 MG/ML IJ SOLN
INTRAMUSCULAR | Status: AC
  Filled 2014-11-18: qty 3

## 2014-11-18 MED ORDER — MIDAZOLAM HCL 2 MG/2ML IJ SOLN
INTRAMUSCULAR | Status: AC
  Filled 2014-11-18: qty 2

## 2014-11-18 MED ORDER — PNEUMOCOCCAL VAC POLYVALENT 25 MCG/0.5ML IJ INJ
0.5000 mL | INJECTION | INTRAMUSCULAR | Status: AC
  Administered 2014-11-19: 0.5 mL via INTRAMUSCULAR
  Filled 2014-11-18: qty 0.5

## 2014-11-18 MED ORDER — LIDOCAINE-EPINEPHRINE 0.5 %-1:200000 IJ SOLN
INTRAMUSCULAR | Status: AC
  Filled 2014-11-18: qty 2

## 2014-11-18 MED ORDER — DEXTROSE IN LACTATED RINGERS 5 % IV SOLN
INTRAVENOUS | Status: DC
  Administered 2014-11-18 – 2014-11-19 (×3): via INTRAVENOUS

## 2014-11-18 MED ORDER — ONDANSETRON HCL 4 MG/2ML IJ SOLN
4.0000 mg | Freq: Four times a day (QID) | INTRAMUSCULAR | Status: DC | PRN
  Administered 2014-11-18: 4 mg via INTRAVENOUS
  Filled 2014-11-18: qty 2

## 2014-11-18 MED ORDER — ROCURONIUM BROMIDE 100 MG/10ML IV SOLN
INTRAVENOUS | Status: DC | PRN
  Administered 2014-11-18: 50 mg via INTRAVENOUS

## 2014-11-18 MED ORDER — CIPROFLOXACIN IN D5W 400 MG/200ML IV SOLN
400.0000 mg | INTRAVENOUS | Status: AC
  Administered 2014-11-18: 400 mg via INTRAVENOUS
  Filled 2014-11-18: qty 200

## 2014-11-18 MED ORDER — OXYCODONE HCL 5 MG PO TABS
ORAL_TABLET | ORAL | Status: AC
  Filled 2014-11-18: qty 1

## 2014-11-18 MED ORDER — VECURONIUM BROMIDE 10 MG IV SOLR
INTRAVENOUS | Status: AC
  Filled 2014-11-18: qty 10

## 2014-11-18 MED ORDER — CIPROFLOXACIN IN D5W 400 MG/200ML IV SOLN
400.0000 mg | Freq: Two times a day (BID) | INTRAVENOUS | Status: AC
  Administered 2014-11-18: 400 mg via INTRAVENOUS
  Filled 2014-11-18: qty 200

## 2014-11-18 MED ORDER — VECURONIUM BROMIDE 10 MG IV SOLR
INTRAVENOUS | Status: DC | PRN
  Administered 2014-11-18: 2 mg via INTRAVENOUS

## 2014-11-18 MED ORDER — OXYCODONE HCL 5 MG/5ML PO SOLN: 5.0000 mg | Freq: Once | ORAL | Status: AC | PRN

## 2014-11-18 MED ORDER — SUCCINYLCHOLINE CHLORIDE 20 MG/ML IJ SOLN
INTRAMUSCULAR | Status: AC
  Filled 2014-11-18: qty 1

## 2014-11-18 MED ORDER — EPHEDRINE SULFATE 50 MG/ML IJ SOLN
INTRAMUSCULAR | Status: AC
  Filled 2014-11-18: qty 1

## 2014-11-18 MED ORDER — MORPHINE SULFATE 2 MG/ML IJ SOLN
2.0000 mg | INTRAMUSCULAR | Status: DC | PRN
  Administered 2014-11-18 – 2014-11-19 (×4): 2 mg via INTRAVENOUS
  Filled 2014-11-18 (×3): qty 1

## 2014-11-18 MED ORDER — ONDANSETRON HCL 4 MG/2ML IJ SOLN
INTRAMUSCULAR | Status: DC | PRN
  Administered 2014-11-18: 4 mg via INTRAVENOUS

## 2014-11-18 MED ORDER — STERILE WATER FOR INJECTION IJ SOLN
INTRAMUSCULAR | Status: AC
  Filled 2014-11-18: qty 10

## 2014-11-18 MED ORDER — ARTIFICIAL TEARS OP OINT
TOPICAL_OINTMENT | OPHTHALMIC | Status: DC | PRN
  Administered 2014-11-18: 1 via OPHTHALMIC

## 2014-11-18 MED ORDER — NEOSTIGMINE METHYLSULFATE 10 MG/10ML IV SOLN
INTRAVENOUS | Status: DC | PRN
  Administered 2014-11-18: 4 mg via INTRAVENOUS

## 2014-11-18 MED ORDER — PROMETHAZINE HCL 25 MG/ML IJ SOLN
25.0000 mg | Freq: Four times a day (QID) | INTRAMUSCULAR | Status: DC | PRN
  Administered 2014-11-18 – 2014-11-19 (×3): 25 mg via INTRAVENOUS
  Filled 2014-11-18 (×3): qty 1

## 2014-11-18 MED ORDER — ROCURONIUM BROMIDE 50 MG/5ML IV SOLN
INTRAVENOUS | Status: AC
  Filled 2014-11-18: qty 1

## 2014-11-18 MED ORDER — NEOSTIGMINE METHYLSULFATE 10 MG/10ML IV SOLN
INTRAVENOUS | Status: AC
  Filled 2014-11-18: qty 1

## 2014-11-18 MED ORDER — MIDAZOLAM HCL 5 MG/5ML IJ SOLN
INTRAMUSCULAR | Status: DC | PRN
  Administered 2014-11-18: 2 mg via INTRAVENOUS

## 2014-11-18 MED ORDER — 0.9 % SODIUM CHLORIDE (POUR BTL) OPTIME
TOPICAL | Status: DC | PRN
  Administered 2014-11-18: 1000 mL

## 2014-11-18 MED ORDER — ONDANSETRON HCL 4 MG PO TABS
4.0000 mg | ORAL_TABLET | Freq: Four times a day (QID) | ORAL | Status: DC | PRN
  Administered 2014-11-18 – 2014-11-19 (×2): 4 mg via ORAL
  Filled 2014-11-18: qty 1

## 2014-11-18 MED ORDER — HYDROMORPHONE HCL 1 MG/ML IJ SOLN
0.5000 mg | INTRAMUSCULAR | Status: AC | PRN
  Administered 2014-11-18 (×4): 0.5 mg via INTRAVENOUS

## 2014-11-18 SURGICAL SUPPLY — 66 items
ATCH SMKEVC FLXB CAUT HNDSWH (FILTER) ×1 IMPLANT
BAG DECANTER FOR FLEXI CONT (MISCELLANEOUS) ×3 IMPLANT
BENZOIN TINCTURE PRP APPL 2/3 (GAUZE/BANDAGES/DRESSINGS) ×15 IMPLANT
BINDER ABDOMINAL 12 ML 46-62 (SOFTGOODS) ×3 IMPLANT
BLADE KNIFE  20 PERSONNA (BLADE) ×4
BLADE KNIFE 20 PERSONNA (BLADE) ×2 IMPLANT
BLADE KNIFE PERSONA 15 (BLADE) ×3 IMPLANT
CLOSURE STERI-STRIP 1/2X4 (GAUZE/BANDAGES/DRESSINGS) ×1
CLOSURE WOUND 1/2 X4 (GAUZE/BANDAGES/DRESSINGS) ×8
CLSR STERI-STRIP ANTIMIC 1/2X4 (GAUZE/BANDAGES/DRESSINGS) ×2 IMPLANT
CONNECTOR 5 IN 1 STRAIGHT STRL (MISCELLANEOUS) ×3 IMPLANT
COVER SURGICAL LIGHT HANDLE (MISCELLANEOUS) ×3 IMPLANT
DRAIN CHANNEL 10F 3/8 F FF (DRAIN) ×6 IMPLANT
DRAPE LAPAROSCOPIC ABDOMINAL (DRAPES) ×3 IMPLANT
DRSG PAD ABDOMINAL 8X10 ST (GAUZE/BANDAGES/DRESSINGS) ×3 IMPLANT
ELECT CAUTERY BLADE 6.4 (BLADE) ×3 IMPLANT
ELECT REM PT RETURN 9FT ADLT (ELECTROSURGICAL) ×3
ELECTRODE REM PT RTRN 9FT ADLT (ELECTROSURGICAL) ×1 IMPLANT
EVACUATOR 3/16  PVC DRAIN (DRAIN) ×2
EVACUATOR 3/16 PVC DRAIN (DRAIN) ×1 IMPLANT
EVACUATOR PREFILTER SMOKE (MISCELLANEOUS) ×3 IMPLANT
EVACUATOR SILICONE 100CC (DRAIN) ×6 IMPLANT
EVACUATOR SMOKE ACCUVAC VALLEY (FILTER) ×2
GAUZE SPONGE 4X4 12PLY STRL (GAUZE/BANDAGES/DRESSINGS) ×6 IMPLANT
GAUZE XEROFORM 5X9 LF (GAUZE/BANDAGES/DRESSINGS) ×9 IMPLANT
GLOVE BIOGEL PI IND STRL 7.0 (GLOVE) ×1 IMPLANT
GLOVE BIOGEL PI IND STRL 7.5 (GLOVE) ×1 IMPLANT
GLOVE BIOGEL PI INDICATOR 7.0 (GLOVE) ×2
GLOVE BIOGEL PI INDICATOR 7.5 (GLOVE) ×2
GLOVE ECLIPSE 7.0 STRL STRAW (GLOVE) ×6 IMPLANT
GLOVE SURG SS PI 7.0 STRL IVOR (GLOVE) ×12 IMPLANT
GLOVE SURG SS PI 7.5 STRL IVOR (GLOVE) ×6 IMPLANT
GOWN STRL REUS W/ TWL LRG LVL3 (GOWN DISPOSABLE) ×3 IMPLANT
GOWN STRL REUS W/ TWL XL LVL3 (GOWN DISPOSABLE) ×1 IMPLANT
GOWN STRL REUS W/TWL LRG LVL3 (GOWN DISPOSABLE) ×6
GOWN STRL REUS W/TWL XL LVL3 (GOWN DISPOSABLE) ×2
KIT BASIN OR (CUSTOM PROCEDURE TRAY) ×3 IMPLANT
KIT ROOM TURNOVER OR (KITS) ×3 IMPLANT
MARKER SKIN DUAL TIP RULER LAB (MISCELLANEOUS) ×3 IMPLANT
NEEDLE SPNL 18GX3.5 QUINCKE PK (NEEDLE) ×6 IMPLANT
NS IRRIG 1000ML POUR BTL (IV SOLUTION) ×6 IMPLANT
PACK GENERAL/GYN (CUSTOM PROCEDURE TRAY) ×12 IMPLANT
PAD ARMBOARD 7.5X6 YLW CONV (MISCELLANEOUS) ×6 IMPLANT
PIN SAFETY STERILE (MISCELLANEOUS) ×3 IMPLANT
PREFILTER EVAC NS 1 1/3-3/8IN (MISCELLANEOUS) ×3 IMPLANT
PREFILTER SMOKE EVAC (FILTER) ×3 IMPLANT
SPECIMEN JAR LARGE (MISCELLANEOUS) ×3 IMPLANT
SPONGE GAUZE 4X4 12PLY STER LF (GAUZE/BANDAGES/DRESSINGS) ×6 IMPLANT
SPONGE LAP 18X18 X RAY DECT (DISPOSABLE) ×12 IMPLANT
STAPLER VISISTAT 35W (STAPLE) ×3 IMPLANT
STRIP CLOSURE SKIN 1/2X4 (GAUZE/BANDAGES/DRESSINGS) ×16 IMPLANT
SUT ETHILON 2 0 FS 18 (SUTURE) ×3 IMPLANT
SUT ETHILON 3 0 PS 1 (SUTURE) ×6 IMPLANT
SUT MNCRL AB 3-0 PS2 18 (SUTURE) ×6 IMPLANT
SUT MON AB 2-0 CT1 36 (SUTURE) ×6 IMPLANT
SUT MON AB 5-0 PS2 18 (SUTURE) ×6 IMPLANT
SUT PROLENE 1 XLH 60 (SUTURE) IMPLANT
SUT PROLENE 3 0 PS 1 (SUTURE) ×6 IMPLANT
SYR 50ML LL SCALE MARK (SYRINGE) ×6 IMPLANT
SYR 50ML SLIP (SYRINGE) ×6 IMPLANT
TOWEL OR 17X24 6PK STRL BLUE (TOWEL DISPOSABLE) ×6 IMPLANT
TOWEL OR 17X26 10 PK STRL BLUE (TOWEL DISPOSABLE) ×6 IMPLANT
TRAY FOLEY BAG SILVER LF 16FR (CATHETERS) ×3 IMPLANT
TUBE CONNECTING 12'X1/4 (SUCTIONS) ×1
TUBE CONNECTING 12X1/4 (SUCTIONS) ×2 IMPLANT
YANKAUER SUCT BULB TIP NO VENT (SUCTIONS) ×3 IMPLANT

## 2014-11-18 NOTE — Anesthesia Preprocedure Evaluation (Signed)
Anesthesia Evaluation  Patient identified by MRN, date of birth, ID band Patient awake    Reviewed: Allergy & Precautions, NPO status , Patient's Chart, lab work & pertinent test results, Unable to perform ROS - Chart review only  Airway Mallampati: II   Neck ROM: full    Dental   Pulmonary neg pulmonary ROS,  breath sounds clear to auscultation        Cardiovascular hypertension, Rhythm:regular Rate:Normal     Neuro/Psych  Headaches,    GI/Hepatic   Endo/Other  diabetes, Type 2Hypothyroidism Morbid obesity  Renal/GU      Musculoskeletal   Abdominal   Peds  Hematology   Anesthesia Other Findings   Reproductive/Obstetrics                             Anesthesia Physical Anesthesia Plan  ASA: II  Anesthesia Plan: General   Post-op Pain Management:    Induction: Intravenous  Airway Management Planned: Oral ETT  Additional Equipment:   Intra-op Plan:   Post-operative Plan: Extubation in OR  Informed Consent: I have reviewed the patients History and Physical, chart, labs and discussed the procedure including the risks, benefits and alternatives for the proposed anesthesia with the patient or authorized representative who has indicated his/her understanding and acceptance.     Plan Discussed with: CRNA, Anesthesiologist and Surgeon  Anesthesia Plan Comments:         Anesthesia Quick Evaluation

## 2014-11-18 NOTE — Anesthesia Postprocedure Evaluation (Signed)
Anesthesia Post Note  Patient: Angela Cisneros  Procedure(s) Performed: Procedure(s) (LRB): PANNICULECTOMY WITH REPAIR OF DIASTASIS RECTI  (N/A) LIPOSUCTION (N/A)  Anesthesia type: General  Patient location: PACU  Post pain: Pain level controlled and Adequate analgesia  Post assessment: Post-op Vital signs reviewed, Patient's Cardiovascular Status Stable, Respiratory Function Stable, Patent Airway and Pain level controlled  Last Vitals:  Filed Vitals:   11/18/14 1215  BP: 166/98  Pulse: 58  Temp:   Resp: 13    Post vital signs: Reviewed and stable  Level of consciousness: awake, alert  and oriented  Complications: No apparent anesthesia complications

## 2014-11-18 NOTE — Brief Op Note (Signed)
11/18/2014  11:30 AM  PATIENT:  Angela Cisneros  44 y.o. female  PRE-OPERATIVE DIAGNOSIS:  PANNICULITIS   POST-OPERATIVE DIAGNOSIS:  PANNICULITIS   PROCEDURE:  Procedure(s): PANNICULECTOMY WITH REPAIR OF DIASTASIS RECTI  (N/A) LIPOSUCTION (N/A)  SURGEON:  Surgeon(s) and Role:    * Cristine Polio, MD - Primary  PHYSICIAN ASSISTANT:   ASSISTANTS: none   ANESTHESIA:   general  EBL:  Total I/O In: 1300 [I.V.:1300] Out: 1000 [Urine:900; Blood:100]  BLOOD ADMINISTERED:none  DRAINS: (lower pubic area) Hemovact drain(s) in the lower pubic area with  Suction Open   LOCAL MEDICATIONS USED:  LIDOCAINE   SPECIMEN:  Excision  DISPOSITION OF SPECIMEN:  PATHOLOGY  COUNTS:  YES  TOURNIQUET:  * No tourniquets in log *  DICTATION: .Other Dictation: Dictation Number [id number]  PLAN OF CARE: Admit for overnight observation  PATIENT DISPOSITION:  PACU - hemodynamically stable.   Delay start of Pharmacological VTE agent (>24hrs) due to surgical blood loss or risk of bleeding: yes

## 2014-11-18 NOTE — Discharge Instructions (Signed)
Activity (include date of return to work if known) °As tolerated: NO showers °NO driving °No heavy activities ° °Diet:regular No restrictions: ° °Wound Care: Keep dressing clean & dry ° °Do not change dressings °For Abdominoplasties wear abdominal binder °Special Instructions: °Do not raise arms up °Continue to empty, recharge, & record drainage from J-P drains &/or °Hemovacs 2-3 times a day, as needed. °Call Doctor if any unusual problems occur such as pain, excessive °Bleeding, unrelieved Nausea/vomiting, Fever &/or chills °When lying down, keep head elevated on 2-3 pillows or back-rest °For Addominoplasties the Jack-knife position °Follow-up appointment: Please call the office. ° °The patient received discharge instruction from:___________________________________________ ° ° °Patient signature ________________________________________ / Date___________ ° ° ° °Signature of individual providing instructions/ Date________________             °

## 2014-11-18 NOTE — H&P (Signed)
Angela Cisneros is an 44 y.o. female.   Chief Complaint: Increased panniculus with back pain and ventral hernia HPI: Has lost over 150 lbs and has a large panniculus with refractory rashes and back pain  Large ventral hernia  Past Medical History  Diagnosis Date  . Hypertension   . Colitis   . Hypothyroidism   . Headache(784.0)   . Fibroid, uterine   . Diabetes mellitus without complication   . Herpes     Past Surgical History  Procedure Laterality Date  . Abdominal hysterectomy    . Cholecystectomy    . Thyroid surgery    . Knee surgery    . Colonoscopy    . Polypectomy    . Tubal ligation      Family History  Problem Relation Age of Onset  . Anesthesia problems Neg Hx   . Hypotension Neg Hx   . Malignant hyperthermia Neg Hx   . Pseudochol deficiency Neg Hx    Social History:  reports that she has never smoked. She does not have any smokeless tobacco history on file. She reports that she drinks alcohol. She reports that she does not use illicit drugs.  Allergies:  Allergies  Allergen Reactions  . Corn Starch Hives and Itching    Powder in gloves    Medications Prior to Admission  Medication Sig Dispense Refill  . amLODipine (NORVASC) 5 MG tablet Take 5 mg by mouth daily.    Marland Kitchen azithromycin (ZITHROMAX Z-PAK) 250 MG tablet Take 2 pills today, then 1 pill daily until gone (Patient not taking: Reported on 11/12/2014) 6 tablet 0  . chlorpheniramine-HYDROcodone (TUSSIONEX PENNKINETIC ER) 10-8 MG/5ML LQCR Take 5 mLs by mouth every 12 (twelve) hours as needed for cough (and pain). (Patient not taking: Reported on 11/12/2014) 100 mL 0  . fluconazole (DIFLUCAN) 150 MG tablet Take 1 tablet (150 mg total) by mouth once. (Patient not taking: Reported on 11/12/2014) 1 tablet 0  . ibuprofen (ADVIL,MOTRIN) 200 MG tablet Take 200 mg by mouth every 6 (six) hours as needed for mild pain or moderate pain.    Marland Kitchen nystatin-triamcinolone ointment (MYCOLOG) Apply 1 application topically 2  (two) times daily. (Patient not taking: Reported on 11/12/2014) 30 g 0  . predniSONE (STERAPRED UNI-PAK) 10 MG tablet Take by mouth daily. Day 1: take 6 tabs.  Day 2: 5 tabs  Day 3: 4 tabs  Day 4: 3 tabs  Day 5: 2 tabs  Day 6: 1 tab (Patient not taking: Reported on 11/12/2014) 21 tablet 0    No results found for this or any previous visit (from the past 48 hour(s)). No results found.  Review of Systems  Constitutional: Negative.   HENT: Negative.   Eyes: Negative.   Respiratory: Negative.   Cardiovascular: Negative.   Gastrointestinal: Positive for abdominal pain.  Genitourinary: Negative.   Musculoskeletal: Positive for back pain.  Skin: Positive for rash.  Neurological: Negative.   Endo/Heme/Allergies: Negative.   Psychiatric/Behavioral: Negative.     Blood pressure 127/71, pulse 75, temperature 97.9 F (36.6 C), temperature source Oral, resp. rate 20, height 5\' 3"  (1.6 m), weight 95.255 kg (210 lb), SpO2 100 %. 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

## 2014-11-18 NOTE — Progress Notes (Signed)
Pt c/ostomach feels like stomach is pulling dr Towanda Malkin at bedside checked binder and site states all is fine

## 2014-11-18 NOTE — Anesthesia Procedure Notes (Addendum)
Procedure Name: Intubation Date/Time: 11/18/2014 9:23 AM Performed by: Scheryl Darter Pre-anesthesia Checklist: Patient identified, Emergency Drugs available, Suction available, Patient being monitored and Timeout performed Patient Re-evaluated:Patient Re-evaluated prior to inductionOxygen Delivery Method: Circle system utilized Preoxygenation: Pre-oxygenation with 100% oxygen Intubation Type: IV induction Ventilation: Mask ventilation without difficulty Laryngoscope Size: Miller and 2 Grade View: Grade II Tube type: Oral Tube size: 7.5 mm Number of attempts: 1 Airway Equipment and Method: Stylet Placement Confirmation: ETT inserted through vocal cords under direct vision,  positive ETCO2 and breath sounds checked- equal and bilateral Secured at: 23 cm Tube secured with: Tape Dental Injury: Teeth and Oropharynx as per pre-operative assessment

## 2014-11-18 NOTE — Transfer of Care (Signed)
Immediate Anesthesia Transfer of Care Note  Patient: Angela Cisneros  Procedure(s) Performed: Procedure(s): PANNICULECTOMY WITH REPAIR OF DIASTASIS RECTI  (N/A) LIPOSUCTION (N/A)  Patient Location: PACU  Anesthesia Type:General  Level of Consciousness: awake, alert , oriented and sedated  Airway & Oxygen Therapy: Patient Spontanous Breathing and Patient connected to nasal cannula oxygen  Post-op Assessment: Report given to RN, Post -op Vital signs reviewed and stable and Patient moving all extremities  Post vital signs: Reviewed and stable  Last Vitals:  Filed Vitals:   11/18/14 0650  BP: 127/71  Pulse: 75  Temp: 36.6 C  Resp: 20    Complications: No apparent anesthesia complications

## 2014-11-19 DIAGNOSIS — M793 Panniculitis, unspecified: Secondary | ICD-10-CM | POA: Diagnosis not present

## 2014-11-19 LAB — CBC
HEMATOCRIT: 36.9 % (ref 36.0–46.0)
Hemoglobin: 12.3 g/dL (ref 12.0–15.0)
MCH: 29.8 pg (ref 26.0–34.0)
MCHC: 33.3 g/dL (ref 30.0–36.0)
MCV: 89.3 fL (ref 78.0–100.0)
Platelets: 209 10*3/uL (ref 150–400)
RBC: 4.13 MIL/uL (ref 3.87–5.11)
RDW: 13.3 % (ref 11.5–15.5)
WBC: 7.2 10*3/uL (ref 4.0–10.5)

## 2014-11-19 NOTE — Progress Notes (Signed)
Discharge instructions reviewed with the patient. Taught the patient how to empty and charge the hemovac. Pt ready for discharge

## 2014-11-19 NOTE — Progress Notes (Signed)
UR completed 

## 2014-11-19 NOTE — Op Note (Signed)
NAMEMERLYN, WIKER           ACCOUNT NO.:  [id number]  MEDICAL RECORD NO.:  [id number]  LOCATION:  [id number]                        FACILITY:  [facility]  PHYSICIAN:  Earvin Hansen L. Dezmond Downie, M.D.DATE OF BIRTH:  07-May-1971  DATE OF PROCEDURE:  11/18/2014 DATE OF DISCHARGE:                              OPERATIVE REPORT   INDICATIONS:  A 44 year old lady, who __________ morbidly obese, lost approximately 160 pounds naturally who has severe panniculus causing increased irritations, rashes, intertriginous changes with __________, ulcerations, and drainage, __________ her to use multiple medications including antifungal medications.  She also has back pain secondary to pull of the panniculus.  She has diastasis recti 4+, and she has a right periumbilical herniation measured approximately 4 x 8 cm.  PROCEDURES PLANNED:  Panniculectomy with repair of severe diastasis recti, repair of ventral hernia, liposuction __________ for contour improvement.  ANESTHESIA:  General.  DESCRIPTION OF PROCEDURE:  Preoperatively, the patient was stood up and drawn for the midline of the abdomen for the W-plasty incision area for her panniculus.  She underwent general anesthesia, intubated orally. Prep was done to the chest, breast, abdominal areas, and groin with Hibiclens soap and solution, walled off with sterile towels and drapes, so as to make a sterile field.  Hibiclens was injected throughout the whole area of surgery a 1000 mL, this was allowed to sit up.  Then, the W-plasty incision opened with the Bovie on cutting.  Hemostasis maintained with the Bovie coagulation across the vessels, and then the dissection was carried down to the areolar tissue overlying the fascia. The dissection was carried up to the umbilicus.  The umbilicus was released on its own pedicle.  Then, dissection was carried all the way to the xiphoid process and right and left upper quadrants.  Hemostasis again maintained with the  Bovie on inspection throughout the perforators around the periumbilical region.  Next, the liposuction was used to fashion out laterally and superiorly to improve contour.  Diastasis recti was examined, was 4+, severe.  It was repaired with a running #1 Prolene suture from the xiphoid process to the umbilicus and from the umbilicus to the suprapubic area with good results.  A few stitches were placed in the internal and external obliques as well.  Next, the patient was placed in jack-knife position.  Large amounts of panniculus was excised and taken off the table with approximately 12 pounds.  After proper hemostasis, the flaps were laid in place and secured with 2-0 Monocryl suture x2 layers and then running subcuticular stitches of 3-0 Monocryl throughout the area for the skin closure.  Another opening was made for the umbilicus, was brought back through and secured with 3-0 Monocryl subcutaneously and multiple sutures of 3-0 nylon.  A large Hemovac drain was placed in the pubic area and brought out through and secured with 2-0 Prolene and threaded around __________ and portions of the dissection for suction drainage.  After this, the wounds were cleansed, covered with sterile dressings, 4x4s, ABDs, Hypafix tape, and Steri-Strips, and abdominal support garment.  She was then taken to the recovery in excellent condition.  Estimated blood loss less than 150 mL. Complications absolutely none.  She was then taken to recovery in  excellent condition.     Yaakov Guthrie. Shon Hough, M.D.     Cathie Hoops  D:  11/18/2014  T:  11/18/2014  Job:  [id number]

## 2014-11-20 ENCOUNTER — Encounter (HOSPITAL_COMMUNITY): Payer: Self-pay | Admitting: Specialist

## 2014-11-27 ENCOUNTER — Emergency Department (HOSPITAL_COMMUNITY): Payer: BLUE CROSS/BLUE SHIELD

## 2014-11-27 ENCOUNTER — Emergency Department (HOSPITAL_COMMUNITY)
Admission: EM | Admit: 2014-11-27 | Discharge: 2014-11-28 | Disposition: A | Discharge status: Home / Self Care | Payer: BLUE CROSS/BLUE SHIELD | Attending: Emergency Medicine | Admitting: Emergency Medicine

## 2014-11-27 ENCOUNTER — Encounter (HOSPITAL_COMMUNITY): Payer: Self-pay | Admitting: Emergency Medicine

## 2014-11-27 DIAGNOSIS — I10 Essential (primary) hypertension: Secondary | ICD-10-CM | POA: Diagnosis not present

## 2014-11-27 DIAGNOSIS — Z8619 Personal history of other infectious and parasitic diseases: Secondary | ICD-10-CM | POA: Diagnosis not present

## 2014-11-27 DIAGNOSIS — Z9049 Acquired absence of other specified parts of digestive tract: Secondary | ICD-10-CM | POA: Diagnosis not present

## 2014-11-27 DIAGNOSIS — E119 Type 2 diabetes mellitus without complications: Secondary | ICD-10-CM | POA: Insufficient documentation

## 2014-11-27 DIAGNOSIS — Z87448 Personal history of other diseases of urinary system: Secondary | ICD-10-CM | POA: Diagnosis not present

## 2014-11-27 DIAGNOSIS — K5909 Other constipation: Secondary | ICD-10-CM | POA: Insufficient documentation

## 2014-11-27 DIAGNOSIS — Z90710 Acquired absence of both cervix and uterus: Secondary | ICD-10-CM | POA: Insufficient documentation

## 2014-11-27 DIAGNOSIS — Z79899 Other long term (current) drug therapy: Secondary | ICD-10-CM | POA: Diagnosis not present

## 2014-11-27 DIAGNOSIS — K5900 Constipation, unspecified: Secondary | ICD-10-CM | POA: Diagnosis present

## 2014-11-27 DIAGNOSIS — Z9851 Tubal ligation status: Secondary | ICD-10-CM | POA: Insufficient documentation

## 2014-11-27 NOTE — ED Provider Notes (Signed)
CSN: [id number]     Arrival date & time 11/27/14  2111 History   First MD Initiated Contact with Patient 11/27/14 2340     No chief complaint on file.    (Consider location/radiation/quality/duration/timing/severity/associated sxs/prior Treatment) The history is provided by the patient and medical records. No language interpreter was used.     Angela Cisneros is a 44 y.o. female  with a hx of HTN, colitis, headache, NIDDM presents to the Emergency Department complaining of gradual, persistent, progressively worsening constipation onset 11/17/14 (her  Last BM).  Pt reports she had hernia removal and abd muscle repair and flap removal by Dr. Georgia Lopes on April 18th.  Pt reports she has been taking the hydrocodone for pain since the surgery.  Pt reports taking Philips stool softener yesterday and today without relief. Associated symptoms include anorexia.  Pt had a follow-up yesterday and he left the drains in place.  She is to follow up on May 4th.  Pt reports drains were putting out 19-25cc each day.  She reports that a small portion of her drain has come loose and she can now see the ports on the drain, but it has not fallen out completely.  Pt reports bloating and cramping but no true abdominal pain.  She reports also trying an enema without relief.  Nothing makes it better and nothing makes it worse.  Pt denies fever, chills, headache, neck pain, chest pain, SOB, N/V/D, weakness, dizziness, syncope.       Past Medical History  Diagnosis Date  . Hypertension   . Colitis   . Hypothyroidism   . Headache(784.0)   . Fibroid, uterine   . Diabetes mellitus without complication   . Herpes    Past Surgical History  Procedure Laterality Date  . Abdominal hysterectomy    . Cholecystectomy    . Thyroid surgery    . Knee surgery    . Colonoscopy    . Polypectomy    . Tubal ligation    . Panniculectomy  11/18/2014    with repair / diastasis recti  . Panniculectomy N/A 11/18/2014   Procedure: PANNICULECTOMY WITH REPAIR OF DIASTASIS RECTI ;  Surgeon: Cristine Polio, MD;  Location: [facility];  Service: Plastics;  Laterality: N/A;  . Liposuction N/A 11/18/2014    Procedure: LIPOSUCTION;  Surgeon: Cristine Polio, MD;  Location: [facility];  Service: Plastics;  Laterality: N/A;   Family History  Problem Relation Age of Onset  . Anesthesia problems Neg Hx   . Hypotension Neg Hx   . Malignant hyperthermia Neg Hx   . Pseudochol deficiency Neg Hx   . Hypertension Other   . Diabetes Other    History  Substance Use Topics  . Smoking status: Never Smoker   . Smokeless tobacco: Never Used  . Alcohol Use: Yes     Comment: special occasions   OB History    Gravida Para Term Preterm AB TAB SAB Ectopic Multiple Living   6 6 5 1      6      Review of Systems  Constitutional: Negative for fever, diaphoresis, appetite change, fatigue and unexpected weight change.  HENT: Negative for mouth sores and trouble swallowing.   Respiratory: Negative for cough, chest tightness, shortness of breath, wheezing and stridor.   Cardiovascular: Negative for chest pain and palpitations.  Gastrointestinal: Positive for abdominal pain (mild cramping) and constipation. Negative for nausea, vomiting, diarrhea, blood in stool, abdominal distention and rectal pain.  Genitourinary: Negative for dysuria, urgency,  frequency, hematuria, flank pain and difficulty urinating.  Musculoskeletal: Negative for back pain, neck pain and neck stiffness.  Skin: Negative for rash.  Neurological: Negative for weakness.  Hematological: Negative for adenopathy.  Psychiatric/Behavioral: Negative for confusion.  All other systems reviewed and are negative.     Allergies  Corn starch  Home Medications   Prior to Admission medications   Medication Sig Start Date End Date Taking? Authorizing Provider  bisacodyl (DULCOLAX) 5 MG EC tablet Take 15 mg by mouth daily as needed for mild constipation or moderate  constipation.   Yes Historical Provider, MD  ciprofloxacin (CIPRO) 500 MG tablet Take 500 mg by mouth every 12 (twelve) hours. For 5 days   Yes Historical Provider, MD  HYDROcodone-acetaminophen (NORCO) 10-325 MG per tablet Take 1 tablet by mouth every 4 (four) hours as needed for moderate pain.   Yes Historical Provider, MD  promethazine (PHENERGAN) 25 MG tablet Take 25 mg by mouth every 4 (four) hours as needed for nausea or vomiting.   Yes Historical Provider, MD  Rubber Goods (ENEMA BOTTLE) MISC 1 Bottle by Does not apply route once as needed (constipation).   Yes Historical Provider, MD  amLODipine (NORVASC) 5 MG tablet Take 5 mg by mouth daily.    Historical Provider, MD  lubiprostone (AMITIZA) 24 MCG capsule Take 1 capsule (24 mcg total) by mouth 2 (two) times daily with a meal. 11/28/14   Nahome Bublitz, PA-C   BP 148/101 mmHg  Pulse 80  Temp(Src) 98 F (36.7 C) (Oral)  Resp 18  SpO2 97% Physical Exam  Constitutional: She appears well-developed and well-nourished. No distress.  Awake, alert, nontoxic appearance  HENT:  Head: Normocephalic and atraumatic.  Mouth/Throat: Oropharynx is clear and moist. No oropharyngeal exudate.  Eyes: Conjunctivae are normal. No scleral icterus.  Neck: Normal range of motion. Neck supple.  Cardiovascular: Normal rate, regular rhythm, normal heart sounds and intact distal pulses.   No murmur heard. Pulmonary/Chest: Effort normal and breath sounds normal. No respiratory distress. She has no wheezes.  Equal chest expansion  Abdominal: Soft. Bowel sounds are normal. She exhibits no distension and no mass. There is no tenderness. There is no rebound and no guarding.  abd is soft and nontender Well healing umbilical incision without erythema or drainage Well healing large lower abd incision with sutures in place and without erythema or induration Wound drain in place but appears to have pulled out with the drainage holes visible.  No TTP of the site;  clean without purulent drainage  Musculoskeletal: Normal range of motion. She exhibits no edema.  Neurological: She is alert.  Speech is clear and goal oriented Moves extremities without ataxia  Skin: Skin is warm and dry. She is not diaphoretic.  Psychiatric: She has a normal mood and affect.  Nursing note and vitals reviewed.   ED Course  Procedures (including critical care time) Labs Review Labs Reviewed - No data to display  Imaging Review Dg Abd Acute W/chest  11/27/2014   CLINICAL DATA:  Acute onset of generalized abdominal pain and constipation. Initial encounter.  EXAM: DG ABDOMEN ACUTE W/ 1V CHEST  COMPARISON:  Chest radiograph performed 09/04/2014, and CT of the abdomen and pelvis from 02/01/2013  FINDINGS: The lungs are well-aerated and clear. There is no evidence of focal opacification, pleural effusion or pneumothorax. The cardiomediastinal silhouette is within normal limits.  The visualized bowel gas pattern is unremarkable. Scattered stool and air are seen within the colon; there is no evidence  of small bowel dilatation to suggest obstruction. No free intra-abdominal air is identified on the provided upright view. Clips are noted within the right upper quadrant, reflecting prior cholecystectomy.  No acute osseous abnormalities are seen; the sacroiliac joints are unremarkable in appearance. A drainage catheter is noted overlying the left hemipelvis.  IMPRESSION: 1. Unremarkable bowel gas pattern; no free intra-abdominal air seen. Moderate amount of stool noted in the colon. 2. No acute cardiopulmonary process seen.   Electronically Signed   By: Garald Balding M.D.   On: 11/27/2014 23:26     EKG Interpretation None      MDM   Final diagnoses:  Other constipation   Angela Cisneros presents with history and physical consistent with narcotic induced constipation. Acute abdominal series without evidence of obstruction or perforation.  Patient abdomen is soft and without  tenderness but she reports a mild cramping sensation.  Patient has not been proactive with stool softeners during her narcotic usage likely causing her severe constipation. We will begin lubiprostone.  Patient's wound drain has come loose and is slightly out of place however there is no evidence of infection. Recommend that she discuss this with her surgeon tomorrow via phone for further evaluation and treatment.  Patient is well-appearing and tolerating by mouth here in the emergency department.  She is afebrile and not tachycardic. She does not have a surgical abdomen.  BP 148/101 mmHg  Pulse 80  Temp(Src) 98 F (36.7 C) (Oral)  Resp 18  SpO2 97%    Abigail Butts, PA-C 11/28/14 0047  Julianne Rice, MD 11/28/14 [phone]

## 2014-11-27 NOTE — ED Notes (Signed)
Pt states she has not had a BM since last Sunday  Pt states she took two phillips tabs last night without results  Pt states she used an enema this evening and passed on piece of hard stool  Pt states she took 3 more phillips tabs tonight  Pt states she had a hernia repair with flap removed  Pt has a drain she states is not functioning correctly  Pt is c/o nausea

## 2014-11-27 NOTE — ED Notes (Signed)
Per EMS pt had abd surgery to repair hernia 2 weeks prior on Monday 18th. Since then she's been constipated since Sunday, pt states she noticed the wound vac wasn't draining today causing her mild discomfort.

## 2014-11-28 MED ORDER — LUBIPROSTONE 24 MCG PO CAPS: 24.0000 ug | ORAL_CAPSULE | Freq: Two times a day (BID) | ORAL | Status: DC

## 2014-11-28 NOTE — Discharge Instructions (Signed)
1. Medications: Amitiza, usual home medications 2. Treatment: rest, drink plenty of fluids,  3. Follow Up: Please call your surgeon TOMORROW morning to discuss your constipation and drain placement.  Please return to the ER for for fevers, abdominal pain or other concerns   Constipation Constipation is when a person has fewer than three bowel movements a week, has difficulty having a bowel movement, or has stools that are dry, hard, or larger than normal. As people grow older, constipation is more common. If you try to fix constipation with medicines that make you have a bowel movement (laxatives), the problem may get worse. Long-term laxative use may cause the muscles of the colon to become weak. A low-fiber diet, not taking in enough fluids, and taking certain medicines may make constipation worse.  CAUSES   Certain medicines, such as antidepressants, pain medicine, iron supplements, antacids, and water pills.   Certain diseases, such as diabetes, irritable bowel syndrome (IBS), thyroid disease, or depression.   Not drinking enough water.   Not eating enough fiber-rich foods.   Stress or travel.   Lack of physical activity or exercise.   Ignoring the urge to have a bowel movement.   Using laxatives too much.  SIGNS AND SYMPTOMS   Having fewer than three bowel movements a week.   Straining to have a bowel movement.   Having stools that are hard, dry, or larger than normal.   Feeling full or bloated.   Pain in the lower abdomen.   Not feeling relief after having a bowel movement.  DIAGNOSIS  Your health care provider will take a medical history and perform a physical exam. Further testing may be done for severe constipation. Some tests may include:  A barium enema X-ray to examine your rectum, colon, and, sometimes, your small intestine.   A sigmoidoscopy to examine your lower colon.   A colonoscopy to examine your entire colon. TREATMENT  Treatment will  depend on the severity of your constipation and what is causing it. Some dietary treatments include drinking more fluids and eating more fiber-rich foods. Lifestyle treatments may include regular exercise. If these diet and lifestyle recommendations do not help, your health care provider may recommend taking over-the-counter laxative medicines to help you have bowel movements. Prescription medicines may be prescribed if over-the-counter medicines do not work.  HOME CARE INSTRUCTIONS   Eat foods that have a lot of fiber, such as fruits, vegetables, whole grains, and beans.  Limit foods high in fat and processed sugars, such as french fries, hamburgers, cookies, candies, and soda.   A fiber supplement may be added to your diet if you cannot get enough fiber from foods.   Drink enough fluids to keep your urine clear or pale yellow.   Exercise regularly or as directed by your health care provider.   Go to the restroom when you have the urge to go. Do not hold it.   Only take over-the-counter or prescription medicines as directed by your health care provider. Do not take other medicines for constipation without talking to your health care provider first.  [place] IF:   You have bright red blood in your stool.   Your constipation lasts for more than 4 days or gets worse.   You have abdominal or rectal pain.   You have thin, pencil-like stools.   You have unexplained weight loss. MAKE SURE YOU:   Understand these instructions.  Will watch your condition.  Will get help right away  if you are not doing well or get worse. Document Released: 04/16/2004 Document Revised: 07/24/2013 Document Reviewed: 04/30/2013 [facility] Patient Information 2015 [place], [place]. This information is not intended to replace advice given to you by your health care provider. Make sure you discuss any questions you have with your health care provider.

## 2015-03-17 ENCOUNTER — Other Ambulatory Visit: Payer: Self-pay

## 2015-03-19 LAB — CYTOLOGY - PAP

## 2015-03-24 NOTE — Discharge Summary (Signed)
i DICTATED ON THE SAME SYSTEM AS MY OP REPORTS

## 2015-10-31 ENCOUNTER — Encounter: Payer: Self-pay | Admitting: Allergy and Immunology

## 2015-10-31 ENCOUNTER — Ambulatory Visit (INDEPENDENT_AMBULATORY_CARE_PROVIDER_SITE_OTHER): Payer: BLUE CROSS/BLUE SHIELD | Admitting: Allergy and Immunology

## 2015-10-31 VITALS — BP 120/90 | HR 80 | Temp 98.4°F | Resp 17 | Ht 64.17 in | Wt 209.9 lb

## 2015-10-31 DIAGNOSIS — J310 Chronic rhinitis: Secondary | ICD-10-CM

## 2015-10-31 DIAGNOSIS — R21 Rash and other nonspecific skin eruption: Secondary | ICD-10-CM | POA: Diagnosis not present

## 2015-10-31 NOTE — Patient Instructions (Signed)
Take Home Sheet  1. Avoidance: Mite and Mold and fragranced soaps, lotions, detergents.   2. Antihistamine: Zyrtec 10mg   by mouth once daily for runny nose or itching as needed.   3. Nasal Spray: Saline 2 spray(s) each nostril once daily for stuffy nose or drainage.    4.  If new episodes of skin changes/rash then take picture and document environment, exposure, ingestion, and activity.   5. Follow up Visit: 2 months or sooner if needed.   Websites that have reliable Patient information: 1. American Academy of Asthma, Allergy, & Immunology: www.aaaai.org 2. Food Allergy [employer]: www.foodallergy.org 3. Mothers of Asthmatics: www.aanma.org 4. [place]: DiningCalendar.de 5. American College of Allergy, Asthma, & Immunology: https://robertson.info/ or www.acaai.org

## 2015-10-31 NOTE — Progress Notes (Signed)
NEW PATIENT NOTE  RE: [name] MRN: [id number] DOB: 06-05-1971 [facility] [address] [place] [address] Date of Office Visit: 10/31/2015  Dear Angela Lesch, MD:  I had the pleasure of seeing Angela Cisneros  today in initial evaluation as you recall-- Subjective:  Angela Cisneros is a 45 y.o. female who presents today for Allergy Testing and Rash  Assessment:   1. Rash, post inflammatory hyperpigmentation at back and no acute rash today.  2. Mixed rhinitis.   3.      Negative wheat, rye and barley testing today. 4.      Complex medical history with upcoming Rheumatology evaluation. Plan:   Patient Instructions  1. Avoidance: Mite and Mold and fragranced soaps, lotions, detergents. 2. Antihistamine: Zyrtec 10mg   by mouth once daily for runny nose or itching as needed. 3. Nasal Spray: Saline 2 spray(s) each nostril once daily for stuffy nose or drainage.  4.  If new episodes of skin changes/rash then take picture and document environment, exposure, ingestion, and activity. 5. Follow up Visit: 2 months or sooner if needed.  HPI: Angela Cisneros presents to the office with history over the last 5 years of spring season rhinorrhea, congestion, sneezing, itchy watery eyes with occasional post nasal drip.  She describes greater difficulty with pollen exposures, but wonders about additional sensitivities given recent rash concerns.  She reports intermittent skin changes over the last 5 years but more intense at her back over the last 2 months.  There is minimal itching and she would call the skin changes as  "Whiteheads", though now improved.  She denies hives, swelling, eczema, sensitive skin or any acute reactions.  Typically uses Dial soap, Olay quench soap and Gain detergent, which she did not appreciated contributed to her skin difficulty.  She minimizes dairy but often eats cheese and was told she had "gluten allergy" 8 years ago after GI  work-up with labs and therefore avoids along with narrowed diverticulitis diet.  She denies cough, wheeze, shortness of breath or difficulty in breathing. Denies ED or Urgent care visits or frequent antibiotic courses.   She reports recent evaluation with Cardiology included a normal chest xray and reports +ANA and therefore Rheumatology visit upcoming.    Medical History: Past Medical History  Diagnosis Date  . Hypertension   . Colitis   . Hypothyroidism   . Headache(784.0)   . Fibroid, uterine   . Diabetes mellitus without complication (HCC)   . Herpes    Surgical History: Past Surgical History  Procedure Laterality Date  . Abdominal hysterectomy    . Cholecystectomy    . Thyroid surgery    . Knee surgery    . Colonoscopy    . Polypectomy    . Tubal ligation    . Panniculectomy  11/18/2014    with repair / diastasis recti  . Panniculectomy N/A 11/18/2014    Procedure: PANNICULECTOMY WITH REPAIR OF DIASTASIS RECTI ;  Surgeon: Louisa Second, MD;  Location: [facility];  Service: Plastics;  Laterality: N/A;  . Liposuction N/A 11/18/2014    Procedure: LIPOSUCTION;  Surgeon: Louisa Second, MD;  Location: [facility];  Service: Plastics;  Laterality: N/A;   Family History: Family History  Problem Relation Age of Onset  . Anesthesia problems Neg Hx   . Hypotension Neg Hx   . Malignant hyperthermia Neg Hx   . Pseudochol deficiency Neg Hx   . Angioedema Neg Hx   . Atopy Neg Hx   .  Immunodeficiency Neg Hx   . Urticaria Neg Hx   . Hypertension Other   . Diabetes Other   . Hypertension Mother   . Diabetes Mother   . Allergic rhinitis Son   . Asthma Son   . Eczema Son   . Allergic rhinitis Daughter   . Asthma Daughter   . Eczema Daughter    Social History: Social History  . Marital Status: widow    Spouse Name: N/A  . Number of Children: 6  . Years of Education: N/A   Social History Main Topics  . Smoking status: Never Smoker   . Smokeless tobacco: Never Used  . Alcohol  Use: Yes     Comment: special occasions  . Drug Use: No  . 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  Donis has a current medication list which includes the following prescription(s): acyclovir ointment, amlodipine, hydrocodone-ibuprofen, lubiprostone, valacyclovir.   Drug Allergies: Allergies  Allergen Reactions  . Corn Starch Hives and Itching    Powder in gloves   Environmental History: [place]line lives in a 43 year old house for 2 years with wood floors, with central heat and air; stuffed mattress, non-feather pillow/comforter without humidifier, pets and smokers.   Review of Systems  Constitutional: Negative for fever, weight loss and malaise/fatigue.  HENT: Positive for congestion. Negative for ear pain, hearing loss, nosebleeds and sore throat.   Eyes: Negative for blurred vision, double vision, pain, discharge and redness.       Reading glasses.  Respiratory: Negative for shortness of breath.        Bronchitis last year, denies history of pneumonia.  Negative PPD.  Gastrointestinal: Negative for heartburn, nausea, vomiting and abdominal pain.  Genitourinary: Negative.   Musculoskeletal: Positive for myalgias. Negative for joint pain.  Skin: Positive for rash. Negative for itching.  Neurological: Negative.  Negative for dizziness, seizures, weakness and headaches.  Endo/Heme/Allergies: Positive for environmental allergies.       Denies sensitivity to aspirin, NSAIDs, stinging insects, latex, jewelry and  Avoids cosmetic foundation due to itching.  Immunological: No chronic or recurring infections. Objective:   Filed Vitals:   10/31/15 1219  BP: 120/90  Pulse: 80  Temp: 98.4 F (36.9 C)  Resp: 17   Physical Exam  Constitutional: She is well-developed, well-nourished, and in no distress.  HENT:  Head: Atraumatic.  Right Ear: Tympanic membrane and ear canal normal.    Left Ear: Tympanic membrane and ear canal normal.  Nose: Mucosal edema present. No rhinorrhea. No epistaxis.  Mouth/Throat: Oropharynx is clear and moist and mucous membranes are normal. No oropharyngeal exudate, posterior oropharyngeal edema or posterior oropharyngeal erythema.  Eyes: Conjunctivae are normal.  Neck: Neck supple.  Cardiovascular: Normal rate, S1 normal and S2 normal.   No murmur heard. Pulmonary/Chest: Effort normal. She has no wheezes. She has no rhonchi. She has no rales.  Abdominal: Soft. Normal appearance and bowel sounds are normal.  Musculoskeletal: She exhibits no edema.  Lymphadenopathy:    She has no cervical adenopathy.  Neurological: She is alert.  Skin: Skin is warm and intact. No rash noted. No cyanosis. Nails show no clubbing.  Tiny macular hyperpigmented areas across back, no hives or eczematous lesions.   Diagnostics:  Spirometry:  FVC  2.11--75%, FEV 1.76--74%. Skin testing:  Mild reactivity to dust mite and minimal reactivity to selected mold species. Negative to selected foods  including wheat, barley and rye.    Claus Silvestro M. Willa Rough, MD   cc: Angela Lesch, MD

## 2015-11-01 ENCOUNTER — Encounter: Payer: Self-pay | Admitting: Allergy and Immunology

## 2016-06-16 ENCOUNTER — Encounter: Payer: Self-pay | Admitting: Gastroenterology

## 2016-06-17 DIAGNOSIS — M791 Myalgia, unspecified site: Secondary | ICD-10-CM | POA: Insufficient documentation

## 2016-06-17 DIAGNOSIS — M170 Bilateral primary osteoarthritis of knee: Secondary | ICD-10-CM | POA: Insufficient documentation

## 2016-06-17 NOTE — Progress Notes (Deleted)
Office Visit Note  Patient: Angela Cisneros             Date of Birth: April 19, 1971           MRN: [id number]             PCP: Harvie Junior, MD Referring: Harvie Junior, MD Visit Date: 06/23/2016 Occupation: @GUAROCC @    Subjective:  No chief complaint on file.   History of Present Illness: Angela Cisneros is a 45 y.o. female ***   Activities of Daily Living:  Patient reports morning stiffness for *** {minute/hour:19697}.   Patient {ACTIONS;DENIES/REPORTS:21021675::"Denies"} nocturnal pain.  Difficulty dressing/grooming: {ACTIONS;DENIES/REPORTS:21021675::"Denies"} Difficulty climbing stairs: {ACTIONS;DENIES/REPORTS:21021675::"Denies"} Difficulty getting out of chair: {ACTIONS;DENIES/REPORTS:21021675::"Denies"} Difficulty using hands for taps, buttons, cutlery, and/or writing: {ACTIONS;DENIES/REPORTS:21021675::"Denies"}   No Rheumatology ROS completed.   PMFS History:  Patient Active Problem List   Diagnosis Date Noted  . S/P panniculectomy 11/18/2014  . COLITIS, ACUTE 01/08/2009  . NAUSEA WITH VOMITING 01/08/2009  . TROCHANTERIC BURSITIS, RIGHT 12/12/2008  . ALLERGIC REACTION 10/11/2008    Past Medical History:  Diagnosis Date  . Colitis   . Diabetes mellitus without complication ([place])   . Fibroid, uterine   . Headache(784.0)   . Herpes   . Hypertension   . Hypothyroidism     Family History  Problem Relation Age of Onset  . Anesthesia problems Neg Hx   . Hypotension Neg Hx   . Malignant hyperthermia Neg Hx   . Pseudochol deficiency Neg Hx   . Angioedema Neg Hx   . Atopy Neg Hx   . Immunodeficiency Neg Hx   . Urticaria Neg Hx   . Hypertension Other   . Diabetes Other   . Hypertension Mother   . Diabetes Mother   . Allergic rhinitis Son   . Asthma Son   . Eczema Son   . Allergic rhinitis Daughter   . Asthma Daughter   . Eczema Daughter    Past Surgical History:  Procedure Laterality Date  . ABDOMINAL HYSTERECTOMY    .  CHOLECYSTECTOMY    . COLONOSCOPY    . KNEE SURGERY    . LIPOSUCTION N/A 11/18/2014   Procedure: LIPOSUCTION;  Surgeon: Cristine Polio, MD;  Location: [facility];  Service: Plastics;  Laterality: N/A;  . PANNICULECTOMY  11/18/2014   with repair / diastasis recti  . PANNICULECTOMY N/A 11/18/2014   Procedure: PANNICULECTOMY WITH REPAIR OF DIASTASIS RECTI ;  Surgeon: Cristine Polio, MD;  Location: [facility];  Service: Plastics;  Laterality: N/A;  . POLYPECTOMY    . THYROID SURGERY    . TUBAL LIGATION     Social History   Social History Narrative  . No narrative on file     Objective: Vital Signs: There were no vitals taken for this visit.   Physical Exam   Musculoskeletal Exam: ***  CDAI Exam: No CDAI exam completed.    Investigation: Findings:  12/11/15 CBC CMP normal May 2017:  CBC was normal.  Comprehensive metabolic panel showed glucose of 285, sed rate 7.  UA showed 3+ glucose.  ANA was negative.  C3, C4 were normal.  RNP was 4.1.  Beta-2 anticardiolipin and lupus anticoagulant were negative.  Hepatitis panel and G6PD were normal.     Imaging: No results found.  Speciality Comments: No specialty comments available.    Procedures:  No procedures performed Allergies: Corn starch   Assessment / Plan: Visit Diagnoses: No diagnosis found.    Orders: No orders of  the defined types were placed in this encounter.  No orders of the defined types were placed in this encounter.   Face-to-face time spent with patient was *** minutes. 50% of time was spent in counseling and coordination of care.  Follow-Up Instructions: No Follow-up on file.   Amy Littrell, RT

## 2016-06-22 ENCOUNTER — Ambulatory Visit (INDEPENDENT_AMBULATORY_CARE_PROVIDER_SITE_OTHER): Payer: BLUE CROSS/BLUE SHIELD | Admitting: Gastroenterology

## 2016-06-22 ENCOUNTER — Encounter: Payer: Self-pay | Admitting: Gastroenterology

## 2016-06-22 VITALS — BP 132/90 | HR 60 | Ht 63.0 in | Wt 191.8 lb

## 2016-06-22 DIAGNOSIS — R197 Diarrhea, unspecified: Secondary | ICD-10-CM | POA: Diagnosis not present

## 2016-06-22 DIAGNOSIS — R140 Abdominal distension (gaseous): Secondary | ICD-10-CM

## 2016-06-22 MED ORDER — CHOLESTYRAMINE LIGHT 4 G PO PACK: 4.0000 g | PACK | Freq: Two times a day (BID) | ORAL | 2 refills | Status: DC

## 2016-06-22 NOTE — Progress Notes (Signed)
06/22/2016 Angela Cisneros [id number] 1970-11-27  CC:  Diarrhea, fecal incontence  HISTORY OF PRESENT ILLNESS:  This is a pleasant 45 year old female who is new to our practice and is here today for a second opinion of her GI symptoms at the request of her PCP, Dr. Jimmye Norman.  Complains of loose stools/diarrhea and some incontinence.  She says that when she lifts something and increases her intra-abdomianl pressure that she sometimes passes some stool.  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  Also complains of bloating and gas.  Denies blood in her stools.  She had been seeing Dr. Collene Mares for several years and we have records from them.  It appears that she has been seen for the same symptoms dating back to at least 2014.    She had a colonoscopy by Dr. Collene Mares in November 2010 at which time she was found to have multiple colonic polyps removed, some tubular adenomas and some hyperplastic polyps.  She also had an isolated diverticulum in the right colon but otherwise the study was normal up to the terminal ileum. She also then had another colonoscopy in 08/2013 at which time she was found to have scattered diverticula in the entire examined colon. The examined portion of the ileum was again normal. She had loss of some vascular markings so was biopsied to rule out microscopic colitis. These biopsies showed benign colonic mucosa with increased intraepithelial lymphocytes.  After that she was treated with a course of prednisone, but I do not have any office visits following that treatment. Prior to that she had been treated with Xifaxan, probiotics, and cholestyramine treatment of IBS/SIBO/bile salt related diarrhea. Celiac labs are negative in 2010 and 2013.  She does not recall whether any of those medications had helped.  She is s/p cholecystectomy.  Past Medical History:  Diagnosis Date  . Colitis   . Diabetes mellitus without complication ([facility] [place])   . Fibroid,  uterine   . Headache(784.0)   . Herpes   . Hypertension   . Hypothyroidism    Past Surgical History:  Procedure Laterality Date  . ABDOMINAL HYSTERECTOMY    . CHOLECYSTECTOMY    . COLONOSCOPY    . KNEE SURGERY    . LIPOSUCTION N/A 11/18/2014   Procedure: LIPOSUCTION;  Surgeon: Cristine Polio, MD;  Location: [facility];  Service: Plastics;  Laterality: N/A;  . PANNICULECTOMY  11/18/2014   with repair / diastasis recti  . PANNICULECTOMY N/A 11/18/2014   Procedure: PANNICULECTOMY WITH REPAIR OF DIASTASIS RECTI ;  Surgeon: Cristine Polio, MD;  Location: [facility];  Service: Plastics;  Laterality: N/A;  . POLYPECTOMY    . THYROID SURGERY    . TUBAL LIGATION      reports that she has never smoked. She has never used smokeless tobacco. She reports that she drinks alcohol. She reports that she does not use drugs. family history includes Allergic rhinitis in her daughter and son; Asthma in her daughter and son; Diabetes in her mother and other; Eczema in her daughter and son; Hypertension in her mother and other. Allergies  Allergen Reactions  . Corn Starch Hives and Itching    Powder in gloves      Outpatient Encounter Prescriptions as of 06/22/2016  Medication Sig  . acyclovir ointment (ZOVIRAX) 5 %   . amLODipine (NORVASC) 10 MG tablet   . BAYER CONTOUR TEST test strip   . bisacodyl (DULCOLAX) 5 MG EC tablet  Take 15 mg by mouth daily as needed for mild constipation or moderate constipation. Reported on 10/31/2015  . ciprofloxacin (CIPRO) 500 MG tablet Take 500 mg by mouth every 12 (twelve) hours. Reported on 10/31/2015  . losartan-hydrochlorothiazide (HYZAAR) 100-12.5 MG tablet TK 1 T PO QD  . lubiprostone (AMITIZA) 24 MCG capsule Take 1 capsule (24 mcg total) by mouth 2 (two) times daily with a meal.  . promethazine (PHENERGAN) 25 MG tablet Take 25 mg by mouth every 4 (four) hours as needed for nausea or vomiting. Reported on 10/31/2015  . valACYclovir (VALTREX) 1000 MG tablet   .  cholestyramine light (PREVALITE) 4 g packet Take 1 packet (4 g total) by mouth 2 (two) times daily.  . [DISCONTINUED] HYDROcodone-acetaminophen (NORCO) 10-325 MG per tablet Take 1 tablet by mouth every 4 (four) hours as needed for moderate pain. Reported on 10/31/2015  . [DISCONTINUED] HYDROcodone-ibuprofen (VICOPROFEN) 7.5-200 MG tablet Reported on 10/31/2015  . [DISCONTINUED] Rubber Goods (ENEMA BOTTLE) MISC 1 Bottle by Does not apply route once as needed (constipation). Reported on 10/31/2015   No facility-administered encounter medications on file as of 06/22/2016.      REVIEW OF SYSTEMS  : All other systems reviewed and negative except where noted in the History of Present Illness.   PHYSICAL EXAM: BP 132/90   Pulse 60   Ht 5\' 3"  (1.6 m)   Wt 191 lb 12.8 oz (87 kg)   BMI 33.98 kg/m  General: Well developed black female in no acute distress Head: Normocephalic and atraumatic Eyes:  Sclerae anicteric ,conjunctiva pink. Ears: Normal auditory acuity Lungs: Clear throughout to auscultation Heart: Regular rate and rhythm Abdomen: Soft, non-distended.  BS present.  Non-tender. Musculoskeletal: Symmetrical with no gross deformities  Skin: No lesions on visible extremities Extremities: No edema  Neurological: Alert oriented x 4, grossly non-focal Psychological:  Alert and cooperative. Normal mood and affect  ASSESSMENT AND PLAN: -33 female with recurrent/ongoing issues with diarrhea/loose stools and some associated incontinence for several years.  ? Lymphocytic colitis on colonoscopy biopsies from 2015.  Also treated for possible bile-salt related diarrhea at one point as well as IBS/SIBO.  Does not recall what, if any, of the treatments helped.  Celiac labs negative.  I think that we will start by placing her back on questran BID to see if this helps.  Also will check CBC, BMP, and TSH today.  Will return in 4 weeks for follow-up and if no improvement then would consider Uceris.  CC:   Harvie Junior, MD

## 2016-06-22 NOTE — Patient Instructions (Addendum)
If you are age 43 or older, your body mass index should be between 23-30. Your Body mass index is 33.98 kg/m. If this is out of the aforementioned range listed, please consider follow up with your Primary Care Provider.  If you are age 28 or younger, your body mass index should be between 19-25. Your Body mass index is 33.98 kg/m. If this is out of the aformentioned range listed, please consider follow up with your Primary Care Provider.   We have sent the following medications to your pharmacy for you to pick up at your convenience:  Questran 4mg  powder packet. Take twice daily.  Your physician has requested that you go to the basement for the following lab work before leaving today:  CBC, BMET, TSH  Please follow up with Alonza Bogus, PA in 4 weeks. 07/21/16 at 10:30am.

## 2016-06-23 ENCOUNTER — Ambulatory Visit: Payer: BLUE CROSS/BLUE SHIELD | Admitting: Rheumatology

## 2016-07-01 ENCOUNTER — Other Ambulatory Visit (INDEPENDENT_AMBULATORY_CARE_PROVIDER_SITE_OTHER): Payer: BLUE CROSS/BLUE SHIELD

## 2016-07-01 DIAGNOSIS — R197 Diarrhea, unspecified: Secondary | ICD-10-CM | POA: Diagnosis not present

## 2016-07-01 LAB — CBC WITH DIFFERENTIAL/PLATELET
Basophils Absolute: 0 10*3/uL (ref 0.0–0.1)
Basophils Relative: 0.4 % (ref 0.0–3.0)
EOS ABS: 0.1 10*3/uL (ref 0.0–0.7)
EOS PCT: 1.9 % (ref 0.0–5.0)
HEMATOCRIT: 42.5 % (ref 36.0–46.0)
HEMOGLOBIN: 14.5 g/dL (ref 12.0–15.0)
LYMPHS PCT: 51.8 % — AB (ref 12.0–46.0)
Lymphs Abs: 2.6 10*3/uL (ref 0.7–4.0)
MCHC: 34.2 g/dL (ref 30.0–36.0)
MCV: 88.2 fl (ref 78.0–100.0)
MONO ABS: 0.4 10*3/uL (ref 0.1–1.0)
Monocytes Relative: 7.2 % (ref 3.0–12.0)
Neutro Abs: 2 10*3/uL (ref 1.4–7.7)
Neutrophils Relative %: 38.7 % — ABNORMAL LOW (ref 43.0–77.0)
Platelets: 218 10*3/uL (ref 150.0–400.0)
RBC: 4.81 Mil/uL (ref 3.87–5.11)
RDW: 13.6 % (ref 11.5–15.5)
WBC: 5.1 10*3/uL (ref 4.0–10.5)

## 2016-07-01 LAB — TSH: TSH: 0.55 u[IU]/mL (ref 0.35–4.50)

## 2016-07-01 LAB — BASIC METABOLIC PANEL
BUN: 11 mg/dL (ref 6–23)
CHLORIDE: 103 meq/L (ref 96–112)
CO2: 29 meq/L (ref 19–32)
Calcium: 8.6 mg/dL (ref 8.4–10.5)
Creatinine, Ser: 0.83 mg/dL (ref 0.40–1.20)
GFR: 96.38 mL/min (ref >60.00)
GLUCOSE: 201 mg/dL — AB (ref 70–99)
POTASSIUM: 4.4 meq/L (ref 3.5–5.1)
SODIUM: 139 meq/L (ref 135–145)

## 2016-07-07 ENCOUNTER — Ambulatory Visit: Payer: BLUE CROSS/BLUE SHIELD | Admitting: Gastroenterology

## 2016-07-14 DIAGNOSIS — R197 Diarrhea, unspecified: Secondary | ICD-10-CM | POA: Insufficient documentation

## 2016-07-14 DIAGNOSIS — R140 Abdominal distension (gaseous): Secondary | ICD-10-CM | POA: Insufficient documentation

## 2016-07-15 NOTE — Progress Notes (Signed)
Reviewed and agree with documentation and assessment and plan. K. Veena Jany Buckwalter , MD   

## 2016-07-21 ENCOUNTER — Encounter: Payer: Self-pay | Admitting: Gastroenterology

## 2016-07-21 ENCOUNTER — Ambulatory Visit (INDEPENDENT_AMBULATORY_CARE_PROVIDER_SITE_OTHER): Payer: BLUE CROSS/BLUE SHIELD | Admitting: Gastroenterology

## 2016-07-21 VITALS — BP 140/84 | HR 82 | Ht 63.0 in | Wt 189.0 lb

## 2016-07-21 DIAGNOSIS — K625 Hemorrhage of anus and rectum: Secondary | ICD-10-CM | POA: Diagnosis not present

## 2016-07-21 DIAGNOSIS — K529 Noninfective gastroenteritis and colitis, unspecified: Secondary | ICD-10-CM | POA: Diagnosis not present

## 2016-07-21 DIAGNOSIS — K52832 Lymphocytic colitis: Secondary | ICD-10-CM | POA: Insufficient documentation

## 2016-07-21 MED ORDER — NA SULFATE-K SULFATE-MG SULF 17.5-3.13-1.6 GM/177ML PO SOLN: 1.0000 | Freq: Once | ORAL | 0 refills | Status: AC

## 2016-07-21 NOTE — Patient Instructions (Signed)

## 2016-07-21 NOTE — Progress Notes (Signed)
Reviewed and agree with documentation and assessment and plan. K. Veena Nandigam , MD   

## 2016-07-21 NOTE — Progress Notes (Signed)
     07/21/2016 Angela Cisneros [id number] [birth year]   History of Present Illness:  This is a 45 year old female who is here for follow-up of her chronic diarrhea. Please see my note from November 21 for details surrounding this issue. At that visit she was placed on Questran twice a day. TSH was normal.  She is here for follow-up.  She says that she thought the Lucrezia Starch was working in the beginning, but now does not seem to be working as well. She does admit that she will now go 1 or 2 days without a bowel movement, but then when she does go it is like an explosion and she will still have some leakage when she is having the loose stools. Since her last visit she is also noted some rectal bleeding and mucus.  She would feel better for use to repeat colonoscopy and random biopsies.  Current Medications, Allergies, Past Medical History, Past Surgical History, Family History and Social History were reviewed in [employer] record.   Physical Exam: BP 140/84   Pulse 82   Ht 5\' 3"  (1.6 m)   Wt 189 lb (85.7 kg)   BMI 33.48 kg/m  General: Well developed black female in no acute distress Head: Normocephalic and atraumatic Eyes:  Sclerae anicteric, conjunctiva pink  Ears: Normal auditory acuity Lungs: Clear throughout to auscultation Heart: Regular rate and rhythm Abdomen: Soft, non-distended.  Normal bowel sounds.  Mild diffuse TTP.  Low transverse abdominal scar noted from panniculectomy.  Rectal:  Will be done at the time of colonoscopy. Musculoskeletal: Symmetrical with no gross deformities  Extremities: No edema  Neurological: Alert oriented x 4, grossly non-focal Psychological:  Alert and cooperative. Normal mood and affect  Assessment and Recommendations: -41 female with recurrent/ongoing issues with diarrhea/loose stools and some associated incontinence for several years.  ? Lymphocytic colitis on colonoscopy biopsies from 2015.  Also treated for possible  bile-salt related diarrhea at one point as well as IBS/SIBO.  Celiac labs negative.  Started on questran BID at last visit.  Possibly some improvement.  Also had some rectal bleeding recently as well.  I think that we will schedule her for repeat colonoscopy and repeat random biopsies again for evaluation. I considered trial of Uceris, but she is not able to swallow pills and usually just chews them. I don't know that Uceris could be taken by this way and still be effective.  Will continue questran for now if desired.  Schedule colonoscopy with Dr. Silverio Decamp.  *The risks, benefits, and alternatives to colonoscopy were discussed with the patient and she consents to proceed.

## 2016-08-09 ENCOUNTER — Telehealth: Payer: Self-pay | Admitting: Gastroenterology

## 2016-08-09 NOTE — Telephone Encounter (Signed)
She should take liquid imodium, one dose twice daily in addition to the other meds she is already taking.  This is OTC.

## 2016-08-09 NOTE — Telephone Encounter (Signed)
Spoke with pt and she is aware.

## 2016-08-09 NOTE — Telephone Encounter (Signed)
Dr. Ardis Hughs,  See earlier phone note. She is asking for a work excuse. She reports she was not able to go to work due to excess diarrhea. Ok to send note?

## 2016-08-09 NOTE — Telephone Encounter (Signed)
Pt was seen by Alonza Bogus PA on 07/21/16. Pt was having uncontrolled diarrhea with some blood in the stool. Had been taking questran bid and thought that was helping some but now pt states she is having BM's at night when she is sleeping and cannot control her bowels. She states there is some blood mixed in with the stool. Pt had ?lymphocytic colitis on colon bx done in 2015. She was treated with bile salts in the past for diarrhea but did not help. She is scheduled for colon later this month with Dr. Silverio Decamp. Pt cannot swallow pills, she usually chews them.  Dr. Ardis Hughs as doc of the day please advise.

## 2016-08-09 NOTE — Telephone Encounter (Signed)
That is OK with me.  Thanks! 

## 2016-08-09 NOTE — Telephone Encounter (Signed)
Patient notified  Letter put out front

## 2016-08-11 ENCOUNTER — Ambulatory Visit: Payer: BLUE CROSS/BLUE SHIELD | Admitting: Rheumatology

## 2016-08-18 ENCOUNTER — Telehealth: Payer: Self-pay | Admitting: *Deleted

## 2016-08-18 NOTE — Telephone Encounter (Signed)
Procedure rescheduled for 08-24-16; letter sent for her to show her employer.

## 2016-08-19 ENCOUNTER — Encounter: Payer: BLUE CROSS/BLUE SHIELD | Admitting: Gastroenterology

## 2016-08-24 ENCOUNTER — Encounter: Payer: Self-pay | Admitting: Gastroenterology

## 2016-08-24 ENCOUNTER — Ambulatory Visit (AMBULATORY_SURGERY_CENTER): Payer: BLUE CROSS/BLUE SHIELD | Admitting: Gastroenterology

## 2016-08-24 VITALS — BP 136/92 | HR 69 | Temp 99.1°F | Resp 11 | Ht 63.0 in | Wt 189.0 lb

## 2016-08-24 DIAGNOSIS — R197 Diarrhea, unspecified: Secondary | ICD-10-CM

## 2016-08-24 DIAGNOSIS — D125 Benign neoplasm of sigmoid colon: Secondary | ICD-10-CM

## 2016-08-24 DIAGNOSIS — K635 Polyp of colon: Secondary | ICD-10-CM | POA: Diagnosis not present

## 2016-08-24 DIAGNOSIS — K625 Hemorrhage of anus and rectum: Secondary | ICD-10-CM

## 2016-08-24 MED ORDER — SODIUM CHLORIDE 0.9 % IV SOLN: 500.0000 mL | INTRAVENOUS | Status: DC

## 2016-08-24 NOTE — Patient Instructions (Signed)
YOU HAD AN ENDOSCOPIC PROCEDURE TODAY AT [facility]:   Refer to the procedure report that was given to you for any specific questions about what was found during the examination.  If the procedure report does not answer your questions, please call your gastroenterologist to clarify.  If you requested that your care partner not be given the details of your procedure findings, then the procedure report has been included in a sealed envelope for you to review at your convenience later.  YOU SHOULD EXPECT: Some feelings of bloating in the abdomen. Passage of more gas than usual.  Walking can help get rid of the air that was put into your GI tract during the procedure and reduce the bloating. If you had a lower endoscopy (such as a colonoscopy or flexible sigmoidoscopy) you may notice spotting of blood in your stool or on the toilet paper. If you underwent a bowel prep for your procedure, you may not have a normal bowel movement for a few days.  Please Note:  You might notice some irritation and congestion in your nose or some drainage.  This is from the oxygen used during your procedure.  There is no need for concern and it should clear up in a day or so.  SYMPTOMS TO REPORT IMMEDIATELY:   Following lower endoscopy (colonoscopy or flexible sigmoidoscopy):  Excessive amounts of blood in the stool  Significant tenderness or worsening of abdominal pains  Swelling of the abdomen that is new, acute  Fever of 100F or higher   Following upper endoscopy (EGD)  Vomiting of blood or coffee ground material  New chest pain or pain under the shoulder blades  Painful or persistently difficult swallowing  New shortness of breath  Fever of 100F or higher  Black, tarry-looking stools  For urgent or emergent issues, a gastroenterologist can be reached at any hour by calling [phone].   DIET:  We do recommend a small meal at first, but then you may proceed to your regular diet.  Drink  plenty of fluids but you should avoid alcoholic beverages for 24 hours.  ACTIVITY:  You should plan to take it easy for the rest of today and you should NOT DRIVE or use heavy machinery until tomorrow (because of the sedation medicines used during the test).    FOLLOW UP: Our staff will call the number listed on your records the next business day following your procedure to check on you and address any questions or concerns that you may have regarding the information given to you following your procedure. If we do not reach you, we will leave a message.  However, if you are feeling well and you are not experiencing any problems, there is no need to return our call.  We will assume that you have returned to your regular daily activities without incident.  If any biopsies were taken you will be contacted by phone or by letter within the next 1-3 weeks.  Please call us at [phone] if you have not heard about the biopsies in 3 weeks.    SIGNATURES/CONFIDENTIALITY: You and/or your care partner have signed paperwork which will be entered into your electronic medical record.  These signatures attest to the fact that that the information above on your After Visit Summary has been reviewed and is understood.  Full responsibility of the confidentiality of this discharge information lies with you and/or your care-partner.     Handouts were given to your care partner on  polyps, diverticulosis, hemorrhoids , and diarrhea handout. You may resume your current medications today. Await biopsy results. Please call if any questions or concerns.

## 2016-08-24 NOTE — Progress Notes (Signed)
Called to room to assist during endoscopic procedure.  Patient ID and intended procedure confirmed with present staff. Received instructions for my participation in the procedure from the performing physician.  

## 2016-08-24 NOTE — Op Note (Signed)
[facility] Patient Name: Angela Cisneros Procedure Date: 08/24/2016 11:00 AM MRN: [id number] Endoscopist: Mauri Pole , MD Age: 46 Referring MD:  Date of Birth: Apr 15, 1971 Gender: Female Account #: [id number] Procedure:                Colonoscopy Indications:              Evaluation of unexplained GI bleeding, Clinically                            significant diarrhea of unexplained origin Medicines:                Monitored Anesthesia Care Procedure:                Pre-Anesthesia Assessment:                           - Prior to the procedure, a History and Physical                            was performed, and patient medications and                            allergies were reviewed. The patient's tolerance of                            previous anesthesia was also reviewed. The risks                            and benefits of the procedure and the sedation                            options and risks were discussed with the patient.                            All questions were answered, and informed consent                            was obtained. Prior Anticoagulants: The patient has                            taken no previous anticoagulant or antiplatelet                            agents. ASA Grade Assessment: II - A patient with                            mild systemic disease. After reviewing the risks                            and benefits, the patient was deemed in                            satisfactory condition to undergo the procedure.  After obtaining informed consent, the colonoscope                            was passed under direct vision. Throughout the                            procedure, the patient's blood pressure, pulse, and                            oxygen saturations were monitored continuously. The                            Model CF-HQ190L [id number]) scope was introduced                            through  the anus and advanced to the the terminal                            ileum, with identification of the appendiceal                            orifice and IC valve. The colonoscopy was performed                            without difficulty. The patient tolerated the                            procedure well. The quality of the bowel                            preparation was excellent. The terminal ileum,                            ileocecal valve, appendiceal orifice, and rectum                            were photographed. Scope In: 11:12:27 AM Scope Out: 11:25:10 AM Scope Withdrawal Time: 0 hours 8 minutes 19 seconds  Total Procedure Duration: 0 hours 12 minutes 43 seconds  Findings:                 The perianal and digital rectal examinations were                            normal.                           A 3 mm polyp was found in the sigmoid colon. The                            polyp was sessile. The polyp was removed with a                            cold snare. Resection and retrieval were complete.  Multiple small and large-mouthed diverticula were                            found in the left colon.                           Normal mucosa was found in the entire colon.                            Biopsies for histology were taken with a cold                            forceps from the entire colon for evaluation of                            microscopic colitis.                           Non-bleeding internal hemorrhoids were found during                            retroflexion. The hemorrhoids were medium-sized.                           The terminal ileum appeared normal.                           The exam was otherwise without abnormality. Complications:            No immediate complications. Estimated Blood Loss:     Estimated blood loss was minimal. Impression:               - One 3 mm polyp in the sigmoid colon, removed with                             a cold snare. Resected and retrieved.                           - Diverticulosis in the left colon.                           - Normal mucosa in the entire examined colon.                            Biopsied.                           - Non-bleeding internal hemorrhoids.                           - The examined portion of the ileum was normal.                           - The examination was otherwise normal. Recommendation:           - Patient has a contact number available for  emergencies. The signs and symptoms of potential                            delayed complications were discussed with the                            patient. Return to normal activities tomorrow.                            Written discharge instructions were provided to the                            patient.                           - Resume previous diet.                           - Continue present medications.                           - Await pathology results.                           - Repeat colonoscopy in 5-10 years for surveillance                            based on pathology results.                           - Return to GI office PRN. Mauri Pole, MD 08/24/2016 11:29:40 AM This report has been signed electronically.

## 2016-08-24 NOTE — Progress Notes (Signed)
Report to PACU, RN, vss, BBS= Clear.  

## 2016-08-24 NOTE — Progress Notes (Signed)
No problems noted in the recovery room. maw 

## 2016-08-25 ENCOUNTER — Telehealth: Payer: Self-pay

## 2016-08-25 NOTE — Telephone Encounter (Signed)
  Follow up Call-  Call back number 08/24/2016  Post procedure Call Back phone  # [phone]  Permission to leave phone message Yes  Some recent data might be hidden    Patient was called for follow up after her procedure on 08/24/2016. No answer at the number given for follow up phone call. A message was left on the answering machine.

## 2016-08-25 NOTE — Telephone Encounter (Signed)
  Follow up Call-  Call back number 08/24/2016  Post procedure Call Back phone  # [phone]  Permission to leave phone message Yes  Some recent data might be hidden     Patient questions:  Do you have a fever, pain , or abdominal swelling? No. Pain Score  0 *  Have you tolerated food without any problems? Yes.    Have you been able to return to your normal activities? Yes.    Do you have any questions about your discharge instructions: Diet   No. Medications  No. Follow up visit  No.  Do you have questions or concerns about your Care? No.  Actions: * If pain score is 4 or above: No action needed, pain <4.

## 2016-08-27 ENCOUNTER — Encounter: Payer: Self-pay | Admitting: Gastroenterology

## 2016-10-18 DIAGNOSIS — L659 Nonscarring hair loss, unspecified: Secondary | ICD-10-CM | POA: Insufficient documentation

## 2016-10-18 DIAGNOSIS — R768 Other specified abnormal immunological findings in serum: Secondary | ICD-10-CM | POA: Insufficient documentation

## 2016-10-18 DIAGNOSIS — E119 Type 2 diabetes mellitus without complications: Secondary | ICD-10-CM | POA: Insufficient documentation

## 2016-10-18 DIAGNOSIS — R7689 Other specified abnormal immunological findings in serum: Secondary | ICD-10-CM | POA: Insufficient documentation

## 2016-10-18 DIAGNOSIS — R5383 Other fatigue: Secondary | ICD-10-CM | POA: Insufficient documentation

## 2016-10-18 DIAGNOSIS — I10 Essential (primary) hypertension: Secondary | ICD-10-CM | POA: Insufficient documentation

## 2016-10-18 NOTE — Progress Notes (Signed)
Office Visit Note  Patient: Angela Cisneros             Date of Birth: 02-10-1971           MRN: [id number]             PCP: Mauricio Po, FNP Referring: Harvie Junior, MD Visit Date: 10/21/2016 Occupation: Bus driver    Subjective:  . Pain in multiple joints   History of Present Illness: Angela Cisneros is a 46 y.o. female with history of some osteoarthritis. According to patient for the last 3 days she's been having increased pain. She describes pain in her left fourth and fifth finger. Left trochanteric area and left knee joint. She's also having nocturnal pain in the left trochanteric area. She denies any joint swelling. She continues to have some fatigue.  Activities of Daily Living:  Patient reports morning stiffness for0 minute.   Patient Reports nocturnal pain. Left hip Difficulty dressing/grooming: Denies Difficulty climbing stairs: Reports Difficulty getting out of chair: Denies Difficulty using hands for taps, buttons, cutlery, and/or writing: Denies   Review of Systems  Constitutional: Positive for fatigue. Negative for night sweats, weight gain, weight loss and weakness.  HENT: Negative for mouth sores, trouble swallowing, trouble swallowing, mouth dryness and nose dryness.   Eyes: Negative for pain, redness, visual disturbance and dryness.  Respiratory: Negative for cough, shortness of breath and difficulty breathing.   Cardiovascular: Negative for chest pain, palpitations, hypertension, irregular heartbeat and swelling in legs/feet.  Gastrointestinal: Positive for constipation and diarrhea. Negative for blood in stool.       History of IBS  Endocrine: Negative for increased urination.  Genitourinary: Negative for vaginal dryness.  Musculoskeletal: Positive for arthralgias and joint pain. Negative for joint swelling, myalgias, muscle weakness, morning stiffness, muscle tenderness and myalgias.  Skin: Positive for hair loss. Negative for color  change, rash, skin tightness, ulcers and sensitivity to sunlight.  Allergic/Immunologic: Negative for susceptible to infections.  Neurological: Negative for dizziness, memory loss and night sweats.  Hematological: Negative for swollen glands.  Psychiatric/Behavioral: Positive for sleep disturbance. Negative for depressed mood. The patient is not nervous/anxious.     PMFS History:  Patient Active Problem List   Diagnosis Date Noted  . Thyroid disease 10/20/2016  . ANA positive 10/18/2016  . Essential hypertension 10/18/2016  . Type 2 diabetes mellitus ([place]) 10/18/2016  . Other fatigue 10/18/2016  . Hair loss (thinning hair) 10/18/2016  . Rectal bleeding 07/21/2016  . Lymphocytic colitis 07/21/2016  . Chronic diarrhea 07/21/2016  . Diarrhea 07/14/2016  . Bloating 07/14/2016  . Myalgia 06/17/2016  . Bilateral primary osteoarthritis of knee 06/17/2016  . S/P panniculectomy 11/18/2014  . Abdominal pain 01/08/2009  . NAUSEA WITH VOMITING 01/08/2009  . TROCHANTERIC BURSITIS, RIGHT 12/12/2008  . ALLERGIC REACTION 10/11/2008    Past Medical History:  Diagnosis Date  . Colitis   . Diabetes mellitus without complication ([place])    weight controlled  . Fibroid, uterine   . Headache(784.0)   . Herpes   . Hypertension   . Hypothyroidism     Family History  Problem Relation Age of Onset  . Hypertension Other   . Diabetes Other   . Hypertension Mother   . Diabetes Mother   . Allergic rhinitis Son   . Asthma Son   . Eczema Son   . Allergic rhinitis Daughter   . Asthma Daughter   . Eczema Daughter   . Colon cancer Cousin  mat cousins father  . Anesthesia problems Neg Hx   . Hypotension Neg Hx   . Malignant hyperthermia Neg Hx   . Pseudochol deficiency Neg Hx   . Angioedema Neg Hx   . Atopy Neg Hx   . Immunodeficiency Neg Hx   . Urticaria Neg Hx    Past Surgical History:  Procedure Laterality Date  . CHOLECYSTECTOMY    . COLONOSCOPY    . KNEE SURGERY    . LIPOSUCTION  N/A 11/18/2014   Procedure: LIPOSUCTION;  Surgeon: Cristine Polio, MD;  Location: [facility];  Service: Plastics;  Laterality: N/A;  . PANNICULECTOMY  11/18/2014   with repair / diastasis recti  . PANNICULECTOMY N/A 11/18/2014   Procedure: PANNICULECTOMY WITH REPAIR OF DIASTASIS RECTI ;  Surgeon: Cristine Polio, MD;  Location: [facility];  Service: Plastics;  Laterality: N/A;  . POLYPECTOMY    . THYROID SURGERY    . TUBAL LIGATION    . VAGINAL HYSTERECTOMY     Social History   Social History Narrative   Fun/Hobby: No hobbies     Objective: Vital Signs: BP (!) 154/92   Pulse 70   Resp 12   Ht 5\' 3"  (1.6 m)   Wt 200 lb (90.7 kg)   BMI 35.43 kg/m    Physical Exam  Constitutional: She is oriented to person, place, and time. She appears well-developed and well-nourished.  HENT:  Head: Normocephalic and atraumatic.  Eyes: Conjunctivae and EOM are normal.  Neck: Normal range of motion.  Cardiovascular: Normal rate, regular rhythm, normal heart sounds and intact distal pulses.   Pulmonary/Chest: Effort normal and breath sounds normal.  Abdominal: Soft. Bowel sounds are normal.  Lymphadenopathy:    She has no cervical adenopathy.  Neurological: She is alert and oriented to person, place, and time.  Skin: Skin is warm and dry. Capillary refill takes less than 2 seconds.  Psychiatric: She has a normal mood and affect. Her behavior is normal.  Nursing note and vitals reviewed.    Musculoskeletal Exam: C-spine and thoracic lumbar good range of motion. Shoulder joints elbow joints wrist joint MCPs PIPs DIPs with good range of motion with no synovitis. She has some tenderness in the extensor tendon of her left fifth finger. She had tenderness over left trochanteric area consistent with trochanteric bursitis. Knee joint is full range of motion without any synovitis which she has some crepitus and discomfort with range of motion. No synovitis was noted.  CDAI Exam: No CDAI exam completed.     Investigation: Findings:  May 2017:  CBC was normal.  Comprehensive metabolic panel showed glucose of 285, sed rate 7.  UA showed 3+ glucose.  ANA was negative.  C3, C4 were normal.  RNP was 4.1.  Beta-2 anticardiolipin and lupus anticoagulant were negative.  Hepatitis panel and G6PD were normal.   In March 2017 her BMP showed glucose of 207.  LH and FSH were normal.  Hemoglobin A1c was 9.8% and uric acid 5.0.  ANA was positive, but no titer was given.  Her Tamala Julian, Scl-70, Ro, La, Jo, and centromere antibodies were negative.  RNP was low-titer positive.  12/03/2015  x-rays of bilateral knee joints, 2 views, today as she has been having ongoing pain and discomfort.  It showed bilateral moderate medial compartment narrowing, bilateral moderate patellofemoral narrowing without any chondrocalcinosis consistent with osteoarthritis and chondromalacia patella.    Imaging: No results found.  Speciality Comments: No specialty comments available.    Procedures:  No  procedures performed Allergies: Corn starch   Assessment / Plan:     Visit Diagnoses: ANA positive - history of +ANA(neg titer) +RNP Fatigue hair thinning  - Plan: ANA, RNP Antibody, C3 and C4. She has no clinical features of autoimmune disease on examination today.  Trochanteric bursitis of left hip: Severe pain and discomfort. We decided to hold off cortisone injection due to history of hypertension and diabetes. IT band exercises were demonstrated in the office and a handout was given.  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 I've advised her to buddy tape her fingers and also use Voltaren gel.  Primary osteoarthritis of both knees - With chondromalacia patella chronic pain prescription for Voltaren gel was given.  Other fatigue - Plan: VITAMIN D 25 Hydroxy (Vit-D Deficiency, Fractures), CK, CBC with Differential/Platelet  Hair loss (thinning hair)  History  of hypertension  History of diabetes mellitus  Lymphocytic colitis - History of gluten sensitivity    Orders: Orders Placed This Encounter  Procedures  . ANA  . RNP Antibody  . C3 and C4  . VITAMIN D 25 Hydroxy (Vit-D Deficiency, Fractures)  . CK  . CBC with Differential/Platelet   Meds ordered this encounter  Medications  . diclofenac sodium (VOLTAREN) 1 % GEL    Sig: Apply 4 g topically 4 (four) times daily.    Dispense:  5 Tube    Refill:  1    Face-to-face time spent with patient was 30 minutes. 50% of time was spent in counseling and coordination of care.  Follow-Up Instructions: Return in about 6 months (around 04/23/2017) for Osteoarthritis.   Bo Merino, MD  Note - This record has been created using Editor, commissioning.  Chart creation errors have been sought, but may not always  have been located. Such creation errors do not reflect on  the standard of medical care.

## 2016-10-20 ENCOUNTER — Other Ambulatory Visit (INDEPENDENT_AMBULATORY_CARE_PROVIDER_SITE_OTHER): Payer: BLUE CROSS/BLUE SHIELD

## 2016-10-20 ENCOUNTER — Encounter: Payer: Self-pay | Admitting: Family

## 2016-10-20 ENCOUNTER — Ambulatory Visit (INDEPENDENT_AMBULATORY_CARE_PROVIDER_SITE_OTHER): Payer: BLUE CROSS/BLUE SHIELD | Admitting: Family

## 2016-10-20 VITALS — BP 144/84 | HR 73 | Temp 98.5°F | Resp 16 | Ht 63.0 in | Wt 200.0 lb

## 2016-10-20 DIAGNOSIS — E119 Type 2 diabetes mellitus without complications: Secondary | ICD-10-CM

## 2016-10-20 DIAGNOSIS — I10 Essential (primary) hypertension: Secondary | ICD-10-CM | POA: Diagnosis not present

## 2016-10-20 DIAGNOSIS — E079 Disorder of thyroid, unspecified: Secondary | ICD-10-CM

## 2016-10-20 DIAGNOSIS — K529 Noninfective gastroenteritis and colitis, unspecified: Secondary | ICD-10-CM | POA: Diagnosis not present

## 2016-10-20 LAB — COMPREHENSIVE METABOLIC PANEL
ALBUMIN: 3.8 g/dL (ref 3.5–5.2)
ALT: 15 U/L (ref 0–35)
AST: 12 U/L (ref 0–37)
Alkaline Phosphatase: 83 U/L (ref 39–117)
BUN: 11 mg/dL (ref 6–23)
CO2: 26 mEq/L (ref 19–32)
Calcium: 9.2 mg/dL (ref 8.4–10.5)
Chloride: 103 mEq/L (ref 96–112)
Creatinine, Ser: 0.73 mg/dL (ref 0.40–1.20)
GFR: 111.61 mL/min (ref >60.00)
Glucose, Bld: 209 mg/dL — ABNORMAL HIGH (ref 70–99)
Potassium: 4.4 mEq/L (ref 3.5–5.1)
Sodium: 134 mEq/L — ABNORMAL LOW (ref 135–145)
TOTAL PROTEIN: 6.7 g/dL (ref 6.0–8.3)
Total Bilirubin: 0.3 mg/dL (ref 0.2–1.2)

## 2016-10-20 LAB — HEMOGLOBIN A1C: Hgb A1c MFr Bld: 8.2 % — ABNORMAL HIGH (ref 4.6–6.5)

## 2016-10-20 LAB — TSH: TSH: 0.41 u[IU]/mL (ref 0.35–4.50)

## 2016-10-20 MED ORDER — METFORMIN HCL ER 500 MG PO TB24: 500.0000 mg | ORAL_TABLET | Freq: Every day | ORAL | 2 refills | Status: DC

## 2016-10-20 MED ORDER — LOSARTAN POTASSIUM-HCTZ 100-12.5 MG PO TABS: 1.0000 | ORAL_TABLET | Freq: Every day | ORAL | 1 refills | Status: DC

## 2016-10-20 NOTE — Patient Instructions (Signed)
Thank you for choosing [facility].  SUMMARY AND INSTRUCTIONS:  Please restart taking her lisinopril-hydrochlorothiazide.  Monitor your blood pressure at home as able.  Follow low-sodium diet.  We will check your thyroid function and blood sugar levels.  Try Viberzi. If it works we can help with additional prescription.  Medication:  Your prescription(s) have been submitted to your pharmacy or been printed and provided for you. Please take as directed and contact our office if you believe you are having problem(s) with the medication(s) or have any questions.  Labs:  Please stop by the lab on the lower level of the building for your blood work. Your results will be released to [facility] (or called to you) after review, usually within 72 hours after test completion. If any changes need to be made, you will be notified at that same time.  1.) The lab is open from 7:30am to 5:30 pm Monday-Friday 2.) No appointment is necessary 3.) Fasting (if needed) is 6-8 hours after food and drink; black coffee and water are okay   Follow up:  If your symptoms worsen or fail to improve, please contact our office for further instruction, or in case of emergency go directly to the emergency room at the closest medical facility.    DASH Eating Plan DASH stands for "Dietary Approaches to Stop Hypertension." The DASH eating plan is a healthy eating plan that has been shown to reduce high blood pressure (hypertension). It may also reduce your risk for type 2 diabetes, heart disease, and stroke. The DASH eating plan may also help with weight loss. What are tips for following this plan? General guidelines   Avoid eating more than 2,300 mg (milligrams) of salt (sodium) a day. If you have hypertension, you may need to reduce your sodium intake to 1,500 mg a day.  Limit alcohol intake to no more than 1 drink a day for nonpregnant women and 2 drinks a day for men. One drink equals 12 oz of beer, 5 oz  of wine, or 1 oz of hard liquor.  Work with your health care provider to maintain a healthy body weight or to lose weight. Ask what an ideal weight is for you.  Get at least 30 minutes of exercise that causes your heart to beat faster (aerobic exercise) most days of the week. Activities may include walking, swimming, or biking.  Work with your health care provider or diet and nutrition specialist (dietitian) to adjust your eating plan to your individual calorie needs. Reading food labels   Check food labels for the amount of sodium per serving. Choose foods with less than 5 percent of the Daily Value of sodium. Generally, foods with less than 300 mg of sodium per serving fit into this eating plan.  To find whole grains, look for the word "whole" as the first word in the ingredient list. Shopping   Buy products labeled as "low-sodium" or "no salt added."  Buy fresh foods. Avoid canned foods and premade or frozen meals. Cooking   Avoid adding salt when cooking. Use salt-free seasonings or herbs instead of table salt or sea salt. Check with your health care provider or pharmacist before using salt substitutes.  Do not fry foods. Cook foods using healthy methods such as baking, boiling, grilling, and broiling instead.  Cook with heart-healthy oils, such as olive, canola, soybean, or sunflower oil. Meal planning    Eat a balanced diet that includes:  5 or more servings of fruits and vegetables each  day. At each meal, try to fill half of your plate with fruits and vegetables.  Up to 6-8 servings of whole grains each day.  Less than 6 oz of lean meat, poultry, or fish each day. A 3-oz serving of meat is about the same size as a deck of cards. One egg equals 1 oz.  2 servings of low-fat dairy each day.  A serving of nuts, seeds, or beans 5 times each week.  Heart-healthy fats. Healthy fats called Omega-3 fatty acids are found in foods such as flaxseeds and coldwater fish, like  sardines, salmon, and mackerel.  Limit how much you eat of the following:  Canned or prepackaged foods.  Food that is high in trans fat, such as fried foods.  Food that is high in saturated fat, such as fatty meat.  Sweets, desserts, sugary drinks, and other foods with added sugar.  Full-fat dairy products.  Do not salt foods before eating.  Try to eat at least 2 vegetarian meals each week.  Eat more home-cooked food and less restaurant, buffet, and fast food.  When eating at a restaurant, ask that your food be prepared with less salt or no salt, if possible. What foods are recommended? The items listed may not be a complete list. Talk with your dietitian about what dietary choices are best for you. Grains  Whole-grain or whole-wheat bread. Whole-grain or whole-wheat pasta. Brown rice. [place]. Bulgur. Whole-grain and low-sodium cereals. Pita bread. Low-fat, low-sodium crackers. Whole-wheat flour tortillas. Vegetables  Fresh or frozen vegetables (raw, steamed, roasted, or grilled). Low-sodium or reduced-sodium tomato and vegetable juice. Low-sodium or reduced-sodium tomato sauce and tomato paste. Low-sodium or reduced-sodium canned vegetables. Fruits  All fresh, dried, or frozen fruit. Canned fruit in natural juice (without added sugar). Meat and other protein foods  Skinless chicken or [place]. Ground chicken or [place]. Pork with fat trimmed off. Fish and seafood. Egg whites. Dried beans, peas, or lentils. Unsalted nuts, nut butters, and seeds. Unsalted canned beans. Lean cuts of beef with fat trimmed off. Low-sodium, lean deli meat. Dairy  Low-fat (1%) or fat-free (skim) milk. Fat-free, low-fat, or reduced-fat cheeses. Nonfat, low-sodium ricotta or cottage cheese. Low-fat or nonfat yogurt. Low-fat, low-sodium cheese. Fats and oils  Soft margarine without trans fats. Vegetable oil. Low-fat, reduced-fat, or light mayonnaise and salad dressings (reduced-sodium). Canola,  safflower, olive, soybean, and sunflower oils. Avocado. Seasoning and other foods  Herbs. Spices. Seasoning mixes without salt. Unsalted popcorn and pretzels. Fat-free sweets. What foods are not recommended? The items listed may not be a complete list. Talk with your dietitian about what dietary choices are best for you. Grains  Baked goods made with fat, such as croissants, muffins, or some breads. Dry pasta or rice meal packs. Vegetables  Creamed or fried vegetables. Vegetables in a cheese sauce. Regular canned vegetables (not low-sodium or reduced-sodium). Regular canned tomato sauce and paste (not low-sodium or reduced-sodium). Regular tomato and vegetable juice (not low-sodium or reduced-sodium). Angie Fava. Olives. Fruits  Canned fruit in a light or heavy syrup. Fried fruit. Fruit in cream or butter sauce. Meat and other protein foods  Fatty cuts of meat. Ribs. Fried meat. Berniece Salines. Sausage. Bologna and other processed lunch meats. Salami. Fatback. Hotdogs. Bratwurst. Salted nuts and seeds. Canned beans with added salt. Canned or smoked fish. Whole eggs or egg yolks. Chicken or [place] with skin. Dairy  Whole or 2% milk, cream, and half-and-half. Whole or full-fat cream cheese. Whole-fat or sweetened yogurt. Full-fat cheese. Nondairy creamers.  Whipped toppings. Processed cheese and cheese spreads. Fats and oils  Butter. Stick margarine. Lard. Shortening. Ghee. Bacon fat. Tropical oils, such as coconut, palm kernel, or palm oil. Seasoning and other foods  Salted popcorn and pretzels. Onion salt, garlic salt, seasoned salt, table salt, and sea salt. Worcestershire sauce. Tartar sauce. Barbecue sauce. Teriyaki sauce. Soy sauce, including reduced-sodium. Steak sauce. Canned and packaged gravies. Fish sauce. Oyster sauce. Cocktail sauce. Horseradish that you find on the shelf. Ketchup. Mustard. Meat flavorings and tenderizers. Bouillon cubes. Hot sauce and Tabasco sauce. Premade or packaged marinades.  Premade or packaged taco seasonings. Relishes. Regular salad dressings. Where to find more information:  National Heart, Lung, and [place]: https://wilson-eaton.com/  American Heart Association: www.heart.org Summary  The DASH eating plan is a healthy eating plan that has been shown to reduce high blood pressure (hypertension). It may also reduce your risk for type 2 diabetes, heart disease, and stroke.  With the DASH eating plan, you should limit salt (sodium) intake to 2,300 mg a day. If you have hypertension, you may need to reduce your sodium intake to 1,500 mg a day.  When on the DASH eating plan, aim to eat more fresh fruits and vegetables, whole grains, lean proteins, low-fat dairy, and heart-healthy fats.  Work with your health care provider or diet and nutrition specialist (dietitian) to adjust your eating plan to your individual calorie needs. This information is not intended to replace advice given to you by your health care provider. Make sure you discuss any questions you have with your health care provider. Document Released: 07/08/2011 Document Revised: 07/12/2016 Document Reviewed: 07/12/2016 Elsevier Interactive Patient Education  2017 [employer].

## 2016-10-20 NOTE — Assessment & Plan Note (Signed)
Chronic diarrhea refractory to cholestyramine, Xifaxan and probiotics. Continues to experience multiple bowel movements with cramping. Recommend follow up with gastroenterolgy as patient is unable to take Viberzi secondary to previous cholecystectomy.

## 2016-10-20 NOTE — Assessment & Plan Note (Signed)
Currently maintained on lifestyle management with no adverse side effects or symptoms of end organ damage. Diabetic foot exam completed today. Encouraged complete diabetic eye exam independently. Obtain A1c and urine microalbumin. Declines Pneumovax. Maintained on losartan for CAD risk reduction and blood pressure. Continue with lifestyle management pending A1c results.

## 2016-10-20 NOTE — Assessment & Plan Note (Signed)
Blood pressure slightly elevated above goal of 140/90 and she has been out of medication for the past several months secondary to her most recent provider retiring. Refill losartan-hydrochlorothiazide. Denies worst headache of life with no symptoms of end organ damage noted on physical exam. Encouraged to monitor blood pressure at home and follow low-sodium diet. Information on Dash eating plan provided and after visit summary. Continue to monitor.

## 2016-10-20 NOTE — Progress Notes (Signed)
Subjective:    Patient ID: Angela Cisneros, female    DOB: September 10, 1970, 46 y.o.   MRN: [id number]  Chief Complaint  Patient presents with  . Establish Care    needs BP medication     HPI:  Angela Cisneros is a 46 y.o. female who  has a past medical history of Colitis; Diabetes mellitus without complication ([place]); Fibroid, uterine; Headache(784.0); Herpes; Hypertension; and Hypothyroidism. and presents today for an office visit to establish care.  1.) Hypertension - Currently maintained on lisinopril-hydrochlorothiazide and reports that she has been out of the medication for several months. No adverse side effects when taking the medications. No hypotensive readings. No currently checking blood pressure at home. Has had some mild headaches but denies changes in vision, worst headache of life or new symptoms of end organ damage. Working on following a low sodium diet. Physical activity is daily through her job.   BP Readings from Last 3 Encounters:  10/20/16 (!) 144/84  08/24/16 (!) 136/92  07/21/16 140/84    2.) Hypothyroidism - Not currently taking medication. Previous history of thyroid disease. Denies any changes to skin, hair, or nails. No temperature intolerance.   Lab Results  Component Value Date   TSH 0.41 10/20/2016     3.) Diabetes - Not currently maintained on medication and controlled with lifestyle. No changes in vision or numbness/tingling. Does not check blood sugars on a regular basis. Working on nutritional intake. Due for a diabetic eye and foot exam. Has not had Pneumovax.    4.) Chronic diarrhea - Continues to experience associated symptom of chronic diarrhea and abdominal cramping that is refractory to cholestyramine and previous treatments with Xifaxin and probiotics. Colonoscopy showed multiple colonic polyps with some tubular adenomas and hyperplastic polyps. Describes multiple bowel movements throughout the day and cramping that occurs prior to the  bowel movement. Followed by gastroenterology as well with Celiac labs being negative. Considering possible trial of Uceris.     Allergies  Allergen Reactions  . Corn Starch Hives and Itching    Powder in gloves      Outpatient Medications Prior to Visit  Medication Sig Dispense Refill  . losartan-hydrochlorothiazide (HYZAAR) 100-12.5 MG tablet TK 1 T PO QD  1  . acyclovir ointment (ZOVIRAX) 5 % Apply 1 application topically daily as needed.   2  . cholestyramine light (PREVALITE) 4 g packet Take 1 packet (4 g total) by mouth 2 (two) times daily. 60 packet 2  . valACYclovir (VALTREX) 1000 MG tablet   3  . 0.9 %  sodium chloride infusion      No facility-administered medications prior to visit.      Past Medical History:  Diagnosis Date  . Colitis   . Diabetes mellitus without complication ([place])    weight controlled  . Fibroid, uterine   . Headache(784.0)   . Herpes   . Hypertension   . Hypothyroidism       Past Surgical History:  Procedure Laterality Date  . CHOLECYSTECTOMY    . COLONOSCOPY    . KNEE SURGERY    . LIPOSUCTION N/A 11/18/2014   Procedure: LIPOSUCTION;  Surgeon: Cristine Polio, MD;  Location: [facility];  Service: Plastics;  Laterality: N/A;  . PANNICULECTOMY  11/18/2014   with repair / diastasis recti  . PANNICULECTOMY N/A 11/18/2014   Procedure: PANNICULECTOMY WITH REPAIR OF DIASTASIS RECTI ;  Surgeon: Cristine Polio, MD;  Location: [facility];  Service: Plastics;  Laterality: N/A;  .  POLYPECTOMY    . THYROID SURGERY    . TUBAL LIGATION    . VAGINAL HYSTERECTOMY        Family History  Problem Relation Age of Onset  . Hypertension Other   . Diabetes Other   . Hypertension Mother   . Diabetes Mother   . Allergic rhinitis Son   . Asthma Son   . Eczema Son   . Allergic rhinitis Daughter   . Asthma Daughter   . Eczema Daughter   . Colon cancer Cousin     mat cousins father  . Anesthesia problems Neg Hx   . Hypotension Neg Hx   . Malignant  hyperthermia Neg Hx   . Pseudochol deficiency Neg Hx   . Angioedema Neg Hx   . Atopy Neg Hx   . Immunodeficiency Neg Hx   . Urticaria Neg Hx       Social History   Social History  . Marital status: Widowed    Spouse name: N/A  . Number of children: 6  . Years of education: 33   Occupational History  . Bus Driver    Social History Main Topics  . Smoking status: Never Smoker  . Smokeless tobacco: Never Used  . Alcohol use Yes     Comment: Occasionally  . Drug use: No  . Sexual activity: Yes    Partners: Male    Birth control/ protection: Surgical   Other Topics Concern  . Not on file   Social History Narrative   Fun/Hobby: No hobbies      Review of Systems  Constitutional: Negative for chills, fatigue and fever.  Eyes:       Denies changes in vision  Respiratory: Negative for cough, chest tightness, shortness of breath and wheezing.   Cardiovascular: Negative for chest pain, palpitations and leg swelling.  Endocrine: Negative for polydipsia, polyphagia and polyuria.  Neurological: Negative for dizziness, weakness, light-headedness and numbness.       Objective:    BP (!) 144/84 (BP Location: Left Arm, Patient Position: Sitting, Cuff Size: Large)   Pulse 73   Temp 98.5 F (36.9 C) (Oral)   Resp 16   Ht 5\' 3"  (1.6 m)   Wt 200 lb (90.7 kg)   SpO2 96%   BMI 35.43 kg/m  Nursing note and vital signs reviewed.  Physical Exam  Constitutional: She is oriented to person, place, and time. She appears well-developed and well-nourished. No distress.  Cardiovascular: Normal rate, regular rhythm, normal heart sounds and intact distal pulses.   Pulmonary/Chest: Effort normal and breath sounds normal.  Neurological: She is alert and oriented to person, place, and time.  Diabetic foot exam - bilateral feet are free from skin breakdown, cuts, and abrasions. Toenails are neatly trimmed. Pulses are intact and appropriate. Sensation is intact to monofilament bilaterally.   Skin: Skin is warm and dry.  Psychiatric: She has a normal mood and affect. Her behavior is normal. Judgment and thought content normal.        Assessment & Plan:   Problem List Items Addressed This Visit      Cardiovascular and Mediastinum   Essential hypertension - Primary    Blood pressure slightly elevated above goal of 140/90 and she has been out of medication for the past several months secondary to her most recent provider retiring. Refill losartan-hydrochlorothiazide. Denies worst headache of life with no symptoms of end organ damage noted on physical exam. Encouraged to monitor blood pressure at home and follow  low-sodium diet. Information on Dash eating plan provided and after visit summary. Continue to monitor.      Relevant Medications   losartan-hydrochlorothiazide (HYZAAR) 100-12.5 MG tablet   Other Relevant Orders   Comprehensive metabolic panel (Completed)     Digestive   Chronic diarrhea    Chronic diarrhea refractory to cholestyramine, Xifaxan and probiotics. Continues to experience multiple bowel movements with cramping. Recommend follow up with gastroenterolgy as patient is unable to take Viberzi secondary to previous cholecystectomy.        Endocrine   Type 2 diabetes mellitus ([place])    Currently maintained on lifestyle management with no adverse side effects or symptoms of end organ damage. Diabetic foot exam completed today. Encouraged complete diabetic eye exam independently. Obtain A1c and urine microalbumin. Declines Pneumovax. Maintained on losartan for CAD risk reduction and blood pressure. Continue with lifestyle management pending A1c results.      Relevant Medications   losartan-hydrochlorothiazide (HYZAAR) 100-12.5 MG tablet   Other Relevant Orders   Hemoglobin A1c (Completed)   Urine Microalbumin w/creat. ratio   Thyroid disease    Previous thyroid disease appears adequately controlled with no medications and most recent TSH of 0.55. Obtain  current TSH level. Continue with lifestyle management pending TSH results.      Relevant Orders   TSH (Completed)       I have discontinued Ms. Lemming's acyclovir ointment, valACYclovir, and cholestyramine light. I have also changed her losartan-hydrochlorothiazide. We will stop administering sodium chloride.   Meds ordered this encounter  Medications  . losartan-hydrochlorothiazide (HYZAAR) 100-12.5 MG tablet    Sig: Take 1 tablet by mouth daily.    Dispense:  90 tablet    Refill:  1    Order Specific Question:   Supervising Provider    Answer:   Pricilla Holm A [[id number]]     Follow-up: Return in about 3 months (around 01/20/2017), or if symptoms worsen or fail to improve.  Mauricio Po, FNP

## 2016-10-20 NOTE — Assessment & Plan Note (Signed)
Previous thyroid disease appears adequately controlled with no medications and most recent TSH of 0.55. Obtain current TSH level. Continue with lifestyle management pending TSH results.

## 2016-10-21 ENCOUNTER — Ambulatory Visit (INDEPENDENT_AMBULATORY_CARE_PROVIDER_SITE_OTHER): Payer: BLUE CROSS/BLUE SHIELD | Admitting: Rheumatology

## 2016-10-21 ENCOUNTER — Encounter: Payer: Self-pay | Admitting: Rheumatology

## 2016-10-21 VITALS — BP 154/92 | HR 70 | Resp 12 | Ht 63.0 in | Wt 200.0 lb

## 2016-10-21 DIAGNOSIS — R5383 Other fatigue: Secondary | ICD-10-CM | POA: Diagnosis not present

## 2016-10-21 DIAGNOSIS — M79642 Pain in left hand: Secondary | ICD-10-CM

## 2016-10-21 DIAGNOSIS — M7062 Trochanteric bursitis, left hip: Secondary | ICD-10-CM | POA: Diagnosis not present

## 2016-10-21 DIAGNOSIS — Z8679 Personal history of other diseases of the circulatory system: Secondary | ICD-10-CM

## 2016-10-21 DIAGNOSIS — L659 Nonscarring hair loss, unspecified: Secondary | ICD-10-CM

## 2016-10-21 DIAGNOSIS — R768 Other specified abnormal immunological findings in serum: Secondary | ICD-10-CM

## 2016-10-21 DIAGNOSIS — Z8639 Personal history of other endocrine, nutritional and metabolic disease: Secondary | ICD-10-CM

## 2016-10-21 DIAGNOSIS — M170 Bilateral primary osteoarthritis of knee: Secondary | ICD-10-CM | POA: Diagnosis not present

## 2016-10-21 DIAGNOSIS — K52832 Lymphocytic colitis: Secondary | ICD-10-CM

## 2016-10-21 LAB — CBC WITH DIFFERENTIAL/PLATELET
Basophils Absolute: 0 cells/uL (ref 0–200)
Basophils Relative: 0 %
EOS PCT: 2 %
Eosinophils Absolute: 124 cells/uL (ref 15–500)
HEMATOCRIT: 41.8 % (ref 35.0–45.0)
HEMOGLOBIN: 14 g/dL (ref 11.7–15.5)
LYMPHS ABS: 3472 {cells}/uL (ref 850–3900)
Lymphocytes Relative: 56 %
MCH: 30 pg (ref 27.0–33.0)
MCHC: 33.5 g/dL (ref 32.0–36.0)
MCV: 89.5 fL (ref 80.0–100.0)
MONO ABS: 434 {cells}/uL (ref 200–950)
MPV: 11 fL (ref 7.5–12.5)
Monocytes Relative: 7 %
NEUTROS ABS: 2170 {cells}/uL (ref 1500–7800)
NEUTROS PCT: 35 %
Platelets: 261 10*3/uL (ref 140–400)
RBC: 4.67 MIL/uL (ref 3.80–5.10)
RDW: 13.4 % (ref 11.0–15.0)
WBC: 6.2 10*3/uL (ref 3.8–10.8)

## 2016-10-21 MED ORDER — DICLOFENAC SODIUM 1 % TD GEL: 4.0000 g | Freq: Four times a day (QID) | TRANSDERMAL | 1 refills | Status: AC

## 2016-10-21 NOTE — Patient Instructions (Signed)
Iliotibial Bursitis Rehab Ask your health care provider which exercises are safe for you. Do exercises exactly as told by your health care provider and adjust them as directed. It is normal to feel mild stretching, pulling, tightness, or discomfort as you do these exercises, but you should stop right away if you feel sudden pain or your pain gets worse.Do not begin these exercises until told by your health care provider. Stretching and range of motion exercises These exercises warm up your muscles and joints and improve the movement and flexibility of your leg. These exercises also help to relieve pain and stiffness. Exercise A: Quadriceps stretch, prone   1. Lie on your abdomen on a firm surface, such as a bed or padded floor. 2. Bend your left / right knee and hold your ankle. If you cannot reach your ankle or pant leg, loop a belt around your foot and grab the belt instead. 3. Gently pull your heel toward your buttocks. Your knee should not slide out to the side. You should feel a stretch in the front of your thigh and knee. 4. Hold this position for __________ seconds. Repeat __________ times. Complete this exercise __________ times a day. Exercise B: Lunge (  adductor stretch) 1. Stand and spread your legs about 3 feet (about 1 m) apart. Put your left / right leg slightly back for balance. 2. Lean away from your left / right leg by bending your other knee and shifting your weight toward your bent knee. You may rest your hands on your thigh for balance. You should feel a stretch in your left / right inner thigh. 3. Hold for __________ seconds. Repeat __________ times. Complete this exercise __________ times a day. Exercise C: Hamstring stretch, supine  1. Lie on your back. 2. Hold both ends of a belt or towel as you loop it over the ball of your left / right foot. The ball of your foot is on the walking surface, right under your toes. 3. Straighten your left / right knee and slowly pull on  the belt to raise your leg. Stop when you feel a gentle stretch in the back of your left / right knee or thigh.  Do not let your left / right knee bend.  Keep your other leg flat on the floor. 4. Hold this position for __________ seconds. Repeat __________ times. Complete this exercise __________ times a day. Strengthening exercises These exercises build strength and endurance in your leg. Endurance is the ability to use your muscles for a long time, even after they get tired. Exercise D: Quadriceps wall slides  1. Lean your back against a smooth wall or door while you walk your feet out 18-24 inches (46-61 cm) from it. 2. Place your feet hip-width apart. 3. Slowly slide down the wall or door until your knees bend as far as told by your health care provider. Keep your knees over your heels, not your toes. Keep your knees in line with your hips. 4. Hold for __________ seconds. 5. Push through your heels to stand up to rest for __________ seconds after each repetition. Repeat __________ times. Complete this exercise __________ times a day. Exercise E: Straight leg raises ( hip abductors) 1. Lie on your side, with your left / right leg in the top position. Lie so your head, shoulder, knee, and hip line up with each other. You may bend your bottom knee to help you balance. 2. Lift your top leg 4-6 inches (10-15 cm) while   keeping your toes pointed straight ahead. 3. Hold this position for __________ seconds. 4. Slowly lower your leg to the starting position. Allow your muscles to relax completely after each repetition. Repeat __________ times. Complete this exercise __________ times a day. Exercise F: Straight leg raises ( hip extensors) 1. Lie on your abdomen on a firm surface. You can put a pillow under your hips if that is more comfortable. 2. Tense the muscles in your buttocks and lift your left / right leg about 4-6 inches (10-15 cm). Keep your knee straight as you lift your leg. 3. Hold  this position for __________ seconds. 4. Slowly lower your leg to the starting position. 5. Let your leg relax completely after each repetition. Repeat __________ times. Complete this exercise __________ times a day. Exercise G: Bridge ( hip extensors) 1. Lie on your back on a firm surface with your knees bent and your feet flat on the floor. 2. Tighten your buttocks muscles and lift your bottom off the floor until your trunk is level with your thighs.  Do not arch your back.  You should feel the muscles working in your buttocks and the back of your thighs. If you do not feel these muscles, slide your feet 1-2 inches (2.5-5 cm) farther away from your buttocks. 3. Hold this position for __________ seconds. 4. Slowly lower your hips to the starting position. 5. Let your buttocks muscles relax completely between repetitions. 6. If this exercise is too easy, try doing it with your arms crossed over your chest. Repeat __________ times. Complete this exercise __________ times a day. This information is not intended to replace advice given to you by your health care provider. Make sure you discuss any questions you have with your health care provider. Document Released: 07/19/2005 Document Revised: 03/25/2016 Document Reviewed: 07/01/2015 Elsevier Interactive Patient Education  2017 [employer]  

## 2016-10-22 ENCOUNTER — Telehealth: Payer: Self-pay | Admitting: Radiology

## 2016-10-22 DIAGNOSIS — E559 Vitamin D deficiency, unspecified: Secondary | ICD-10-CM

## 2016-10-22 LAB — C3 AND C4
C3 Complement: 166 mg/dL (ref 83–193)
C4 Complement: 41 mg/dL (ref 15–57)

## 2016-10-22 LAB — VITAMIN D 25 HYDROXY (VIT D DEFICIENCY, FRACTURES): VIT D 25 HYDROXY: 10 ng/mL — AB (ref 30–100)

## 2016-10-22 LAB — ANTI-NUCLEAR AB-TITER (ANA TITER)

## 2016-10-22 LAB — RNP ANTIBODY: Ribonucleic Protein(ENA) Antibody, IgG: 4.2 — ABNORMAL HIGH

## 2016-10-22 LAB — ANA: Anti Nuclear Antibody(ANA): POSITIVE — AB

## 2016-10-22 LAB — CK: CK TOTAL: 58 U/L (ref 7–177)

## 2016-10-22 MED ORDER — VITAMIN D3 1.25 MG (50000 UT) PO CAPS: 50000.0000 [IU] | ORAL_CAPSULE | ORAL | 0 refills | Status: AC

## 2016-10-22 NOTE — Telephone Encounter (Signed)
-----   Message from Bo Merino, MD sent at 10/22/2016  8:22 AM EDT ----- Vitamin D deficiency. Vitamin D 50,000 units once a week for 90 days. Check vitamin D in 3 months

## 2016-10-22 NOTE — Progress Notes (Signed)
Vitamin D deficiency. Vitamin D 50,000 units once a week for 90 days. Check vitamin D in 3 months

## 2016-10-22 NOTE — Progress Notes (Signed)
Autoimmune labs are stable

## 2016-11-09 ENCOUNTER — Ambulatory Visit: Payer: BLUE CROSS/BLUE SHIELD | Admitting: Gastroenterology

## 2016-11-11 ENCOUNTER — Other Ambulatory Visit: Payer: BLUE CROSS/BLUE SHIELD

## 2016-11-11 ENCOUNTER — Encounter: Payer: Self-pay | Admitting: Gastroenterology

## 2016-11-11 ENCOUNTER — Ambulatory Visit (INDEPENDENT_AMBULATORY_CARE_PROVIDER_SITE_OTHER): Payer: BLUE CROSS/BLUE SHIELD | Admitting: Gastroenterology

## 2016-11-11 VITALS — BP 134/72 | HR 70 | Ht 63.0 in | Wt 199.0 lb

## 2016-11-11 DIAGNOSIS — R197 Diarrhea, unspecified: Secondary | ICD-10-CM

## 2016-11-11 NOTE — Progress Notes (Signed)
11/11/2016 Angela Cisneros [id number] [birth year]   HISTORY OF PRESENT ILLNESS:  This is a 46 year old female who is here for follow-up of her chronic diarrhea. Please see my note from November 21 for details surrounding this issue.  Since that time evaluation has been performed as follows.  TSH was normal.  Celiac labs negative.  Colonoscopy normal in 09/2016 with normal random biopsies.  She has been on questran BID and says that she thought the Questran was working in the beginning, but now does not seem to be working as well. She does admit that she will go sometimes up to 4 days without a bowel movement, but then when she does go it is like an explosion with urgency. Misses a lot of work due to her symptoms.  I received FMLA paperwork to sign for her.  Does not have a gallbladder.  Cannot swallow pills.  Says that sometimes her stools are greasy/foamy and float.  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     Past Medical History:  Diagnosis Date  . Diabetes mellitus without complication ([place])    weight controlled  . Fibroid, uterine   . Headache(784.0)   . Herpes   . Hypertension   . Hypothyroidism    Past Surgical History:  Procedure Laterality Date  . CHOLECYSTECTOMY    . COLONOSCOPY    . KNEE SURGERY    . LIPOSUCTION N/A 11/18/2014   Procedure: LIPOSUCTION;  Surgeon: Cristine Polio, MD;  Location: [place];  Service: Plastics;  Laterality: N/A;  . PANNICULECTOMY  11/18/2014   with repair / diastasis recti  . PANNICULECTOMY N/A 11/18/2014   Procedure: PANNICULECTOMY WITH REPAIR OF DIASTASIS RECTI ;  Surgeon: Cristine Polio, MD;  Location: [place];  Service: Plastics;  Laterality: N/A;  . POLYPECTOMY    . THYROID SURGERY    . TUBAL LIGATION    . VAGINAL HYSTERECTOMY      reports that she has never smoked. She has never used smokeless tobacco. She reports that she drinks alcohol. She reports that  she does not use drugs. family history includes Allergic rhinitis in her daughter and son; Asthma in her daughter and son; Colon cancer in her cousin; Diabetes in her mother and other; Eczema in her daughter and son; Hypertension in her mother and other. Allergies  Allergen Reactions  . Corn Starch Hives and Itching    Powder in gloves      Outpatient Encounter Prescriptions as of 11/11/2016  Medication Sig  . Cholecalciferol (VITAMIN D3) 50000 units CAPS Take 50,000 Units by mouth once a week.  . cholestyramine (QUESTRAN) 4 g packet Take 4 g by mouth 2 (two) times daily.  . diclofenac sodium (VOLTAREN) 1 % GEL Apply 4 g topically 4 (four) times daily.  Marland Kitchen losartan-hydrochlorothiazide (HYZAAR) 100-12.5 MG tablet Take 1 tablet by mouth daily.  . valACYclovir (VALTREX) 500 MG tablet Take 500 mg by mouth 2 (two) times daily.  . metFORMIN (GLUCOPHAGE XR) 500 MG 24 hr tablet Take 1 tablet (500 mg total) by mouth daily with breakfast. (Patient not taking: Reported on 11/11/2016)   No facility-administered encounter medications on file as of 11/11/2016.      REVIEW OF SYSTEMS  : All other systems reviewed and negative except where noted in the History of Present Illness.   PHYSICAL EXAM: BP 134/72   Pulse 70  Ht 5\' 3"  (1.6 m)   Wt 199 lb (90.3 kg)   BMI 35.25 kg/m  General: Well developed black female in no acute distress Head: Normocephalic and atraumatic Eyes:  Sclerae anicteric, conjunctiva pink. Ears: Normal auditory acuity Lungs: Clear throughout to auscultation Heart: Regular rate and rhythm Abdomen: Soft, non-distended. Normal bowel sounds.  Mild diffuse lower abdominal TTP. Musculoskeletal: Symmetrical with no gross deformities  Skin: No lesions on visible extremities Extremities: No edema  Neurological: Alert oriented x 4, grossly non-focal Psychological:  Alert and cooperative. Normal mood and affect  ASSESSMENT AND PLAN: -12 female with recurrent/ongoing issues with  diarrhea/loose stools and some associated incontinence for several years.  Likely bile-salt related diarrhea as it occurs primarily after eating. May have some IBS as well.  Celiac labs negative.  Colonoscopy 09/2016 norma with normal random biopsies.  Initially had improvement on questran BID, but symptoms worsening again.  Cannot use Viberzi since she does not have a gallbladder.  She also sometimes goes up to 4 days without a BM.  I think that she is going to be difficult to manage due to these reasons along with the fact that she cannot swallow pills and either has to take liquid or she crushes or chews her pills.  I'm going to check a pancreatic fecal elastase stool test. She'll continue on her Questran twice a day for now and will use over-the-counter liquid Imodium. Can use on a daily basis if needed and titrate to desirable amount. She will follow-up with Dr. Silverio Decamp in 4-6 weeks.   CC:  Harvie Junior, MD

## 2016-11-11 NOTE — Patient Instructions (Addendum)
Your physician has requested that you go to the basement for the following lab work before leaving today.  Get liquid Imodium take as directed.  Normal BMI (Body Mass Index- based on height and weight) is between 23 and 30. Your BMI today is Body mass index is 35.25 kg/m. Marland Kitchen Please consider follow up  regarding your BMI with your Primary Care Provider.  Thank you

## 2016-11-15 NOTE — Progress Notes (Signed)
Reviewed and agree with documentation and assessment and plan. K. Veena Nandigam , MD   

## 2016-11-17 ENCOUNTER — Ambulatory Visit: Payer: BLUE CROSS/BLUE SHIELD | Admitting: Family

## 2016-12-02 ENCOUNTER — Other Ambulatory Visit: Payer: BLUE CROSS/BLUE SHIELD

## 2016-12-02 DIAGNOSIS — R197 Diarrhea, unspecified: Secondary | ICD-10-CM

## 2016-12-06 ENCOUNTER — Telehealth: Payer: Self-pay | Admitting: Gastroenterology

## 2016-12-06 NOTE — Telephone Encounter (Signed)
Pt has been advised the test is still in progress and we will call her as soon as resulted and Angela Cisneros reviews

## 2016-12-07 ENCOUNTER — Ambulatory Visit: Payer: BLUE CROSS/BLUE SHIELD | Admitting: Gastroenterology

## 2016-12-09 LAB — PANCREATIC ELASTASE, FECAL

## 2016-12-21 ENCOUNTER — Ambulatory Visit (INDEPENDENT_AMBULATORY_CARE_PROVIDER_SITE_OTHER): Payer: BLUE CROSS/BLUE SHIELD | Admitting: Orthopedic Surgery

## 2017-01-23 DIAGNOSIS — E039 Hypothyroidism, unspecified: Secondary | ICD-10-CM | POA: Insufficient documentation

## 2017-01-23 DIAGNOSIS — Z7984 Long term (current) use of oral hypoglycemic drugs: Secondary | ICD-10-CM | POA: Insufficient documentation

## 2017-01-23 DIAGNOSIS — E119 Type 2 diabetes mellitus without complications: Secondary | ICD-10-CM | POA: Diagnosis not present

## 2017-01-23 DIAGNOSIS — I10 Essential (primary) hypertension: Secondary | ICD-10-CM | POA: Insufficient documentation

## 2017-01-23 DIAGNOSIS — Z79899 Other long term (current) drug therapy: Secondary | ICD-10-CM | POA: Diagnosis not present

## 2017-01-23 DIAGNOSIS — R070 Pain in throat: Secondary | ICD-10-CM | POA: Diagnosis present

## 2017-01-23 NOTE — ED Triage Notes (Signed)
Patient reports symtpms began yesterday.  Reports feels like a "lump" on left side of throat.  Patient denies pain, just feels like something is there.  Patient with no respiratory distress noted, controlling own secretions.  No obvious swelling noted to neck.

## 2017-01-24 ENCOUNTER — Emergency Department: Payer: BLUE CROSS/BLUE SHIELD

## 2017-01-24 ENCOUNTER — Ambulatory Visit: Payer: BLUE CROSS/BLUE SHIELD | Admitting: Gastroenterology

## 2017-01-24 ENCOUNTER — Emergency Department
Admission: EM | Admit: 2017-01-24 | Discharge: 2017-01-24 | Disposition: A | Discharge status: Home / Self Care | Payer: BLUE CROSS/BLUE SHIELD | Attending: Emergency Medicine | Admitting: Emergency Medicine

## 2017-01-24 DIAGNOSIS — R070 Pain in throat: Secondary | ICD-10-CM

## 2017-01-24 LAB — POCT RAPID STREP A: STREPTOCOCCUS, GROUP A SCREEN (DIRECT): NEGATIVE

## 2017-01-24 MED ORDER — GI COCKTAIL ~~LOC~~
30.0000 mL | Freq: Once | ORAL | Status: AC
  Administered 2017-01-24: 30 mL via ORAL
  Filled 2017-01-24: qty 30

## 2017-01-24 MED ORDER — PREDNISONE 20 MG PO TABS: 60.0000 mg | ORAL_TABLET | Freq: Every day | ORAL | 0 refills | Status: AC

## 2017-01-24 MED ORDER — PREDNISONE 20 MG PO TABS
60.0000 mg | ORAL_TABLET | Freq: Once | ORAL | Status: AC
  Administered 2017-01-24: 60 mg via ORAL
  Filled 2017-01-24: qty 3

## 2017-01-24 NOTE — ED Provider Notes (Signed)
[facility] Emergency Department Provider Note  ____________________________________________  Time seen: Approximately 2:57 AM  I have reviewed the triage vital signs and the nursing notes.   HISTORY  Chief Complaint "Lump in throat"   HPI Angela Cisneros is a 46 y.o. female with a history of weight controlled diabetes, thyroid disease s/p thyroidectomy for goiter removal 8 years ago, HTN who presents for evaluation of throat pain. Patient reports that she woke up 2 days ago with a sensation of a lump in the left side of her throat. She reports that she has pain every time she turns her neck or swallows. Today her voice started to change and become hoarse. No fever, congestion, cough, reflux, chest pain, shortness of breath, neck stiffness, difficulty swallowing, trouble breathing. Patient reports constant nonradiating moderate pain at this time.  Past Medical History:  Diagnosis Date  . Diabetes mellitus without complication ([place])    weight controlled  . Fibroid, uterine   . Headache(784.0)   . Herpes   . Hypertension   . Hypothyroidism     Patient Active Problem List   Diagnosis Date Noted  . Thyroid disease 10/20/2016  . ANA positive 10/18/2016  . Essential hypertension 10/18/2016  . Type 2 diabetes mellitus ([place]) 10/18/2016  . Other fatigue 10/18/2016  . Hair loss (thinning hair) 10/18/2016  . Rectal bleeding 07/21/2016  . Chronic diarrhea 07/21/2016  . Diarrhea 07/14/2016  . Bloating 07/14/2016  . Myalgia 06/17/2016  . Bilateral primary osteoarthritis of knee 06/17/2016  . S/P panniculectomy 11/18/2014  . Abdominal pain 01/08/2009  . NAUSEA WITH VOMITING 01/08/2009  . TROCHANTERIC BURSITIS, RIGHT 12/12/2008  . ALLERGIC REACTION 10/11/2008    Past Surgical History:  Procedure Laterality Date  . CHOLECYSTECTOMY    . COLONOSCOPY    . KNEE SURGERY    . LIPOSUCTION N/A 11/18/2014   Procedure: LIPOSUCTION;  Surgeon: Cristine Polio, MD;  Location: [place];  Service: Plastics;  Laterality: N/A;  . PANNICULECTOMY  11/18/2014   with repair / diastasis recti  . PANNICULECTOMY N/A 11/18/2014   Procedure: PANNICULECTOMY WITH REPAIR OF DIASTASIS RECTI ;  Surgeon: Cristine Polio, MD;  Location: [place];  Service: Plastics;  Laterality: N/A;  . POLYPECTOMY    . THYROID SURGERY    . TUBAL LIGATION    . VAGINAL HYSTERECTOMY      Prior to Admission medications   Medication Sig Start Date End Date Taking? Authorizing Provider  cholestyramine (QUESTRAN) 4 g packet Take 4 g by mouth 2 (two) times daily.    [provider]  diclofenac sodium (VOLTAREN) 1 % GEL Apply 4 g topically 4 (four) times daily. 10/21/16   Bo Merino, MD  losartan-hydrochlorothiazide (HYZAAR) 100-12.5 MG tablet Take 1 tablet by mouth daily. 10/20/16   Golden Circle, FNP  metFORMIN (GLUCOPHAGE XR) 500 MG 24 hr tablet Take 1 tablet (500 mg total) by mouth daily with breakfast. Patient not taking: Reported on 11/11/2016 10/20/16   Golden Circle, FNP  predniSONE (DELTASONE) 20 MG tablet Take 3 tablets (60 mg total) by mouth daily. 01/24/17 01/28/17  Rudene Re, MD  valACYclovir (VALTREX) 500 MG tablet Take 500 mg by mouth 2 (two) times daily.    [provider]    Allergies Corn starch  Family History  Problem Relation Age of Onset  . Hypertension Other   . Diabetes Other   . Hypertension Mother   . Diabetes Mother   . Allergic rhinitis Son   .  Asthma Son   . Eczema Son   . Allergic rhinitis Daughter   . Asthma Daughter   . Eczema Daughter   . Colon cancer Cousin        mat cousins father  . Anesthesia problems Neg Hx   . Hypotension Neg Hx   . Malignant hyperthermia Neg Hx   . Pseudochol deficiency Neg Hx   . Angioedema Neg Hx   . Atopy Neg Hx   . Immunodeficiency Neg Hx   . Urticaria Neg Hx     Social History Social History  Substance Use Topics  . Smoking status: Never Smoker  . Smokeless  tobacco: Never Used  . Alcohol use Yes     Comment: Occasionally    Review of Systems  Constitutional: Negative for fever. Eyes: Negative for visual changes. ENT: + throat pain Neck: No neck pain  Cardiovascular: Negative for chest pain. Respiratory: Negative for shortness of breath. Gastrointestinal: Negative for abdominal pain, vomiting or diarrhea. Genitourinary: Negative for dysuria. Musculoskeletal: Negative for back pain. Skin: Negative for rash. Neurological: Negative for headaches, weakness or numbness. Psych: No SI or HI  ____________________________________________   PHYSICAL EXAM:  VITAL SIGNS: ED Triage Vitals [01/23/17 2140]  Enc Vitals Group     BP (!) 147/86     Pulse Rate 73     Resp 20     Temp 98.2 F (36.8 C)     Temp Source Oral     SpO2 97 %     Weight 200 lb (90.7 kg)     Height 5\' 3"  (1.6 m)     Head Circumference      Peak Flow      Pain Score      Pain Loc      Pain Edu?      Excl. in [place]?     Constitutional: Alert and oriented. Well appearing and in no apparent distress. HEENT:      Head: Normocephalic and atraumatic.         Eyes: Conjunctivae are normal. Sclera is non-icteric. EOMI. PERRL      Ears: Tympanic membranes visualized and clear. No discharge or pus present.      Nose: No congestion. Sinuses are non tender.      Mouth/Throat: Mucous membranes are moist. Oropharynx with no exudates or erythema. No masses, uvula is midline      Neck: Supple with no signs of meningismus. No stridor. No masses palpable Hematological/Lymphatic/Immunilogical: No cervical lymphadenopathy. Cardiovascular: Regular rate and rhythm. No murmurs, gallops, or rubs. 2+ symmetrical distal pulses are present in all extremities. No JVD. Respiratory: Normal respiratory effort. Lungs are clear to auscultation bilaterally. No wheezes, crackles, or rhonchi.  Gastrointestinal: Soft, non tender, and non distended with positive bowel sounds. No rebound or  guarding. Musculoskeletal: Nontender with normal range of motion in all extremities. No edema, cyanosis, or erythema of extremities. Neurologic: Normal speech and language. Face is symmetric. Moving all extremities. No gross focal neurologic deficits are appreciated. Skin: Skin is warm, dry and intact. No rash noted. Psychiatric: Mood and affect are normal. Speech and behavior are normal.  ____________________________________________   LABS (all labs ordered are listed, but only abnormal results are displayed)  Labs Reviewed  CULTURE, GROUP A STREP [facility])  POCT RAPID STREP A   ____________________________________________  EKG  none  ____________________________________________  RADIOLOGY  XR neck: Negative  ____________________________________________   PROCEDURES  Procedure(s) performed: None Procedures Critical Care performed:  None ____________________________________________   INITIAL  IMPRESSION / ASSESSMENT AND PLAN / ED COURSE  46 y.o. female with a history of weight controlled diabetes, thyroid disease s/p thyroidectomy, HTN who presents for evaluation of throat pain and lump on the left side. Patient is extremely well appearing, no distress, has normal vital signs, oropharynx is clear, no meningeal signs, no masses palpable on her neck on exam, no cervical lymphadenopathy. Airway is patent. No respiratory distress. Handling her saliva. Will do strep swab and neck XR. Will give prednisone and GI cocktail for symptoms relief. If imaging and strep negative will refer patient back to [facility]NT for further evaluation.  Clinical Course as of Jan 25 451  Mon Jan 24, 2017  5686 Patient sleeping comfortably. Feels that her pain was fully resolved after GI cocktail. I will discharge her home. She she is a patient of [facility] [place]. I recommended that she follows up with them on Monday for symptoms are not resolved. Also recommended that she return to the  emergency room if she is having trouble swallowing or speaking or breathing. Patient agrees with the plan  [CV]    [place], [place], MD    Pertinent labs & imaging results that were available during my care of the patient were reviewed by me and considered in my medical decision making (see chart for details).    ____________________________________________   FINAL CLINICAL IMPRESSION(S) / ED DIAGNOSES  Final diagnoses:  Throat pain in adult      NEW MEDICATIONS STARTED DURING THIS VISIT:  New Prescriptions   PREDNISONE (DELTASONE) 20 MG TABLET    Take 3 tablets (60 mg total) by mouth daily.     Note:  This document was prepared using Dragon voice recognition software and may include unintentional dictation errors.    Alfred Levins, [place], MD 01/24/17 [phone]

## 2017-01-26 LAB — CULTURE, GROUP A STREP (THRC)

## 2017-02-16 ENCOUNTER — Telehealth: Payer: Self-pay | Admitting: Gastroenterology

## 2017-02-17 ENCOUNTER — Other Ambulatory Visit: Payer: Self-pay | Admitting: *Deleted

## 2017-02-17 MED ORDER — CHOLESTYRAMINE 4 G PO PACK: 4.0000 g | PACK | Freq: Two times a day (BID) | ORAL | 1 refills | Status: AC

## 2017-02-17 NOTE — Telephone Encounter (Signed)
Called and left a message for the patient advising that Alonza Bogus PA would refill Questran for one month. Janett Billow advises the patient needs to make an appointment to see Dr. Silverio Decamp.  Sent refill to patient's pharmacy.

## 2017-02-18 ENCOUNTER — Ambulatory Visit: Payer: BLUE CROSS/BLUE SHIELD | Admitting: Gastroenterology

## 2017-04-05 ENCOUNTER — Telehealth: Payer: Self-pay | Admitting: Gastroenterology

## 2017-04-05 NOTE — Telephone Encounter (Signed)
Left message on machine to call back  

## 2017-04-06 NOTE — Telephone Encounter (Signed)
Left message on machine to call back will wait for further communication from the pt  

## 2017-04-07 ENCOUNTER — Telehealth: Payer: Self-pay | Admitting: Gastroenterology

## 2017-04-07 NOTE — Telephone Encounter (Signed)
Patient complains of loose stools in excess of 3 times daily, nausea and feels "weak." She is on Cholestyramine BID. No recent atb's. She has an appointment with Tye Savoy NP next week. Trying to maintain hydration. Should she have labs before her appointment?

## 2017-04-07 NOTE — Telephone Encounter (Signed)
Please check C.diff and BMP. Thanks

## 2017-04-08 ENCOUNTER — Other Ambulatory Visit: Payer: Self-pay | Admitting: Gastroenterology

## 2017-04-08 ENCOUNTER — Other Ambulatory Visit (INDEPENDENT_AMBULATORY_CARE_PROVIDER_SITE_OTHER): Payer: BLUE CROSS/BLUE SHIELD

## 2017-04-08 DIAGNOSIS — R197 Diarrhea, unspecified: Secondary | ICD-10-CM

## 2017-04-08 LAB — BASIC METABOLIC PANEL
BUN: 12 mg/dL (ref 6–23)
CALCIUM: 9 mg/dL (ref 8.4–10.5)
CO2: 23 mEq/L (ref 19–32)
Chloride: 99 mEq/L (ref 96–112)
Creatinine, Ser: 0.9 mg/dL (ref 0.40–1.20)
GFR: 87.47 mL/min (ref >60.00)
Glucose, Bld: 387 mg/dL — ABNORMAL HIGH (ref 70–99)
POTASSIUM: 4 meq/L (ref 3.5–5.1)
SODIUM: 133 meq/L — AB (ref 135–145)

## 2017-04-08 NOTE — Telephone Encounter (Signed)
Left message on machine to call back  

## 2017-04-08 NOTE — Telephone Encounter (Signed)
Patient is aware to come for labs.

## 2017-04-11 ENCOUNTER — Ambulatory Visit (INDEPENDENT_AMBULATORY_CARE_PROVIDER_SITE_OTHER): Payer: BLUE CROSS/BLUE SHIELD | Admitting: Family

## 2017-04-11 ENCOUNTER — Encounter: Payer: Self-pay | Admitting: Family

## 2017-04-11 VITALS — BP 124/82 | HR 71 | Temp 98.2°F | Resp 16 | Ht 63.0 in | Wt 199.1 lb

## 2017-04-11 DIAGNOSIS — L659 Nonscarring hair loss, unspecified: Secondary | ICD-10-CM | POA: Diagnosis not present

## 2017-04-11 DIAGNOSIS — E119 Type 2 diabetes mellitus without complications: Secondary | ICD-10-CM

## 2017-04-11 DIAGNOSIS — R221 Localized swelling, mass and lump, neck: Secondary | ICD-10-CM | POA: Insufficient documentation

## 2017-04-11 NOTE — Patient Instructions (Addendum)
Thank you for choosing [facility].  SUMMARY AND INSTRUCTIONS:  Please start taking the omeprazole daily.  Start taking Jardiance daily. (Check the price of Lovie Macadamia or Invokana).  If your symptoms worsen, please follow up with ENT.   We will check with Dr. Silverio Decamp regarding urine test.   Medication:  Your prescription(s) have been submitted to your pharmacy or been printed and provided for you. Please take as directed and contact our office if you believe you are having problem(s) with the medication(s) or have any questions.  Follow up:  If your symptoms worsen or fail to improve, please contact our office for further instruction, or in case of emergency go directly to the emergency room at the closest medical facility.

## 2017-04-11 NOTE — Assessment & Plan Note (Signed)
No significant findings on exam. Previous treatment and improvement with GI cocktail. Symptoms are consistent with GERD. Start omeprazole. Recommend follow up with ENT if symptoms worsen or do not improve.

## 2017-04-11 NOTE — Assessment & Plan Note (Signed)
Hair loss of undetermined origin that is refractory to Biotin supplementation. Thyroid reported normal. Possible urine test for metals as a potential cause. Will check on heavy metal profile for urine. Follow up pending results.

## 2017-04-11 NOTE — Assessment & Plan Note (Signed)
Reports metformin resulting in abdominal distress. Discontinue metformin. Start sample of Jardiance. Check price of SGLT-2. Monitor blood sugars at home. Follow up pending formulary check.

## 2017-04-11 NOTE — Progress Notes (Signed)
Subjective:    Patient ID: Angela Cisneros, female    DOB: 1970/11/01, 46 y.o.   MRN: [id number]  Chief Complaint  Patient presents with  . Follow-up    24 hour urine for metal, lump in throat     HPI:  Angela Cisneros is a 46 y.o. female who  has a past medical history of Diabetes mellitus without complication ([place]); Fibroid, uterine; Headache(784.0); Herpes; Hypertension; and Hypothyroidism. and presents today for a follow up office visit.   1.) Lump in throat - Associated symptom of a lump located in her throat has been going waxing and waning for about 4 days. She was previously seen in the ED at [facility] for similar problems and was treated with a GI cocktail that improved her symptoms. Describes her neck as stiff at times. No difficulties with swallowing or breathing. Course of the symptoms appears to be worse.   2.) Hair falling out - Continues to experience the associated symptom of hair falling out. Her previous TSH was normal. Describes eating a regular diet with adequate amount of protein. Refractory to previous treatment with Biotin supplementation. Recommended testing of metals in urine to result out possible cause.   3.) Diabetes - Currently prescribed metformin. Reports she has stopped taking the medication secondary to the adverse side effects of gastrointestinal distress. Does not currently check her blood sugars at home. Requesting change of metformin.   Allergies  Allergen Reactions  . Corn Starch Hives and Itching    Powder in gloves      Outpatient Medications Prior to Visit  Medication Sig Dispense Refill  . cholestyramine (QUESTRAN) 4 g packet Take 1 packet (4 g total) by mouth 2 (two) times daily. 60 each 1  . diclofenac sodium (VOLTAREN) 1 % GEL Apply 4 g topically 4 (four) times daily. 5 Tube 1  . losartan-hydrochlorothiazide (HYZAAR) 100-12.5 MG tablet Take 1 tablet by mouth daily. 90 tablet 1  . valACYclovir (VALTREX) 500 MG tablet Take 500 mg by  mouth 2 (two) times daily.    . metFORMIN (GLUCOPHAGE XR) 500 MG 24 hr tablet Take 1 tablet (500 mg total) by mouth daily with breakfast. (Patient not taking: Reported on 04/11/2017) 30 tablet 2   No facility-administered medications prior to visit.       Past Surgical History:  Procedure Laterality Date  . CHOLECYSTECTOMY    . COLONOSCOPY    . KNEE SURGERY    . LIPOSUCTION N/A 11/18/2014   Procedure: LIPOSUCTION;  Surgeon: Cristine Polio, MD;  Location: [place];  Service: Plastics;  Laterality: N/A;  . PANNICULECTOMY  11/18/2014   with repair / diastasis recti  . PANNICULECTOMY N/A 11/18/2014   Procedure: PANNICULECTOMY WITH REPAIR OF DIASTASIS RECTI ;  Surgeon: Cristine Polio, MD;  Location: [place];  Service: Plastics;  Laterality: N/A;  . POLYPECTOMY    . THYROID SURGERY    . TUBAL LIGATION    . VAGINAL HYSTERECTOMY        Past Medical History:  Diagnosis Date  . Diabetes mellitus without complication ([place])    weight controlled  . Fibroid, uterine   . Headache(784.0)   . Herpes   . Hypertension   . Hypothyroidism       Review of Systems  Constitutional: Negative for chills and fever.  HENT: Negative for sore throat.        Positive for lump in throat.   Respiratory: Negative for chest tightness and shortness of breath.   Cardiovascular:  Negative for chest pain, palpitations and leg swelling.  Skin:       Positive for hair loss.       Objective:    BP 124/82 (BP Location: Left Arm, Patient Position: Sitting, Cuff Size: Large)   Pulse 71   Temp 98.2 F (36.8 C) (Oral)   Resp 16   Ht 5\' 3"  (1.6 m)   Wt 199 lb 1.9 oz (90.3 kg)   SpO2 97%   BMI 35.27 kg/m  Nursing note and vital signs reviewed.  Physical Exam  Constitutional: She is oriented to person, place, and time. She appears well-developed and well-nourished. No distress.  HENT:  Right Ear: Hearing, tympanic membrane, external ear and ear canal normal.  Left Ear: Hearing, tympanic membrane,  external ear and ear canal normal.  Nose: Nose normal.  Mouth/Throat: Uvula is midline, oropharynx is clear and moist and mucous membranes are normal.  Neck: Neck supple.  Cardiovascular: Normal rate, regular rhythm, normal heart sounds and intact distal pulses.   Pulmonary/Chest: Effort normal and breath sounds normal.  Lymphadenopathy:    She has no cervical adenopathy.  Neurological: She is alert and oriented to person, place, and time.  Skin: Skin is warm and dry.  Psychiatric: She has a normal mood and affect. Her behavior is normal. Judgment and thought content normal.       Assessment & Plan:   Problem List Items Addressed This Visit      Endocrine   Type 2 diabetes mellitus ([place]) - Primary    Reports metformin resulting in abdominal distress. Discontinue metformin. Start sample of Jardiance. Check price of SGLT-2. Monitor blood sugars at home. Follow up pending formulary check.         Other   Hair loss (thinning hair)    Hair loss of undetermined origin that is refractory to Biotin supplementation. Thyroid reported normal. Possible urine test for metals as a potential cause. Will check on heavy metal profile for urine. Follow up pending results.       Relevant Orders   Heavy Metals Profile, Urine   Lump in throat    No significant findings on exam. Previous treatment and improvement with GI cocktail. Symptoms are consistent with GERD. Start omeprazole. Recommend follow up with ENT if symptoms worsen or do not improve.           I am having Ms. Pam maintain her losartan-hydrochlorothiazide, metFORMIN, valACYclovir, diclofenac sodium, and cholestyramine.   Follow-up: Return in about 1 month (around 05/11/2017), or if symptoms worsen or fail to improve.  Mauricio Po, FNP

## 2017-04-13 NOTE — Progress Notes (Deleted)
Office Visit Note  Patient: Angela Cisneros             Date of Birth: 02/07/1971           MRN: [id number]             PCP: Golden Circle, FNP Referring: Golden Circle, FNP Visit Date: 04/21/2017 Occupation: @GUAROCC @    Subjective:  No chief complaint on file.   History of Present Illness: Angela Cisneros is a 46 y.o. female ***   Activities of Daily Living:  Patient reports morning stiffness for *** {minute/hour:19697}.   Patient {ACTIONS;DENIES/REPORTS:21021675::"Denies"} nocturnal pain.  Difficulty dressing/grooming: {ACTIONS;DENIES/REPORTS:21021675::"Denies"} Difficulty climbing stairs: {ACTIONS;DENIES/REPORTS:21021675::"Denies"} Difficulty getting out of chair: {ACTIONS;DENIES/REPORTS:21021675::"Denies"} Difficulty using hands for taps, buttons, cutlery, and/or writing: {ACTIONS;DENIES/REPORTS:21021675::"Denies"}   No Rheumatology ROS completed.   PMFS History:  Patient Active Problem List   Diagnosis Date Noted  . Lump in throat 04/11/2017  . Thyroid disease 10/20/2016  . ANA positive 10/18/2016  . Essential hypertension 10/18/2016  . Type 2 diabetes mellitus ([place]) 10/18/2016  . Other fatigue 10/18/2016  . Hair loss (thinning hair) 10/18/2016  . Rectal bleeding 07/21/2016  . Chronic diarrhea 07/21/2016  . Diarrhea 07/14/2016  . Bloating 07/14/2016  . Myalgia 06/17/2016  . Bilateral primary osteoarthritis of knee 06/17/2016  . S/P panniculectomy 11/18/2014  . Abdominal pain 01/08/2009  . NAUSEA WITH VOMITING 01/08/2009  . TROCHANTERIC BURSITIS, RIGHT 12/12/2008  . ALLERGIC REACTION 10/11/2008    Past Medical History:  Diagnosis Date  . Diabetes mellitus without complication ([place])    weight controlled  . Fibroid, uterine   . Headache(784.0)   . Herpes   . Hypertension   . Hypothyroidism     Family History  Problem Relation Age of Onset  . Hypertension Other   . Diabetes Other   . Hypertension Mother   . Diabetes Mother   .  Allergic rhinitis Son   . Asthma Son   . Eczema Son   . Allergic rhinitis Daughter   . Asthma Daughter   . Eczema Daughter   . Colon cancer Cousin        mat cousins father  . Anesthesia problems Neg Hx   . Hypotension Neg Hx   . Malignant hyperthermia Neg Hx   . Pseudochol deficiency Neg Hx   . Angioedema Neg Hx   . Atopy Neg Hx   . Immunodeficiency Neg Hx   . Urticaria Neg Hx    Past Surgical History:  Procedure Laterality Date  . CHOLECYSTECTOMY    . COLONOSCOPY    . KNEE SURGERY    . LIPOSUCTION N/A 11/18/2014   Procedure: LIPOSUCTION;  Surgeon: Cristine Polio, MD;  Location: [facility];  Service: Plastics;  Laterality: N/A;  . PANNICULECTOMY  11/18/2014   with repair / diastasis recti  . PANNICULECTOMY N/A 11/18/2014   Procedure: PANNICULECTOMY WITH REPAIR OF DIASTASIS RECTI ;  Surgeon: Cristine Polio, MD;  Location: [facility];  Service: Plastics;  Laterality: N/A;  . POLYPECTOMY    . THYROID SURGERY    . TUBAL LIGATION    . VAGINAL HYSTERECTOMY     Social History   Social History Narrative   Fun/Hobby: No hobbies     Objective: Vital Signs: There were no vitals taken for this visit.   Physical Exam   Musculoskeletal Exam: ***  CDAI Exam: No CDAI exam completed.    Investigation: No additional findings. CBC Latest Ref Rng & Units 10/21/2016 07/01/2016 11/19/2014  WBC  3.8 - 10.8 K/uL 6.2 5.1 7.2  Hemoglobin 11.7 - 15.5 g/dL 14.0 14.5 12.3  Hematocrit 35.0 - 45.0 % 41.8 42.5 36.9  Platelets 140 - 400 K/uL 261 218.0 209   CMP Latest Ref Rng & Units 04/08/2017 10/20/2016 07/01/2016  Glucose 70 - 99 mg/dL 387(H) 209(H) 201(H)  BUN 6 - 23 mg/dL 12 11 11   Creatinine 0.40 - 1.20 mg/dL 0.90 0.73 0.83  Sodium 135 - 145 mEq/L 133(L) 134(L) 139  Potassium 3.5 - 5.1 mEq/L 4.0 4.4 4.4  Chloride 96 - 112 mEq/L 99 103 103  CO2 19 - 32 mEq/L 23 26 29   Calcium 8.4 - 10.5 mg/dL 9.0 9.2 8.6  Total Protein 6.0 - 8.3 g/dL - 6.7 -  Total Bilirubin 0.2 - 1.2 mg/dL - 0.3 -    Alkaline Phos 39 - 117 U/L - 83 -  AST 0 - 37 U/L - 12 -  ALT 0 - 35 U/L - 15 -    Imaging: No results found.  Speciality Comments: No specialty comments available.    Procedures:  No procedures performed Allergies: Corn starch   Assessment / Plan:     Visit Diagnoses: No diagnosis found.    Orders: No orders of the defined types were placed in this encounter.  No orders of the defined types were placed in this encounter.   Face-to-face time spent with patient was *** minutes. 50% of time was spent in counseling and coordination of care.  Follow-Up Instructions: No Follow-up on file.   Earnestine Mealing, NT  Note - This record has been created using Editor, commissioning.  Chart creation errors have been sought, but may not always  have been located. Such creation errors do not reflect on  the standard of medical care.

## 2017-04-20 ENCOUNTER — Ambulatory Visit (INDEPENDENT_AMBULATORY_CARE_PROVIDER_SITE_OTHER): Payer: BLUE CROSS/BLUE SHIELD | Admitting: Nurse Practitioner

## 2017-04-20 ENCOUNTER — Encounter: Payer: Self-pay | Admitting: Nurse Practitioner

## 2017-04-20 ENCOUNTER — Telehealth: Payer: Self-pay | Admitting: Gastroenterology

## 2017-04-20 VITALS — BP 116/68 | Ht 63.0 in | Wt 195.0 lb

## 2017-04-20 DIAGNOSIS — K529 Noninfective gastroenteritis and colitis, unspecified: Secondary | ICD-10-CM

## 2017-04-20 MED ORDER — DICYCLOMINE HCL 20 MG PO TABS: 20.0000 mg | ORAL_TABLET | Freq: Two times a day (BID) | ORAL | 0 refills | Status: DC

## 2017-04-20 NOTE — Patient Instructions (Signed)
If you are age 44 or older, your body mass index should be between 23-30. Your Body mass index is 34.54 kg/m. If this is out of the aforementioned range listed, please consider follow up with your Primary Care Provider.  If you are age 10 or younger, your body mass index should be between 19-25. Your Body mass index is 34.54 kg/m. If this is out of the aformentioned range listed, please consider follow up with your Primary Care Provider.   We have sent the following medications to your pharmacy for you to pick up at your convenience: Bentyl  20 mg  Call with an update in 7-10 days.  Thank you for choosing me and [facility].   Tye Savoy, NP

## 2017-04-20 NOTE — Progress Notes (Signed)
     HPI: Patient is a 46 yo female followed here for chronic diarrhea. Her TSH normal is normal.  Celiac labs negative. Colonoscopy with normal random biopsies in Feb 2018 was unremarkable. She takes Questran BID and initially found it helpful. She was seen in April with an exacerbation of chronic diarrhea. Fecal elastase was normal. Imodium as needed was added to her Questran. Patient has been difficult to manage.  Over the last several weeks it is like she isn't even taking the [place]. She has cramps and urgent loose stool after any PO intake regardless of what she eats or drinks. The first movement starts with a hard piece of stool and remainder of stools are ll loose, often with mucous. 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 She has been on disability for the diarrhea.  No fevers or unintentional weight loss. No antibiotics in last 2-3 months. She did restart Glucophage 3 months ago after being off of it for 5 years. Patient adamant that diarrhea is NOT from Metformin because she has associated grumbling,cramps, urgency which has never been the case with metformin. She is asking that her FMLA papers be updated.    Past Medical History:  Diagnosis Date  . Diabetes mellitus without complication ([place])    weight controlled  . Fibroid, uterine   . Headache(784.0)   . Herpes   . Hypertension   . Hypothyroidism     Patient's surgical history, family medical history, social history, medications and allergies were all reviewed in Epic    Physical Exam: BP 116/68   Ht 5\' 3"  (1.6 m)   Wt 195 lb (88.5 kg)   BMI 34.54 kg/m   GENERAL: black female in NAD PSYCH: :Pleasant, cooperative, normal affect EENT:  conjunctiva pink, mucous membranes moist, neck supple without masses CARDIAC:  RRR, no murmur heard, no peripheral edema PULM: Normal respiratory effort, lungs CTA bilaterally, no wheezing ABDOMEN:  soft, nontender, nondistended, no obvious masses, no  hepatomegaly,  normal bowel sounds SKIN:  turgor, no lesions seen Musculoskeletal:  Normal muscle tone, normal strength NEURO: Alert and oriented x 3, no focal neurologic deficits  ASSESSMENT and PLAN:  Pleasant 46 year old female with urgent, crampy diarrhea which she feels renders her unable to work (bus driver)  -continue Surveyor, minerals.   -add bentyl 20 mg bid to help with cramps and slow down gastrocolic reflex. -call in 7-10 days with update. If not better then check stool studies and treat empirically with course of Xifaxan for possible SIB0 -If still no improvement then consider lotronex especially given the severity of her symptoms -she wants FMLA papers updated. I haven't received any papers    Tye Savoy , NP 04/20/2017, 10:23 AM

## 2017-04-20 NOTE — Telephone Encounter (Signed)
The pt has been advised that the records are not here in this office. She was advised to call medial records and discuss.

## 2017-04-21 ENCOUNTER — Ambulatory Visit: Payer: BLUE CROSS/BLUE SHIELD | Admitting: Rheumatology

## 2017-04-22 ENCOUNTER — Ambulatory Visit: Payer: BLUE CROSS/BLUE SHIELD | Attending: Otolaryngology

## 2017-04-22 DIAGNOSIS — E669 Obesity, unspecified: Secondary | ICD-10-CM | POA: Insufficient documentation

## 2017-04-22 DIAGNOSIS — G4710 Hypersomnia, unspecified: Secondary | ICD-10-CM | POA: Diagnosis present

## 2017-04-22 DIAGNOSIS — R51 Headache: Secondary | ICD-10-CM | POA: Insufficient documentation

## 2017-04-22 DIAGNOSIS — F5101 Primary insomnia: Secondary | ICD-10-CM | POA: Insufficient documentation

## 2017-04-22 DIAGNOSIS — G478 Other sleep disorders: Secondary | ICD-10-CM | POA: Insufficient documentation

## 2017-04-22 DIAGNOSIS — G4700 Insomnia, unspecified: Secondary | ICD-10-CM | POA: Insufficient documentation

## 2017-04-22 DIAGNOSIS — R0683 Snoring: Secondary | ICD-10-CM | POA: Insufficient documentation

## 2017-04-22 DIAGNOSIS — G4733 Obstructive sleep apnea (adult) (pediatric): Secondary | ICD-10-CM | POA: Diagnosis not present

## 2017-04-24 NOTE — Progress Notes (Signed)
Reviewed and agree with documentation and assessment and plan. K. Veena Cainen Burnham , MD   

## 2017-04-25 DIAGNOSIS — Z8639 Personal history of other endocrine, nutritional and metabolic disease: Secondary | ICD-10-CM | POA: Insufficient documentation

## 2017-04-25 DIAGNOSIS — Z8719 Personal history of other diseases of the digestive system: Secondary | ICD-10-CM | POA: Insufficient documentation

## 2017-04-25 DIAGNOSIS — M2241 Chondromalacia patellae, right knee: Secondary | ICD-10-CM | POA: Insufficient documentation

## 2017-04-25 DIAGNOSIS — R768 Other specified abnormal immunological findings in serum: Secondary | ICD-10-CM | POA: Insufficient documentation

## 2017-04-25 DIAGNOSIS — Z8679 Personal history of other diseases of the circulatory system: Secondary | ICD-10-CM | POA: Insufficient documentation

## 2017-04-25 NOTE — Progress Notes (Signed)
Office Visit Note  Patient: Angela Cisneros             Date of Birth: 07/14/1971           MRN: [id number]             PCP: Golden Circle, FNP Referring: Golden Circle, FNP Visit Date: 04/26/2017 Occupation: @GUAROCC @    Subjective:  Pain of the Left Hip (Has pain in left hip bursa area )   History of Present Illness: Angela Cisneros is a 46 y.o. female with history of osteoarthritis and positive serology. She states she's been having pain and discomfort in her left hip. She's having difficulty walking and climbing steps. She has occasional discomfort in her hands but none of the other joints are painful. She denies any history of joint swelling. She continues to have some fatigue and hair loss. She states her vitamin D was low at her GYNs office and she was given a prescription for vitamin D again.  Activities of Daily Living:  Patient reports morning stiffness for 5 hour.   Patient Denies nocturnal pain.  Difficulty dressing/grooming: Denies Difficulty climbing stairs: Denies Difficulty getting out of chair: Denies Difficulty using hands for taps, buttons, cutlery, and/or writing: Denies   Review of Systems  Constitutional: Negative.  Negative for fatigue, night sweats, weight gain, weight loss and weakness.  HENT: Negative.  Negative for mouth sores, trouble swallowing, trouble swallowing, mouth dryness and nose dryness.   Eyes: Negative.  Negative for pain, redness, visual disturbance and dryness.  Respiratory: Negative.  Negative for cough, shortness of breath and difficulty breathing.   Cardiovascular: Negative.  Negative for chest pain, palpitations, hypertension, irregular heartbeat and swelling in legs/feet.  Gastrointestinal: Negative.  Negative for blood in stool, constipation and diarrhea.  Endocrine: Negative.  Negative for increased urination.  Genitourinary: Negative for vaginal dryness.  Musculoskeletal: Positive for arthralgias, gait problem,  joint pain and morning stiffness. Negative for joint swelling, myalgias, muscle weakness, muscle tenderness and myalgias.  Skin: Negative.  Negative for color change, rash, hair loss, skin tightness, ulcers and sensitivity to sunlight.  Allergic/Immunologic: Negative.  Negative for susceptible to infections.  Neurological: Negative for dizziness, memory loss and night sweats.       Negative  Hematological: Negative.  Negative for swollen glands.  Psychiatric/Behavioral: Positive for sleep disturbance. Negative for depressed mood. The patient is not nervous/anxious.     PMFS History:  Patient Active Problem List   Diagnosis Date Noted  . 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 Positive RNP antibody 04/25/2017  . History of hypertension 04/25/2017  . History of diabetes mellitus 04/25/2017  . History of gluten intolerance 04/25/2017  . Lump in throat 04/11/2017  . Thyroid disease 10/20/2016  . ANA positive 10/18/2016  . Essential hypertension 10/18/2016  . Type 2 diabetes mellitus ([place]) 10/18/2016  . Other fatigue 10/18/2016  . Hair thinning 10/18/2016  . Rectal bleeding 07/21/2016  . Chronic diarrhea 07/21/2016  . Diarrhea 07/14/2016  . Bloating 07/14/2016  . Myalgia 06/17/2016  . Primary osteoarthritis of both knees 06/17/2016  . S/P panniculectomy 11/18/2014  . Abdominal pain 01/08/2009  . NAUSEA WITH VOMITING 01/08/2009  . TROCHANTERIC BURSITIS, RIGHT 12/12/2008  . ALLERGIC REACTION 10/11/2008    Past Medical History:  Diagnosis Date  . Diabetes mellitus without complication ([place])    weight controlled  . Fibroid, uterine   . Headache(784.0)   . Herpes   . Hypertension   . Hypothyroidism  Family History  Problem Relation Age of Onset  . Hypertension Other   . Diabetes Other   . Hypertension Mother   . Diabetes Mother   . Allergic rhinitis Son   . Asthma Son   . Eczema Son   . Allergic rhinitis Daughter   . Asthma Daughter   . Eczema Daughter   .  Colon cancer Cousin        mat cousins father  . Anesthesia problems Neg Hx   . Hypotension Neg Hx   . Malignant hyperthermia Neg Hx   . Pseudochol deficiency Neg Hx   . Angioedema Neg Hx   . Atopy Neg Hx   . Immunodeficiency Neg Hx   . Urticaria Neg Hx    Past Surgical History:  Procedure Laterality Date  . CHOLECYSTECTOMY    . COLONOSCOPY    . KNEE SURGERY    . LIPOSUCTION N/A 11/18/2014   Procedure: LIPOSUCTION;  Surgeon: Cristine Polio, MD;  Location: [facility];  Service: Plastics;  Laterality: N/A;  . PANNICULECTOMY  11/18/2014   with repair / diastasis recti  . PANNICULECTOMY N/A 11/18/2014   Procedure: PANNICULECTOMY WITH REPAIR OF DIASTASIS RECTI ;  Surgeon: Cristine Polio, MD;  Location: [facility];  Service: Plastics;  Laterality: N/A;  . POLYPECTOMY    . THYROID SURGERY    . TUBAL LIGATION    . VAGINAL HYSTERECTOMY     Social History   Social History Narrative   Fun/Hobby: No hobbies     Objective: Vital Signs: BP 124/76   Pulse 78   Resp 16   Ht 5\' 3"  (1.6 m)   Wt 198 lb (89.8 kg)   BMI 35.07 kg/m    Physical Exam  Constitutional: She is oriented to person, place, and time. She appears well-developed and well-nourished.  HENT:  Head: Normocephalic and atraumatic.  Eyes: Conjunctivae and EOM are normal.  Neck: Normal range of motion.  Cardiovascular: Normal rate, regular rhythm, normal heart sounds and intact distal pulses.   Pulmonary/Chest: Effort normal and breath sounds normal.  Abdominal: Soft. Bowel sounds are normal.  Lymphadenopathy:    She has no cervical adenopathy.  Neurological: She is alert and oriented to person, place, and time.  Skin: Skin is warm and dry. Capillary refill takes less than 2 seconds.  Psychiatric: She has a normal mood and affect. Her behavior is normal.  Nursing note and vitals reviewed.    Musculoskeletal Exam: C-spine, thoracic, lumbar spine good range of motion. Shoulder joints elbow joints wrist joints are good range  of motion. She has no synovitis over MCPs PIPs or DIPs. She is painful range of motion of her left hip joint she also had tenderness over left trochanteric bursa. Knee joints ankles MTPs PIPs with good range of motion with no synovitis.  CDAI Exam: No CDAI exam completed.    Investigation: Findings:  01/05/16 Negative ANA, positive RNP.  She gives a history of fatigue and hair thinning but no synovitis on the examination.  At this point she does not meet the criteria for autoimmune disease.    In March 2017 her BMP showed glucose of 207.  LH and FSH were normal.  Hemoglobin A1c was 9.8% and uric acid 5.0.  ANA was positive, but no titer was given.  Her Tamala Julian, Scl-70, Ro, La, Jo, and centromere antibodies were negative.  RNP was low-titer positive.   12/03/2015 I did obtain x-rays of bilateral knee joints, 2 views, today as she has been having  ongoing pain and discomfort.  It showed bilateral moderate medial compartment narrowing, bilateral moderate patellofemoral narrowing without any chondrocalcinosis consistent with osteoarthritis and chondromalacia patella.     March 2018 ANA1:40 nucleolar, RNP4.2, C3-C4 normal, vitamin D 10  Imaging: No results found.  Speciality Comments: No specialty comments available.    Procedures:  No procedures performed Allergies: Corn starch and Gluten meal   Assessment / Plan:     Visit Diagnoses: Trochanteric bursitis of left hip: She is been having tenderness and discomfort in the left trochanteric area. I will refer her to physical therapy for trochanteric bursitis.  Pain in left hip joint: I will obtain x-ray of the left hip joint today.  Primary osteoarthritis of both knees: chronic pain  Chondromalacia patellae, right knee: Muscle strengthening exercises were discussed.  Autoimmune disease: history of positive ANA with negative titer, positive RNP,which is been persistently high. fatigue and hair thinning.we had detailed discussion regarding  possible use of Plaquenil. Indications side effects contraindications were discussed at length. She wants to proceed with Plaquenil. We will give it a try for overnight 6 months. Informed consent was obtain. We will call in prescription for Plaquenil 200 mg by mouth twice a day Monday through Friday. She will need baseline eye exam and that eye exam every year. She'll need labs in a month and then every 3 months to monitor for drug toxicity.  Patient was counseled on the purpose, proper use, and adverse effects of hydroxychloroquine including nausea/diarrhea, skin rash, headaches, and sun sensitivity.  Discussed importance of annual eye exams while on hydroxychloroquine to monitor to ocular toxicity and discussed importance of frequent laboratory monitoring.  Provided patient with eye exam form for baseline ophthalmologic exam.  Provided patient with educational materials on hydroxychloroquine and answered all questions.  Patient consented to hydroxychloroquine.she should get pneumococcal vaccine and flu vaccine.  Will upload consent in the media tab.     Vitamin D deficiency: She has severe vitamin D deficiency. Ideally her vitamin D should be around 50. Patient reports her most recent vitamin D was 15. She's currently taking vitamin D 50,000 units once a week. My recommendation will be to increase vitamin D to 50,000 units twice a week and then repeat it in 3 months. She will need long-term vitamin D due to chronic deficiency. I would recommend obtaining anti-tTG and antigliadin antibodies with the next labs due to persistent vitamin D deficiency.  Other fatigue: It may be related to vitamin D deficiency.  Hair thinning  History of diabetes mellitus: Will avoid cortisone injection.  History of hypertension  History of gluten intolerance    Orders: Orders Placed This Encounter  Procedures  . XR HIP UNILAT W OR W/O PELVIS 2-3 VIEWS LEFT  . COMPLETE METABOLIC PANEL WITH GFR  . CBC with  Differential/Platelet  . Tissue Transglutaminase Abs,IgG,IgA  . Gliadin Antibodies, Serum   Meds ordered this encounter  Medications  . hydroxychloroquine (PLAQUENIL) 200 MG tablet    Sig: Take 1 tablet (200 mg total) by mouth 2 (two) times daily. Monday through Friday    Dispense:  40 tablet    Refill:  2     Follow-Up Instructions: Return in about 6 months (around 10/24/2017) for trochanteric bursitis, positive ANA, +RNP.   Bo Merino, MD  Note - This record has been created using Editor, commissioning.  Chart creation errors have been sought, but may not always  have been located. Such creation errors do not reflect on  the standard of  medical care.

## 2017-04-26 ENCOUNTER — Telehealth: Payer: Self-pay | Admitting: Radiology

## 2017-04-26 ENCOUNTER — Ambulatory Visit (INDEPENDENT_AMBULATORY_CARE_PROVIDER_SITE_OTHER): Payer: Self-pay

## 2017-04-26 ENCOUNTER — Encounter: Payer: Self-pay | Admitting: Rheumatology

## 2017-04-26 ENCOUNTER — Ambulatory Visit (INDEPENDENT_AMBULATORY_CARE_PROVIDER_SITE_OTHER): Payer: BLUE CROSS/BLUE SHIELD | Admitting: Rheumatology

## 2017-04-26 VITALS — BP 124/76 | HR 78 | Resp 16 | Ht 63.0 in | Wt 198.0 lb

## 2017-04-26 DIAGNOSIS — Z8719 Personal history of other diseases of the digestive system: Secondary | ICD-10-CM

## 2017-04-26 DIAGNOSIS — Z8639 Personal history of other endocrine, nutritional and metabolic disease: Secondary | ICD-10-CM | POA: Diagnosis not present

## 2017-04-26 DIAGNOSIS — M25552 Pain in left hip: Secondary | ICD-10-CM | POA: Diagnosis not present

## 2017-04-26 DIAGNOSIS — L659 Nonscarring hair loss, unspecified: Secondary | ICD-10-CM | POA: Diagnosis not present

## 2017-04-26 DIAGNOSIS — Z8679 Personal history of other diseases of the circulatory system: Secondary | ICD-10-CM | POA: Diagnosis not present

## 2017-04-26 DIAGNOSIS — E559 Vitamin D deficiency, unspecified: Secondary | ICD-10-CM | POA: Diagnosis not present

## 2017-04-26 DIAGNOSIS — R5383 Other fatigue: Secondary | ICD-10-CM

## 2017-04-26 DIAGNOSIS — M7062 Trochanteric bursitis, left hip: Secondary | ICD-10-CM | POA: Diagnosis not present

## 2017-04-26 DIAGNOSIS — M170 Bilateral primary osteoarthritis of knee: Secondary | ICD-10-CM | POA: Diagnosis not present

## 2017-04-26 DIAGNOSIS — Z79899 Other long term (current) drug therapy: Secondary | ICD-10-CM | POA: Diagnosis not present

## 2017-04-26 DIAGNOSIS — R768 Other specified abnormal immunological findings in serum: Secondary | ICD-10-CM

## 2017-04-26 DIAGNOSIS — M2241 Chondromalacia patellae, right knee: Secondary | ICD-10-CM | POA: Diagnosis not present

## 2017-04-26 MED ORDER — HYDROXYCHLOROQUINE SULFATE 200 MG PO TABS: 200.0000 mg | ORAL_TABLET | Freq: Two times a day (BID) | ORAL | 2 refills | Status: DC

## 2017-04-26 NOTE — Patient Instructions (Addendum)
Standing Labs We placed an order today for your standing lab work.    Please come back and get your standing labs in one month, then in three months, then every five months  We have open lab Monday through Friday from 8:30-11:30 AM and 1:30-4 PM at the office of Dr. Bo Merino.   The office is located at [address] [place], [place], [place] [address] No appointment is necessary.   Labs are drawn by [facility].  You may receive a bill from [facility] for your lab work. If you have any questions regarding directions or hours of operation,  please call [phone].    Next labs we will check your vitamin D , antigliadin, and TTG (these test for Celiac disease)   Hydroxychloroquine tablets What is this medicine? HYDROXYCHLOROQUINE (hye drox ee KLOR oh kwin) is used to treat rheumatoid arthritis and systemic lupus erythematosus. It is also used to treat malaria. This medicine may be used for other purposes; ask your health care provider or pharmacist if you have questions. COMMON BRAND NAME(S): Plaquenil, Quineprox What should I tell my health care provider before I take this medicine? They need to know if you have any of these conditions: -diabetes -eye disease, vision problems -G6PD deficiency -history of blood diseases -history of irregular heartbeat -if you often drink alcohol -kidney disease -liver disease -porphyria -psoriasis -seizures -an unusual or allergic reaction to chloroquine, hydroxychloroquine, other medicines, foods, dyes, or preservatives -pregnant or trying to get pregnant -breast-feeding How should I use this medicine? Take this medicine by mouth with a glass of water. Follow the directions on the prescription label. Avoid taking antacids within 4 hours of taking this medicine. It is best to separate these medicines by at least 4 hours. Do not cut, crush or chew this medicine. You can take it with or without food. If it upsets your stomach, take it with  food. Take your medicine at regular intervals. Do not take your medicine more often than directed. Take all of your medicine as directed even if you think you are better. Do not skip doses or stop your medicine early. Talk to your pediatrician regarding the use of this medicine in children. While this drug may be prescribed for selected conditions, precautions do apply. Overdosage: If you think you have taken too much of this medicine contact a poison control center or emergency room at once. NOTE: This medicine is only for you. Do not share this medicine with others. What if I miss a dose? If you miss a dose, take it as soon as you can. If it is almost time for your next dose, take only that dose. Do not take double or extra doses. What may interact with this medicine? Do not take this medicine with any of the following medications: -cisapride -dofetilide -dronedarone -live virus vaccines -penicillamine -pimozide -thioridazine -ziprasidone This medicine may also interact with the following medications: -ampicillin -antacids -cimetidine -cyclosporine -digoxin -medicines for diabetes, like insulin, glipizide, glyburide -medicines for seizures like carbamazepine, phenobarbital, phenytoin -mefloquine -methotrexate -other medicines that prolong the QT interval (cause an abnormal heart rhythm) -praziquantel This list may not describe all possible interactions. Give your health care provider a list of all the medicines, herbs, non-prescription drugs, or dietary supplements you use. Also tell them if you smoke, drink alcohol, or use illegal drugs. Some items may interact with your medicine. What should I watch for while using this medicine? Tell your doctor or healthcare professional if your symptoms do not start  to get better or if they get worse. Avoid taking antacids within 4 hours of taking this medicine. It is best to separate these medicines by at least 4 hours. Tell your doctor or  health care professional right away if you have any change in your eyesight. Your vision and blood may be tested before and during use of this medicine. This medicine can make you more sensitive to the sun. Keep out of the sun. If you cannot avoid being in the sun, wear protective clothing and use sunscreen. Do not use sun lamps or tanning beds/booths. What side effects may I notice from receiving this medicine? Side effects that you should report to your doctor or health care professional as soon as possible: -allergic reactions like skin rash, itching or hives, swelling of the face, lips, or tongue -changes in vision -decreased hearing or ringing of the ears -redness, blistering, peeling or loosening of the skin, including inside the mouth -seizures -sensitivity to light -signs and symptoms of a dangerous change in heartbeat or heart rhythm like chest pain; dizziness; fast or irregular heartbeat; palpitations; feeling faint or lightheaded, falls; breathing problems -signs and symptoms of liver injury like dark yellow or brown urine; general ill feeling or flu-like symptoms; light-colored stools; loss of appetite; nausea; right upper belly pain; unusually weak or tired; yellowing of the eyes or skin -signs and symptoms of low blood sugar such as feeling anxious; confusion; dizziness; increased hunger; unusually weak or tired; sweating; shakiness; cold; irritable; headache; blurred vision; fast heartbeat; loss of consciousness -uncontrollable head, mouth, neck, arm, or leg movements Side effects that usually do not require medical attention (report to your doctor or health care professional if they continue or are bothersome): -anxious -diarrhea -dizziness -hair loss -headache -irritable -loss of appetite -nausea, vomiting -stomach pain This list may not describe all possible side effects. Call your doctor for medical advice about side effects. You may report side effects to FDA at  1-800-FDA-1088. Where should I keep my medicine? Keep out of the reach of children. In children, this medicine can cause overdose with small doses. Store at room temperature between 15 and 30 degrees C (59 and 86 degrees F). Protect from moisture and light. Throw away any unused medicine after the expiration date. NOTE: This sheet is a summary. It may not cover all possible information. If you have questions about this medicine, talk to your doctor, pharmacist, or health care provider.  2018 Elsevier/Gold Standard (2016-03-03 14:16:15)

## 2017-04-26 NOTE — Telephone Encounter (Signed)
Patient asked about FMLA forms when she was here, Dr Estanislado Pandy would like her PCP to fill these out for her, this is not something she would be able to help her with

## 2017-04-28 ENCOUNTER — Telehealth: Payer: Self-pay | Admitting: Rheumatology

## 2017-04-28 ENCOUNTER — Telehealth: Payer: Self-pay | Admitting: Family

## 2017-04-28 NOTE — Telephone Encounter (Signed)
Noted  

## 2017-04-28 NOTE — Telephone Encounter (Signed)
Ok to give the note for to day. She should try PLQ 1 tab po qd.

## 2017-04-28 NOTE — Telephone Encounter (Signed)
Can we provide patient with a note out of work? Please advise

## 2017-04-28 NOTE — Telephone Encounter (Signed)
Received FMLA forms for patient. She is requesting 1 day a week to be out for fatigue for her lupus she states.   She has been informed that these forms need to filled out by the Rheumatology who treats this.   She understood. I informed I would fax them over, she stated they already had a copy.

## 2017-04-28 NOTE — Telephone Encounter (Signed)
Have called patient to discuss

## 2017-04-28 NOTE — Telephone Encounter (Signed)
Called pt to discuss provided with work note for today, advised her we can not complete her disability forms for her.

## 2017-04-28 NOTE — Telephone Encounter (Addendum)
Per patient she started Plaquenil two days ago. Per patient she is nauseous with diarrhea today. Patient requesting a note to excuse her from work today due to this. Please call to advise.

## 2017-05-03 ENCOUNTER — Telehealth: Payer: Self-pay | Admitting: Nurse Practitioner

## 2017-05-03 NOTE — Telephone Encounter (Signed)
Patient calls with complaints of ongoing diarrhea and loose stools. She says she has "lost count" on the number of stools. "Everything I eat goes stra0ight through me."  Afebrile. No bloody stools. She is on Bentyl. Please advise.

## 2017-05-04 ENCOUNTER — Other Ambulatory Visit: Payer: BLUE CROSS/BLUE SHIELD

## 2017-05-04 DIAGNOSIS — R197 Diarrhea, unspecified: Secondary | ICD-10-CM

## 2017-05-04 NOTE — Telephone Encounter (Signed)
Patient calling back regarding this.  °

## 2017-05-04 NOTE — Telephone Encounter (Signed)
Contacted the patient. No stool was submitted for testing as recommended at her recent visit for this issue. I have asked her to do this asap.Instructed she must collect liquid stool, not formed stool. She also wants to know if the FMLA papers have been signed. States [employer] told her the paper is in our possession.

## 2017-05-05 LAB — CLOSTRIDIUM DIFFICILE BY PCR: Toxigenic C. Difficile by PCR: NOT DETECTED

## 2017-05-06 ENCOUNTER — Telehealth: Payer: Self-pay

## 2017-05-06 ENCOUNTER — Ambulatory Visit: Payer: BLUE CROSS/BLUE SHIELD | Attending: Otolaryngology

## 2017-05-06 NOTE — Telephone Encounter (Signed)
She has an appointment to discuss Lotenex.in 2 days.

## 2017-05-06 NOTE — Telephone Encounter (Signed)
-----   Message from Mauri Pole, MD sent at 05/06/2017  1:10 PM EDT ----- Patient cannot swallow pills, she has to crush her pill? For unclear reason. No food dysphagia. She has functional issues. Please have patient do 72 hour stool collection (fecal fat) prior to prescribing Lotronex as it is unclear regarding the stool frequency and the stool volume. Thanks

## 2017-05-06 NOTE — Telephone Encounter (Signed)
Beth, I would not get involved with the [employer] stuff, it has been messy. We have the papers but [employer] is not going to give her more FMLA time for the IBS. Plan is to try Lotronex and see if we can get diarrhea under control. Thanks

## 2017-05-06 NOTE — Telephone Encounter (Signed)
Attempted to contact the patient. No answer. No voicemail. 

## 2017-05-09 NOTE — Telephone Encounter (Signed)
Patient has an appointment 05/10/17.

## 2017-05-10 ENCOUNTER — Ambulatory Visit (INDEPENDENT_AMBULATORY_CARE_PROVIDER_SITE_OTHER): Payer: BLUE CROSS/BLUE SHIELD | Admitting: Nurse Practitioner

## 2017-05-10 ENCOUNTER — Encounter: Payer: Self-pay | Admitting: Nurse Practitioner

## 2017-05-10 VITALS — BP 152/84 | Ht 63.0 in | Wt 195.0 lb

## 2017-05-10 DIAGNOSIS — K580 Irritable bowel syndrome with diarrhea: Secondary | ICD-10-CM

## 2017-05-10 MED ORDER — DIPHENOXYLATE-ATROPINE 2.5-0.025 MG PO TABS: 1.0000 | ORAL_TABLET | Freq: Two times a day (BID) | ORAL | 2 refills | Status: DC

## 2017-05-10 NOTE — Patient Instructions (Addendum)
If you are age 44 or older, your body mass index should be between 23-30. Your Body mass index is 34.54 kg/m. If this is out of the aforementioned range listed, please consider follow up with your Primary Care Provider.  If you are age 81 or younger, your body mass index should be between 19-25. Your Body mass index is 34.54 kg/m. If this is out of the aformentioned range listed, please consider follow up with your Primary Care Provider.   We have sent the following medications to your pharmacy for you to pick up at your convenience: Lomotil  Call in 7-10 days with an update.  Thank you for choosing me and [facility].   Tye Savoy, NP

## 2017-05-10 NOTE — Progress Notes (Signed)
HPI: Patient is a 46 year old female who came to [place] in November 2017 for evaluation of postprandial diarrhea. She had been a patient of Dr. Collene Mares and came to [place] for a second opinion. Records were reviewed and in summary,  celiac labs were negative.  She had a colonoscopy in 2010 with removal of multiple colonic polyps, some tubular adenomas and some hyperplastic. She had another colonoscopy in 08/2013 with findings of scattered diverticula in the entire examined colon. TI intubated and examined portion of the ileum was again normal. Colon biopsies showed benign colonic mucosa with increased intraepithelial lymphocytes. She recalls trying prednisone which didn't help but cause blood sugar elevation. She was tried on Xifaxan and probiotics without improvement. She has tried Imodium but it cause stomach pain and vomiting. We repeated her colonoscopy in  Feb 2018,  random biopsies were normal. A sigmoid polyp was hyperplastic.   I saw Angela Cisneros last month for ongoing postprandial diarrhea. I tried her on Bentyl to slow down gastrocolic reflex but she says it made the diarrhea worse.  She has had FMLA benefits for over a year because of the diarrhea. She can't work when the diarrhea flares. Nadean works as a Recruitment consultant. She arrives for work at 4:30 am. She eats around 9 AM. Within 15 minutes the diarrhea starts. 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 After work patient eats at home, has bowel movements and does not eat the rest of the day so she can sleep through the night. When I saw her last month she was requesting that we update her FMLA papers. I did not have papers in my possession at that time. Since then I have located the papers that were dropped off at her office a few weeks ago. This was my first time seeing the patient, I wanted to speak with her primary GI doctor, Dr. Silverio Decamp.    Past Medical History:  Diagnosis Date  . Diabetes mellitus without complication  ([place])    weight controlled  . Fibroid, uterine   . Headache(784.0)   . Herpes   . Hypertension   . Hypothyroidism     Patient's surgical history, family medical history, social history, medications and allergies were all reviewed in Epic    Physical Exam: BP (!) 152/84   Ht 5\' 3"  (1.6 m)   Wt 195 lb (88.5 kg)   BMI 34.54 kg/m   GENERAL:  Pleasant black female in NAD PSYCH: :Pleasant, cooperative, normal affect NEURO: Alert and oriented x 3, no focal neurologic deficits   ASSESSMENT and PLAN:  Pleasant 46 year old female who I brought in today to discuss chronic diarrhea and her request for FMLA papers to be filled out. Her days off for diarrhea flares have been covered under FMLA for over a year. 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 We cannot extend FMLA benefits.  -trial of Lomotil bid 30 minutes prior to two main meals. If no improvement patient will call and we will move on to trial of Lotronex. I had her complete the acknowledgment form for the medication. In the interim I will see if Lotronex can be crushed as she has difficulty swallowing pills. She understands that lomotil is small pill and will try her best to swallow it. Options for treatment are limited if she cannot swallow pills.  -continue the cholestyramine for now. Diarrhea may get worse if she stops it and I don't  want to add any variables while trying to gauge her response to Lomotil.    I spent 25 minutes of face-to-face time with the patient. Greater than 50% of the time was spent counseling and coordinating care. Questions answered  Tye Savoy , NP 05/10/2017, 2:29 PM

## 2017-05-17 ENCOUNTER — Telehealth: Payer: Self-pay | Admitting: Nurse Practitioner

## 2017-05-17 NOTE — Telephone Encounter (Signed)
How do you want to handle this?

## 2017-05-18 NOTE — Progress Notes (Signed)
Reviewed and agree with documentation and assessment and plan. K. Veena Nandigam , MD   

## 2017-05-19 NOTE — Telephone Encounter (Signed)
Angela Cisneros, this is a challenging situation. She either cannot tolerate antidiarrheals or that don't work. I will give her a work excuse for that day since her symptoms were from lomotil which I started. We have to figure out something with her as we cannot repeatedly excuse her from work. Dr Silverio Decamp may want further workup on her so at this point please make sure she has an appt with Dr. Silverio Decamp. Thanks

## 2017-05-23 ENCOUNTER — Other Ambulatory Visit: Payer: Self-pay

## 2017-05-23 NOTE — Telephone Encounter (Signed)
Letter for the patient mailed and sent through "My Chart."

## 2017-07-25 ENCOUNTER — Telehealth: Payer: Self-pay | Admitting: Family

## 2017-07-25 ENCOUNTER — Ambulatory Visit: Payer: Self-pay

## 2017-07-25 ENCOUNTER — Ambulatory Visit: Payer: BLUE CROSS/BLUE SHIELD | Admitting: Internal Medicine

## 2017-07-25 NOTE — Telephone Encounter (Signed)
No just an FYI. Patient has an appointment with you.

## 2017-07-25 NOTE — Telephone Encounter (Signed)
Copied from [place] [id number]. Topic: Appointment Scheduling - Scheduling Inquiry for Clinic >> Jul 25, 2017  9:48 AM Valla Leaver wrote: Reason for CRM: Patient's mom "Susette Racer" see's [facility] and patient was seeing G Calone. She wants to know if Qu[facility] Burow would be willin gto take her on as a new patient? Please call patient to advise. Currently she has appt sched w/ [facility] on 03/11 for new pt transfer appt.

## 2017-07-25 NOTE — Telephone Encounter (Signed)
Copied from [place]. Topic: Quick Communication - Rx Refill/Question >> Jul 25, 2017  9:39 AM Marin Olp L wrote: Has the patient contacted their pharmacy? Yes.   (Agent: If no, request that the patient contact the pharmacy for the refill.) Preferred Pharmacy (with phone number or street name): [facility] [phone] - [place], [address] Agent: Please be advised that RX refills may take up to 3 business days. We ask that you follow-up with your pharmacy. Had to stop losartan b/c it was recalled. Needs replacement script for high bp. Has a headache now. Was seeing Calone and has not established w/ someone else in office yet.

## 2017-07-25 NOTE — Telephone Encounter (Signed)
Please let her know that unfortunately I am not accepting new patients at this time.

## 2017-07-25 NOTE — Telephone Encounter (Signed)
LVM to inform patient of notes below. And that she needs to keep her appointment with [facility]. If he has any questions to call back.

## 2017-07-25 NOTE — Telephone Encounter (Signed)
See triage encounter for today.

## 2017-07-25 NOTE — Telephone Encounter (Signed)
Patient called in with c/o "excruciating headache. I woke up with it at 9:15am." Patient reports being off of [place] x 1 week due to a recall of the medicine reported to her from her pharmacist. When asked did she call the office to notify of the recall, she said she thought the pharmacist would let the office know, so she's been waiting for a call.  She reports having a mild headache last week, but it went away. She reports having migraines in the past but says this is a different type of headache and she feels it's from her not taking her BP medication and her BP is elevated. When asked if she has any way to check her BP at home, she said "no." She denies any other symptoms, see assessment below.  According to the protocol, she is to see the PCP within 3 days, flow coordinator notified and appointment was made for patient today, care advice given, patient verbalized understanding.  Reason for Disposition . [1] MILD-MODERATE headache AND [2] present > 72 hours  Answer Assessment - Initial Assessment Questions 1. LOCATION: "Where does it hurt?"      Headache-Left side in the front of head 2. ONSET: "When did the headache start?" (Minutes, hours or days)      Woke up this morning at 9:15am with the headache 3. PATTERN: "Does the pain come and go, or has it been constant since it started?"     Constant since 9:15am 4. SEVERITY: "How bad is the pain?" and "What does it keep you from doing?"  (e.g., Scale 1-10; mild, moderate, or severe)   - MILD (1-3): doesn't interfere with normal activities    - MODERATE (4-7): interferes with normal activities or awakens from sleep    - SEVERE (8-10): excruciating pain, unable to do any normal activities        7-8 severity 5. RECURRENT SYMPTOM: "Have you ever had headaches before?" If so, ask: "When was the last time?" and "What happened that time?"      Yes-migraine, but this headache is not a migraine. 6. CAUSE: "What do you think is causing the headache?"     Think it's my high blood pressure 7. MIGRAINE: "Have you been diagnosed with migraine headaches?" If so, ask: "Is this headache similar?"      Yes-not this type of headache 8. HEAD INJURY: "Has there been any recent injury to the head?"      Denies 9. OTHER SYMPTOMS: "Do you have any other symptoms?" (fever, stiff neck, eye pain, sore throat, cold symptoms)     Denies any symptoms 10. PREGNANCY: "Is there any chance you are pregnant?" "When was your last menstrual period?"       No-hysterectomy  Protocols used: HEADACHE-A-AH

## 2017-07-25 NOTE — Telephone Encounter (Signed)
Is there a question about this?

## 2017-08-28 ENCOUNTER — Other Ambulatory Visit: Payer: Self-pay | Admitting: Rheumatology

## 2017-08-29 NOTE — Telephone Encounter (Signed)
Left message to advise patient she will need labs to have prescription refilled.

## 2017-10-10 ENCOUNTER — Ambulatory Visit: Payer: BLUE CROSS/BLUE SHIELD | Admitting: Nurse Practitioner

## 2017-10-12 NOTE — Progress Notes (Signed)
Office Visit Note  Patient: Glynn Octave             Date of Birth: [birth year]           MRN: [id number]             PCP: System, Pcp Not In Referring: Golden Circle, FNP Visit Date: 10/25/2017 Occupation: @GUAROCC @    Subjective:  Bilateral trochanteric bursitis    History of Present Illness: Adiba A Renn is a 47 y.o. female with history of autoimmune disease and osteoarthritis.  Patient continues to take Plaquenil 200 mg twice daily Monday through Friday.  She denies missing any doses recently.  She denies any oral or nasal ulcers.  She denies any swollen lymph nodes or rashes. She continues to have photosensitivity and wears sunscreen on a regular basis. She denies any raynaud's.  She states her hair thinning has improved since starting to take biotin and vitamin E.  She has dry eyes but no symptoms of dry mouth.  Patient states that she saw her PCP about 2 weeks ago for bilateral hip pain.  She was given an injection of Toradol which helped with her pain temporarily.  She states she continues to have pain in bilateral trochanteric bursa.  Patient states she has occasional discomfort in her bilateral hands.  She describes the pain as a burning sensation.  She denies any numbness or tingling.  She denies any joint swelling.  She states she has joint stiffness in her hands. She reports she continues to take vitamin D 50,000 units once a week   Activities of Daily Living:  Patient reports morning stiffness for 4-5 hours.   Patient Denies nocturnal pain.  Difficulty dressing/grooming: Denies Difficulty climbing stairs: Reports Difficulty getting out of chair: Denies Difficulty using hands for taps, buttons, cutlery, and/or writing: Denies   Review of Systems  Constitutional: Positive for fatigue.  HENT: Negative for mouth sores, mouth dryness and nose dryness.   Eyes: Positive for dryness. Negative for pain and visual disturbance.  Respiratory: Negative for cough,  hemoptysis, shortness of breath and difficulty breathing.   Cardiovascular: Positive for palpitations. Negative for chest pain, hypertension and swelling in legs/feet.  Gastrointestinal: Positive for diarrhea. Negative for blood in stool and constipation.  Endocrine: Negative for increased urination.  Genitourinary: Negative for painful urination.  Musculoskeletal: Positive for arthralgias, joint pain and morning stiffness. Negative for joint swelling, myalgias, muscle weakness, muscle tenderness and myalgias.  Skin: Positive for hair loss and sensitivity to sunlight. Negative for color change, pallor, rash, nodules/bumps, skin tightness and ulcers.  Allergic/Immunologic: Negative for susceptible to infections.  Neurological: Negative for dizziness, numbness, headaches and weakness.  Hematological: Negative for swollen glands.  Psychiatric/Behavioral: Positive for sleep disturbance. Negative for depressed mood. The patient is not nervous/anxious.     PMFS History:  Patient Active Problem List   Diagnosis Date Noted  . 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 Positive RNP antibody 04/25/2017  . History of hypertension 04/25/2017  . History of diabetes mellitus 04/25/2017  . History of gluten intolerance 04/25/2017  . Lump in throat 04/11/2017  . Thyroid disease 10/20/2016  . ANA positive 10/18/2016  . Essential hypertension 10/18/2016  . Type 2 diabetes mellitus ([place]) 10/18/2016  . Other fatigue 10/18/2016  . Hair thinning 10/18/2016  . Rectal bleeding 07/21/2016  . Chronic diarrhea 07/21/2016  . Diarrhea 07/14/2016  . Bloating 07/14/2016  . Myalgia 06/17/2016  . Primary osteoarthritis of both knees 06/17/2016  .  S/P panniculectomy 11/18/2014  . Abdominal pain 01/08/2009  . NAUSEA WITH VOMITING 01/08/2009  . TROCHANTERIC BURSITIS, RIGHT 12/12/2008  . ALLERGIC REACTION 10/11/2008    Past Medical History:  Diagnosis Date  . Diabetes mellitus without complication ([place])     weight controlled  . Fibroid, uterine   . Headache(784.0)   . Herpes   . Hypertension   . Hypothyroidism     Family History  Problem Relation Age of Onset  . Hypertension Other   . Diabetes Other   . Hypertension Mother   . Diabetes Mother   . Dementia Mother   . Asthma Son   . Eczema Son   . Asthma Daughter   . Eczema Daughter   . Colon cancer Cousin        mat cousins father  . Hypertension Brother   . Hypertension Brother   . Anesthesia problems Neg Hx   . Hypotension Neg Hx   . Malignant hyperthermia Neg Hx   . Pseudochol deficiency Neg Hx   . Angioedema Neg Hx   . Atopy Neg Hx   . Immunodeficiency Neg Hx   . Urticaria Neg Hx    Past Surgical History:  Procedure Laterality Date  . CHOLECYSTECTOMY    . COLONOSCOPY    . KNEE SURGERY    . LIPOSUCTION N/A 11/18/2014   Procedure: LIPOSUCTION;  Surgeon: Cristine Polio, MD;  Location: [facility];  Service: Plastics;  Laterality: N/A;  . PANNICULECTOMY  11/18/2014   with repair / diastasis recti  . PANNICULECTOMY N/A 11/18/2014   Procedure: PANNICULECTOMY WITH REPAIR OF DIASTASIS RECTI ;  Surgeon: Cristine Polio, MD;  Location: [facility];  Service: Plastics;  Laterality: N/A;  . POLYPECTOMY    . THYROID SURGERY    . TUBAL LIGATION    . VAGINAL HYSTERECTOMY     Social History   Social History Narrative   Fun/Hobby: No hobbies     Objective: Vital Signs: BP (!) 146/84 (BP Location: Left Arm, Patient Position: Sitting, Cuff Size: Large)   Pulse 89   Resp 14   Ht 5\' 3"  (1.6 m)   Wt 197 lb (89.4 kg)   BMI 34.90 kg/m    Physical Exam  Constitutional: She is oriented to person, place, and time. She appears well-developed and well-nourished.  HENT:  Head: Normocephalic and atraumatic.  Eyes: Conjunctivae and EOM are normal.  Neck: Normal range of motion.  Cardiovascular: Normal rate, regular rhythm, normal heart sounds and intact distal pulses.  Pulmonary/Chest: Effort normal and breath sounds normal.    Abdominal: Soft. Bowel sounds are normal.  Lymphadenopathy:    She has no cervical adenopathy.  Neurological: She is alert and oriented to person, place, and time.  Skin: Skin is warm and dry. Capillary refill takes less than 2 seconds.  Psychiatric: She has a normal mood and affect. Her behavior is normal.  Nursing note and vitals reviewed.    Musculoskeletal Exam: She has C-spine limited range of motion with lateral rotation to the left.  Thoracic and lumbar spine good range of motion.  No midline spinal tenderness.  No SI joint tenderness.  Shoulder joints, elbow joints, wrist joints, MCPs, PIPs, DIPs good range of motion with no synovitis.  Hip joints, knee joints, ankle joints, MTPs, PIPs, DIPs good range of motion with no synovitis.  No warmth or effusion of bilateral knees.  She has bilateral trochanteric bursa tenderness.  CDAI Exam: No CDAI exam completed.    Investigation: No additional findings.PLQ  eye exam: 05/16/2017 CBC Latest Ref Rng & Units 10/21/2016 07/01/2016 11/19/2014  WBC 3.8 - 10.8 K/uL 6.2 5.1 7.2  Hemoglobin 11.7 - 15.5 g/dL 14.0 14.5 12.3  Hematocrit 35.0 - 45.0 % 41.8 42.5 36.9  Platelets 140 - 400 K/uL 261 218.0 209   CMP Latest Ref Rng & Units 04/08/2017 10/20/2016 07/01/2016  Glucose 70 - 99 mg/dL 387(H) 209(H) 201(H)  BUN 6 - 23 mg/dL 12 11 11   Creatinine 0.40 - 1.20 mg/dL 0.90 0.73 0.83  Sodium 135 - 145 mEq/L 133(L) 134(L) 139  Potassium 3.5 - 5.1 mEq/L 4.0 4.4 4.4  Chloride 96 - 112 mEq/L 99 103 103  CO2 19 - 32 mEq/L 23 26 29   Calcium 8.4 - 10.5 mg/dL 9.0 9.2 8.6  Total Protein 6.0 - 8.3 g/dL - 6.7 -  Total Bilirubin 0.2 - 1.2 mg/dL - 0.3 -  Alkaline Phos 39 - 117 U/L - 83 -  AST 0 - 37 U/L - 12 -  ALT 0 - 35 U/L - 15 -    Imaging: No results found.  Speciality Comments: PLQ Eye Exam: 05/16/17 WNL @ [facility]. Follow up in 1 year    Procedures:  No procedures performed Allergies: Canagliflozin; Corn starch; Empagliflozin;  Gluten meal; and Metformin   Assessment / Plan:     Visit Diagnoses: Autoimmune disease ([place]) - history of positive ANA with negative titer, positive RNP,which is been persistently high. fatigue and hair thinning.: She has not had any recent flares. She continues to have photosensitivity, chronic fatigue, and hair thinning. Her hair thinning is improving with biotin and vitamin E supplements. No oral or nasal ulcers were noted on exam.  No cervical lymphadenopathy.  No malar rash. No symptoms of Raynaud's.  She has dry eyes but no dry mouth. Autoimmune labs were ordered today.  She will continue on Plaquenil 200 mg twice daily Monday through Friday.  Refill Plaquenil sent to the pharmacy today.- Plan: CBC with Differential/Platelet, COMPLETE METABOLIC PANEL WITH GFR, Urinalysis, Routine w reflex microscopic, ANA, Anti-DNA antibody, double-stranded, C3 and C4, Sedimentation rate, VITAMIN D 25 Hydroxy (Vit-D Deficiency, Fractures) She was given a work note for today's appointment.  High risk medication use - PLQeye exam: 05/16/2017.  CBC and CMP were ordered today to monitor for drug toxicity.- Plan: CBC with Differential/Platelet, COMPLETE METABOLIC PANEL WITH GFR  Primary osteoarthritis of both knees: No warmth or effusion of bilateral knees.  She is some limited extension in her left knee.  She has bilateral knee crepitus.  She has not been experiencing any discomfort at this time.  Chondromalacia patellae, right knee: Right knee crepitus.  No warmth or effusion on exam.  She has good range of motion of her right knee.  Trochanteric bursitis of both hips: She has tenderness of bilateral trochanteric bursa.  We discussed exercises that she can perform at home.  She was also given a handout of exercises that she can perform.  She declined a cortisone injection at this time.  We also discussed that if she continues to have significant pain we can refer her to physical therapy.  She had a Toradol injection  performed by her PCP about 2 weeks ago which provided temporary relief.  History of vitamin D deficiency: She continues to take vitamin D 50,000 units once a week.  Vitamin D level was checked today.   Other fatigue: Chronic.  Hair thinning: Improved since starting to take Biotin and vitamin E.  Other medical  conditions are listed as follows:  History of gluten intolerance  History of diabetes mellitus  History of hypertension     Orders: Orders Placed This Encounter  Procedures  . CBC with Differential/Platelet  . COMPLETE METABOLIC PANEL WITH GFR  . Urinalysis, Routine w reflex microscopic  . ANA  . Anti-DNA antibody, double-stranded  . C3 and C4  . Sedimentation rate  . VITAMIN D 25 Hydroxy (Vit-D Deficiency, Fractures)   No orders of the defined types were placed in this encounter.   Face-to-face time spent with patient was 30 minutes. >50% of time was spent in counseling and coordination of care.  Follow-Up Instructions: Return in about 6 months (around 04/27/2018) for Autoimmune Disease.   Ofilia Neas, PA-C   I examined and evaluated the patient with Hazel Sams PA. The plan of care was discussed as noted above.  Bo Merino, MD  Note - This record has been created using Editor, commissioning.  Chart creation errors have been sought, but may not always  have been located. Such creation errors do not reflect on  the standard of medical care.

## 2017-10-25 ENCOUNTER — Encounter: Payer: Self-pay | Admitting: Rheumatology

## 2017-10-25 ENCOUNTER — Ambulatory Visit (INDEPENDENT_AMBULATORY_CARE_PROVIDER_SITE_OTHER): Payer: Commercial Managed Care - PPO | Admitting: Rheumatology

## 2017-10-25 VITALS — BP 146/84 | HR 89 | Resp 14 | Ht 63.0 in | Wt 197.0 lb

## 2017-10-25 DIAGNOSIS — Z79899 Other long term (current) drug therapy: Secondary | ICD-10-CM

## 2017-10-25 DIAGNOSIS — M2241 Chondromalacia patellae, right knee: Secondary | ICD-10-CM | POA: Diagnosis not present

## 2017-10-25 DIAGNOSIS — Z8719 Personal history of other diseases of the digestive system: Secondary | ICD-10-CM

## 2017-10-25 DIAGNOSIS — M7062 Trochanteric bursitis, left hip: Secondary | ICD-10-CM | POA: Diagnosis not present

## 2017-10-25 DIAGNOSIS — M7061 Trochanteric bursitis, right hip: Secondary | ICD-10-CM

## 2017-10-25 DIAGNOSIS — D8989 Other specified disorders involving the immune mechanism, not elsewhere classified: Secondary | ICD-10-CM | POA: Diagnosis not present

## 2017-10-25 DIAGNOSIS — M170 Bilateral primary osteoarthritis of knee: Secondary | ICD-10-CM

## 2017-10-25 DIAGNOSIS — R5383 Other fatigue: Secondary | ICD-10-CM

## 2017-10-25 DIAGNOSIS — Z8679 Personal history of other diseases of the circulatory system: Secondary | ICD-10-CM

## 2017-10-25 DIAGNOSIS — M359 Systemic involvement of connective tissue, unspecified: Secondary | ICD-10-CM

## 2017-10-25 DIAGNOSIS — L659 Nonscarring hair loss, unspecified: Secondary | ICD-10-CM | POA: Diagnosis not present

## 2017-10-25 DIAGNOSIS — Z8639 Personal history of other endocrine, nutritional and metabolic disease: Secondary | ICD-10-CM | POA: Diagnosis not present

## 2017-10-25 MED ORDER — HYDROXYCHLOROQUINE SULFATE 200 MG PO TABS: 200.0000 mg | ORAL_TABLET | Freq: Two times a day (BID) | ORAL | 2 refills | Status: DC

## 2017-10-25 NOTE — Patient Instructions (Signed)
Trochanteric Bursitis Rehab Ask your health care provider which exercises are safe for you. Do exercises exactly as told by your health care provider and adjust them as directed. It is normal to feel mild stretching, pulling, tightness, or discomfort as you do these exercises, but you should stop right away if you feel sudden pain or your pain gets worse.Do not begin these exercises until told by your health care provider. Stretching exercises These exercises warm up your muscles and joints and improve the movement and flexibility of your hip. These exercises also help to relieve pain and stiffness. Exercise A: Iliotibial band stretch  1. Lie on your side with your left / right leg in the top position. 2. Bend your left / right knee and grab your ankle. 3. Slowly bring your knee back so your thigh is behind your body. 4. Slowly lower your knee toward the floor until you feel a gentle stretch on the outside of your left / right thigh. If you do not feel a stretch and your knee will not fall farther, place the heel of your other foot on top of your outer knee and pull your thigh down farther. 5. Hold this position for __________ seconds. 6. Slowly return to the starting position. Repeat __________ times. Complete this exercise __________ times a day. Strengthening exercises These exercises build strength and endurance in your hip and pelvis. Endurance is the ability to use your muscles for a long time, even after they get tired. Exercise B: Bridge ( hip extensors) 1. Lie on your back on a firm surface with your knees bent and your feet flat on the floor. 2. Tighten your buttocks muscles and lift your buttocks off the floor until your trunk is level with your thighs. You should feel the muscles working in your buttocks and the back of your thighs. If this exercise is too easy, try doing it with your arms crossed over your chest. 3. Hold this position for __________ seconds. 4. Slowly return to the  starting position. 5. Let your muscles relax completely between repetitions. Repeat __________ times. Complete this exercise __________ times a day. Exercise C: Squats ( knee extensors and  quadriceps) 1. Stand in front of a table, with your feet and knees pointing straight ahead. You may rest your hands on the table for balance but not for support. 2. Slowly bend your knees and lower your hips like you are going to sit in a chair. ? Keep your weight over your heels, not over your toes. ? Keep your lower legs upright so they are parallel with the table legs. ? Do not let your hips go lower than your knees. ? Do not bend lower than told by your health care provider. ? If your hip pain increases, do not bend as low. 3. Hold this position for __________ seconds. 4. Slowly push with your legs to return to standing. Do not use your hands to pull yourself to standing. Repeat __________ times. Complete this exercise __________ times a day. Exercise D: Hip hike 1. Stand sideways on a bottom step. Stand on your left / right leg with your other foot unsupported next to the step. You can hold onto the railing or wall if needed for balance. 2. Keeping your knees straight and your torso square, lift your left / right hip up toward the ceiling. 3. Hold this position for __________ seconds. 4. Slowly let your left / right hip lower toward the floor, past the starting position. Your foot   should get closer to the floor. Do not lean or bend your knees. Repeat __________ times. Complete this exercise __________ times a day. Exercise E: Single leg stand 1. Stand near a counter or door frame that you can hold onto for balance as needed. It is helpful to stand in front of a mirror for this exercise so you can watch your hip. 2. Squeeze your left / right buttock muscles then lift up your other foot. Do not let your left / right hip push out to the side. 3. Hold this position for __________ seconds. Repeat  __________ times. Complete this exercise __________ times a day. This information is not intended to replace advice given to you by your health care provider. Make sure you discuss any questions you have with your health care provider. Document Released: 08/26/2004 Document Revised: 03/25/2016 Document Reviewed: 07/04/2015 Elsevier Interactive Patient Education  2018 [employer] Trochanteric Bursitis Trochanteric bursitis is a condition that causes hip pain. Trochanteric bursitis happens when fluid-filled sacs (bursae) in the hip get irritated. Normally these sacs absorb shock and help strong bands of tissue (tendons) in your hip glide smoothly over each other and over your hip bones. What are the causes? This condition results from increased friction between the hip bones and the tendons that go over them. This condition can happen if you:  Have weak hips.  Use your hip muscles too much (overuse).  Get hit in the hip.  What increases the risk? This condition is more likely to develop in:  Women.  Adults who are middle-aged or older.  People with arthritis or a spinal condition.  People with weak buttocks muscles (gluteal muscles).  People who have one leg that is shorter than the other.  People who participate in certain kinds of athletic activities, such as: ? Running sports, especially long-distance running. ? Contact sports, like football or martial arts. ? Sports in which falls may occur, like skiing.  What are the signs or symptoms? The main symptom of this condition is pain and tenderness over the point of your hip. The pain may be:  Sharp and intense.  Dull and achy.  Felt on the outside of your thigh.  It may increase when you:  Lie on your side.  Walk or run.  Go up on stairs.  Sit.  Stand up after sitting.  Stand for long periods of time.  How is this diagnosed? This condition may be diagnosed based on:  Your symptoms.  Your medical  history.  A physical exam.  Imaging tests, such as: ? X-rays to check your bones. ? An MRI or ultrasound to check your tendons and muscles.  During your physical exam, your health care provider will check the movement and strength of your hip. He or she may press on the point of your hip to check for pain. How is this treated? This condition may be treated by:  Resting.  Reducing your activity.  Avoiding activities that cause pain.  Using crutches, a cane, or a walker to decrease the strain on your hip.  Taking medicine to help with swelling.  Having medicine injected into the bursae to help with swelling.  Using ice, heat, and massage therapy for pain relief.  Physical therapy exercises for strength and flexibility.  Surgery (rare).  Follow these instructions at home: Activity  Rest.  Avoid activities that cause pain.  Return to your normal activities as told by your health care provider. Ask your health care provider what activities   are safe for you. Managing pain, stiffness, and swelling  Take over-the-counter and prescription medicines only as told by your health care provider.  If directed, apply heat to the injured area as told by your health care provider. ? Place a towel between your skin and the heat source. ? Leave the heat on for 20-30 minutes. ? Remove the heat if your skin turns bright red. This is especially important if you are unable to feel pain, heat, or cold. You may have a greater risk of getting burned.  If directed, apply ice to the injured area: ? Put ice in a plastic bag. ? Place a towel between your skin and the bag. ? Leave the ice on for 20 minutes, 2-3 times a day. General instructions  If the affected leg is one that you use for driving, ask your health care provider when it is safe to drive.  Use crutches, a cane, or a walker as told by your health care provider.  If one of your legs is shorter than the other, get fitted for a  shoe insert.  Lose weight if you are overweight. How is this prevented?  Wear supportive footwear that is appropriate for your sport.  If you have hip pain, start any new exercise or sport slowly.  Maintain physical fitness, including: ? Strength. ? Flexibility. Contact a health care provider if:  Your pain does not improve with 2-4 weeks. Get help right away if:  You develop severe pain.  You have a fever.  You develop increased redness over your hip.  You have a change in your bowel function or bladder function.  You cannot control the muscles in your feet. This information is not intended to replace advice given to you by your health care provider. Make sure you discuss any questions you have with your health care provider. Document Released: 08/26/2004 Document Revised: 03/24/2016 Document Reviewed: 07/04/2015 Elsevier Interactive Patient Education  2018 [employer] Inc[employer]  

## 2017-10-27 LAB — URINALYSIS, ROUTINE W REFLEX MICROSCOPIC
BACTERIA UA: NONE SEEN /HPF
Bilirubin Urine: NEGATIVE
HGB URINE DIPSTICK: NEGATIVE
Hyaline Cast: NONE SEEN /LPF
KETONES UR: NEGATIVE
LEUKOCYTES UA: NEGATIVE
Nitrite: NEGATIVE
PROTEIN: NEGATIVE
RBC / HPF: NONE SEEN /HPF (ref 0–2)
Specific Gravity, Urine: 1.01 (ref 1.001–1.03)
Squamous Epithelial / LPF: NONE SEEN /HPF (ref <=5)
WBC UA: NONE SEEN /HPF (ref 0–5)
pH: <=5 (ref 5.0–8.0)

## 2017-10-27 LAB — SEDIMENTATION RATE: Sed Rate: 14 mm/h (ref 0–20)

## 2017-10-27 LAB — COMPLETE METABOLIC PANEL WITH GFR
AG RATIO: 1.5 (calc) (ref 1.0–2.5)
ALT: 22 U/L (ref 6–29)
AST: 17 U/L (ref 10–30)
Albumin: 4 g/dL (ref 3.6–5.1)
Alkaline phosphatase (APISO): 97 U/L (ref 33–115)
BILIRUBIN TOTAL: 0.4 mg/dL (ref 0.2–1.2)
BUN: 10 mg/dL (ref 7–25)
CHLORIDE: 99 mmol/L (ref 98–110)
CO2: 26 mmol/L (ref 20–32)
Calcium: 8.9 mg/dL (ref 8.6–10.2)
Creat: 0.85 mg/dL (ref 0.50–1.10)
GFR, EST AFRICAN AMERICAN: 97 mL/min/{1.73_m2} (ref >=60)
GFR, Est Non African American: 83 mL/min/{1.73_m2} (ref >=60)
Globulin: 2.7 g/dL (calc) (ref 1.9–3.7)
Glucose, Bld: 391 mg/dL — ABNORMAL HIGH (ref 65–99)
POTASSIUM: 4.2 mmol/L (ref 3.5–5.3)
Sodium: 135 mmol/L (ref 135–146)
TOTAL PROTEIN: 6.7 g/dL (ref 6.1–8.1)

## 2017-10-27 LAB — CBC WITH DIFFERENTIAL/PLATELET
BASOS ABS: 21 {cells}/uL (ref 0–200)
Basophils Relative: 0.4 %
EOS ABS: 111 {cells}/uL (ref 15–500)
Eosinophils Relative: 2.1 %
HCT: 43.3 % (ref 35.0–45.0)
Hemoglobin: 15.1 g/dL (ref 11.7–15.5)
Lymphs Abs: 2677 cells/uL (ref 850–3900)
MCH: 30.3 pg (ref 27.0–33.0)
MCHC: 34.9 g/dL (ref 32.0–36.0)
MCV: 86.9 fL (ref 80.0–100.0)
MPV: 11.3 fL (ref 7.5–12.5)
Monocytes Relative: 7 %
NEUTROS PCT: 40 %
Neutro Abs: 2120 cells/uL (ref 1500–7800)
PLATELETS: 268 10*3/uL (ref 140–400)
RBC: 4.98 10*6/uL (ref 3.80–5.10)
RDW: 12.2 % (ref 11.0–15.0)
TOTAL LYMPHOCYTE: 50.5 %
WBC: 5.3 10*3/uL (ref 3.8–10.8)
WBCMIX: 371 {cells}/uL (ref 200–950)

## 2017-10-27 LAB — C3 AND C4
C3 COMPLEMENT: 174 mg/dL (ref 83–193)
C4 Complement: 40 mg/dL (ref 15–57)

## 2017-10-27 LAB — ANTI-DNA ANTIBODY, DOUBLE-STRANDED: ds DNA Ab: <1 IU/mL

## 2017-10-27 LAB — VITAMIN D 25 HYDROXY (VIT D DEFICIENCY, FRACTURES): Vit D, 25-Hydroxy: 31 ng/mL (ref 30–100)

## 2017-10-27 LAB — ANA: ANA: NEGATIVE

## 2017-10-27 NOTE — Progress Notes (Signed)
Glucose is very elevated.  Please notify patient and fax results to PCP.   Autoimmune labs are WNL.  Vitamin D is WNL.

## 2019-01-18 ENCOUNTER — Telehealth: Payer: Self-pay | Admitting: Rheumatology

## 2019-01-18 NOTE — Telephone Encounter (Signed)
Shala left a voicemail stating she was calling from Dr. Inez Catalina Reece's office requesting patient's office visit notes be faxed to their office. [phone]

## 2019-01-18 NOTE — Telephone Encounter (Signed)
Spoke with Virgilio Frees and advised we will need a signed release of records in order to fax office notes. She states she will send one to our office.

## 2019-01-18 NOTE — Telephone Encounter (Signed)
Received a release of records which was not for our office

## 2019-01-30 ENCOUNTER — Encounter: Payer: Self-pay | Admitting: Rheumatology

## 2019-01-30 ENCOUNTER — Ambulatory Visit (INDEPENDENT_AMBULATORY_CARE_PROVIDER_SITE_OTHER): Payer: Commercial Managed Care - PPO | Admitting: Rheumatology

## 2019-01-30 ENCOUNTER — Other Ambulatory Visit: Payer: Self-pay

## 2019-01-30 VITALS — BP 141/100 | HR 89 | Resp 13 | Ht 63.0 in | Wt 192.0 lb

## 2019-01-30 DIAGNOSIS — Z79899 Other long term (current) drug therapy: Secondary | ICD-10-CM | POA: Diagnosis not present

## 2019-01-30 DIAGNOSIS — M170 Bilateral primary osteoarthritis of knee: Secondary | ICD-10-CM

## 2019-01-30 DIAGNOSIS — M7062 Trochanteric bursitis, left hip: Secondary | ICD-10-CM

## 2019-01-30 DIAGNOSIS — Z8639 Personal history of other endocrine, nutritional and metabolic disease: Secondary | ICD-10-CM

## 2019-01-30 DIAGNOSIS — M359 Systemic involvement of connective tissue, unspecified: Secondary | ICD-10-CM | POA: Diagnosis not present

## 2019-01-30 DIAGNOSIS — Z8679 Personal history of other diseases of the circulatory system: Secondary | ICD-10-CM

## 2019-01-30 DIAGNOSIS — R5383 Other fatigue: Secondary | ICD-10-CM

## 2019-01-30 DIAGNOSIS — L659 Nonscarring hair loss, unspecified: Secondary | ICD-10-CM

## 2019-01-30 DIAGNOSIS — M2241 Chondromalacia patellae, right knee: Secondary | ICD-10-CM | POA: Diagnosis not present

## 2019-01-30 DIAGNOSIS — Z8719 Personal history of other diseases of the digestive system: Secondary | ICD-10-CM

## 2019-01-30 DIAGNOSIS — M7061 Trochanteric bursitis, right hip: Secondary | ICD-10-CM

## 2019-01-30 NOTE — Progress Notes (Signed)
Office Visit Note  Patient: Angela Cisneros             Date of Birth: 1971-02-02           MRN: [id number]             PCP: System, Pcp Not In Referring: No ref. provider found Visit Date: 01/30/2019 Occupation: @GUAROCC @  Subjective:  Pain in both forearms   History of Present Illness: Angela Cisneros is a 48 y.o. female with history of autoimmune disease and osteoarthritis. She is no longer taking Plaquenil.  She discontinued PLQ in December 2019.  She is having pain in both forearms. She denies any recent injuries. She denies any joint swelling.  She denies any other joint pain or joint swelling at this time.  She denies any sores in mouth or nose.  She denies any symptoms of Raynaud's.  She has not had any recent rashes.  She states that she does continue to have hair thinning.  She denies any palpitations or shortness of breath recently.  She does have chronic fatigue but it has been stable recently.  Activities of Daily Living:  Patient reports morning stiffness for 5  minutes.   Patient Denies nocturnal pain.  Difficulty dressing/grooming: Denies Difficulty climbing stairs: Denies Difficulty getting out of chair: Denies Difficulty using hands for taps, buttons, cutlery, and/or writing: Denies  Review of Systems  Constitutional: Positive for fatigue.  HENT: Negative for mouth sores, mouth dryness and nose dryness.   Eyes: Negative for pain, visual disturbance and dryness.  Respiratory: Negative for cough, hemoptysis, shortness of breath and difficulty breathing.   Cardiovascular: Negative for chest pain, palpitations, hypertension and swelling in legs/feet.  Gastrointestinal: Negative for blood in stool, constipation and diarrhea.  Endocrine: Negative for increased urination.  Genitourinary: Negative for painful urination.  Musculoskeletal: Positive for arthralgias, joint pain and morning stiffness. Negative for joint swelling, myalgias, muscle weakness, muscle  tenderness and myalgias.  Skin: Positive for hair loss. Negative for color change, pallor, rash, nodules/bumps, skin tightness, ulcers and sensitivity to sunlight.  Allergic/Immunologic: Negative for susceptible to infections.  Neurological: Negative for dizziness, numbness, headaches and weakness.  Hematological: Negative for swollen glands.  Psychiatric/Behavioral: Positive for sleep disturbance. Negative for depressed mood. The patient is not nervous/anxious.     PMFS History:  Patient Active Problem List   Diagnosis Date Noted  . 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 Positive RNP antibody 04/25/2017  . History of hypertension 04/25/2017  . History of diabetes mellitus 04/25/2017  . History of gluten intolerance 04/25/2017  . Lump in throat 04/11/2017  . Thyroid disease 10/20/2016  . ANA positive 10/18/2016  . Essential hypertension 10/18/2016  . Type 2 diabetes mellitus ([place]) 10/18/2016  . Other fatigue 10/18/2016  . Hair thinning 10/18/2016  . Rectal bleeding 07/21/2016  . Chronic diarrhea 07/21/2016  . Diarrhea 07/14/2016  . Bloating 07/14/2016  . Myalgia 06/17/2016  . Primary osteoarthritis of both knees 06/17/2016  . S/P panniculectomy 11/18/2014  . Abdominal pain 01/08/2009  . NAUSEA WITH VOMITING 01/08/2009  . TROCHANTERIC BURSITIS, RIGHT 12/12/2008  . ALLERGIC REACTION 10/11/2008    Past Medical History:  Diagnosis Date  . Diabetes mellitus without complication ([place])    weight controlled  . Fibroid, uterine   . Headache(784.0)   . Herpes   . Hypertension   . Hypothyroidism     Family History  Problem Relation Age of Onset  . Hypertension Other   . Diabetes Other   .  Hypertension Mother   . Diabetes Mother   . Dementia Mother   . Asthma Son   . Eczema Son   . Asthma Daughter   . Eczema Daughter   . Colon cancer Cousin        mat cousins father  . Hypertension Brother   . Hypertension Brother   . Anesthesia problems Neg Hx   .  Hypotension Neg Hx   . Malignant hyperthermia Neg Hx   . Pseudochol deficiency Neg Hx   . Angioedema Neg Hx   . Atopy Neg Hx   . Immunodeficiency Neg Hx   . Urticaria Neg Hx    Past Surgical History:  Procedure Laterality Date  . CHOLECYSTECTOMY    . COLONOSCOPY    . KNEE SURGERY    . LIPOSUCTION N/A 11/18/2014   Procedure: LIPOSUCTION;  Surgeon: Cristine Polio, MD;  Location: [facility];  Service: Plastics;  Laterality: N/A;  . PANNICULECTOMY  11/18/2014   with repair / diastasis recti  . PANNICULECTOMY N/A 11/18/2014   Procedure: PANNICULECTOMY WITH REPAIR OF DIASTASIS RECTI ;  Surgeon: Cristine Polio, MD;  Location: [facility];  Service: Plastics;  Laterality: N/A;  . POLYPECTOMY    . THYROID SURGERY    . TUBAL LIGATION    . VAGINAL HYSTERECTOMY     Social History   Social History Narrative   Fun/Hobby: No hobbies   Immunization History  Administered Date(s) Administered  . Influenza Whole 05/03/2008  . Pneumococcal Polysaccharide-23 11/19/2014     Objective: Vital Signs: BP (!) 141/100 (BP Location: Right Wrist, Patient Position: Sitting, Cuff Size: Normal)   Pulse 89   Resp 13   Ht 5\' 3"  (1.6 m)   Wt 192 lb (87.1 kg)   BMI 34.01 kg/m    Physical Exam Vitals signs and nursing note reviewed.  Constitutional:      Appearance: She is well-developed.  HENT:     Head: Normocephalic and atraumatic.  Eyes:     Conjunctiva/sclera: Conjunctivae normal.  Neck:     Musculoskeletal: Normal range of motion.  Cardiovascular:     Rate and Rhythm: Normal rate and regular rhythm.     Heart sounds: Normal heart sounds.  Pulmonary:     Effort: Pulmonary effort is normal.     Breath sounds: Normal breath sounds.  Abdominal:     General: Bowel sounds are normal.     Palpations: Abdomen is soft.  Lymphadenopathy:     Cervical: No cervical adenopathy.  Skin:    General: Skin is warm and dry.     Capillary Refill: Capillary refill takes less than 2 seconds.  Neurological:      Mental Status: She is alert and oriented to person, place, and time.  Psychiatric:        Behavior: Behavior normal.      Musculoskeletal Exam: C-spine, thoracic spine, and lumbar spine good ROM.  No midline spinal tenderness.  No SI joint tenderness.  Shoulder joints, elbow joints, wrist joints, MCPs, PIPs, and DIPs good ROM with no synovitis.  Tenderness overlying both forearms.  Hip joints, knee joints, ankle joints, MTPs, PIPs, and DIPs good ROM with no synovitis.  No warmth or effusion of knee joints.  No tenderness or swelling of ankle joints. Bunions bilaterally.    CDAI Exam: CDAI Score: - Patient Global: -; Provider Global: - Swollen: -; Tender: - Joint Exam   No joint exam has been documented for this visit   There is currently no information  documented on the homunculus. Go to the Rheumatology activity and complete the homunculus joint exam.  Investigation: No additional findings.  Imaging: No results found.  Recent Labs: Lab Results  Component Value Date   WBC 5.3 10/25/2017   HGB 15.1 10/25/2017   PLT 268 10/25/2017   NA 135 10/25/2017   K 4.2 10/25/2017   CL 99 10/25/2017   CO2 26 10/25/2017   GLUCOSE 391 (H) 10/25/2017   BUN 10 10/25/2017   CREATININE 0.85 10/25/2017   BILITOT 0.4 10/25/2017   ALKPHOS 83 10/20/2016   AST 17 10/25/2017   ALT 22 10/25/2017   PROT 6.7 10/25/2017   ALBUMIN 3.8 10/20/2016   CALCIUM 8.9 10/25/2017   GFRAA 97 10/25/2017    Speciality Comments: PLQ Eye Exam: 05/16/17 WNL @ [facility]. Follow up in 1 year  Procedures:  No procedures performed Allergies: Canagliflozin, Corn starch, Empagliflozin, Gluten meal, and Metformin   Assessment / Plan:     Visit Diagnoses: Autoimmune disease ([place]) - history of positive ANA with negative titer, positive RNP,which is been persistently high. fatigue and hair thinning: She has not had any signs or symptoms of a flare recently.  She discontinued Plaquenil in December 2019  per recommendation of PCP according to the patient.  She has not developed any new or worsening symptoms since discontinuing Plaquenil and does not want to restart at this time.  She does not have any clinical features of autoimmune disease and will not require PLQ at this time.  No synovitis was noted on exam.  She has not had any recent rashes.  No sores in her mouth or nose.  She has not had any symptoms of Raynaud's recently.  She continues to have some hair thinning.  She has chronic fatigue related to insomnia.  She has not had any recent swollen lymph nodes or fevers.  We will check autoimmune lab work today.  She was advised to notify us if she has any new or worsening symptoms.  She will hold off on restarting Plaquenil at this time.  She will follow-up in the in 6 months.- Plan: ANA, Urinalysis, Routine w reflex microscopic, Anti-DNA antibody, double-stranded, C3 and C4, COMPLETE METABOLIC PANEL WITH GFR, CBC with Differential/Platelet, VITAMIN D 25 Hydroxy (Vit-D Deficiency, Fractures), Sedimentation rate, RNP Antibody  High risk medication use -She discontinued PLQ in December 2019.  According to the patient her PCP recommended that she discontinue. She does not clinically need PLQ at this time. We will continue to monitor lab work yearly.  eye exam: 05/16/2017    Primary osteoarthritis of both knees - No warmth or effusion.  She has good ROM with no discomfort.    Chondromalacia patellae, right knee - She has no right knee joint pain at this time.   Other fatigue - Chronic and related to insomnia.   Hair thinning - She continues to experience hair thinning.  Her hair thinning did not improve while on PLQ.  Trochanteric bursitis of both hips -Resolved.    History of vitamin D deficiency - We will check a vitamin D level today. Plan: VITAMIN D 25 Hydroxy (Vit-D Deficiency, Fractures)  Other medical conditions are listed as follows:  History of hypertension   History of diabetes  mellitus  History of gluten intolerance   Orders: Orders Placed This Encounter  Procedures  . ANA  . Urinalysis, Routine w reflex microscopic  . Anti-DNA antibody, double-stranded  . C3 and C4  . COMPLETE METABOLIC PANEL  WITH GFR  . CBC with Differential/Platelet  . VITAMIN D 25 Hydroxy (Vit-D Deficiency, Fractures)  . Sedimentation rate  . RNP Antibody   No orders of the defined types were placed in this encounter.     Follow-Up Instructions: Return in about 6 months (around 08/01/2019) for Autoimmune Disease, Osteoarthritis.   Ofilia Neas, PA-C   I examined and evaluated the patient with Hazel Sams PA.  Patient was clinically doing well today.  I did not notice any synovitis on examination.  She has some tenderness over the muscle groups which appears to be myofascial.  I will obtain autoimmune antibodies to see if her disease is still active. The plan of care was discussed as noted above.  Bo Merino, MD  Note - This record has been created using Editor, commissioning.  Chart creation errors have been sought, but may not always  have been located. Such creation errors do not reflect on  the standard of medical care.

## 2019-01-31 LAB — CBC WITH DIFFERENTIAL/PLATELET
Absolute Monocytes: 395 cells/uL (ref 200–950)
Basophils Absolute: 21 cells/uL (ref 0–200)
Basophils Relative: 0.4 %
Eosinophils Absolute: 31 cells/uL (ref 15–500)
Eosinophils Relative: 0.6 %
HCT: 44.5 % (ref 35.0–45.0)
Hemoglobin: 15 g/dL (ref 11.7–15.5)
Lymphs Abs: 2714 cells/uL (ref 850–3900)
MCH: 30.4 pg (ref 27.0–33.0)
MCHC: 33.7 g/dL (ref 32.0–36.0)
MCV: 90.3 fL (ref 80.0–100.0)
MPV: 10.9 fL (ref 7.5–12.5)
Monocytes Relative: 7.6 %
Neutro Abs: 2038 cells/uL (ref 1500–7800)
Neutrophils Relative %: 39.2 %
Platelets: 255 10*3/uL (ref 140–400)
RBC: 4.93 10*6/uL (ref 3.80–5.10)
RDW: 12.2 % (ref 11.0–15.0)
Total Lymphocyte: 52.2 %
WBC: 5.2 10*3/uL (ref 3.8–10.8)

## 2019-01-31 LAB — URINALYSIS, ROUTINE W REFLEX MICROSCOPIC
Bilirubin Urine: NEGATIVE
Hgb urine dipstick: NEGATIVE
Ketones, ur: NEGATIVE
Leukocytes,Ua: NEGATIVE
Nitrite: NEGATIVE
Protein, ur: NEGATIVE
Specific Gravity, Urine: 1.03 (ref 1.001–1.03)
pH: <=5 (ref 5.0–8.0)

## 2019-01-31 LAB — VITAMIN D 25 HYDROXY (VIT D DEFICIENCY, FRACTURES): Vit D, 25-Hydroxy: 23 ng/mL — ABNORMAL LOW (ref 30–100)

## 2019-01-31 LAB — COMPLETE METABOLIC PANEL WITH GFR
AG Ratio: 1.6 (calc) (ref 1.0–2.5)
ALT: 12 U/L (ref 6–29)
AST: 11 U/L (ref 10–35)
Albumin: 4.2 g/dL (ref 3.6–5.1)
Alkaline phosphatase (APISO): 82 U/L (ref 31–125)
BUN: 16 mg/dL (ref 7–25)
CO2: 27 mmol/L (ref 20–32)
Calcium: 9.1 mg/dL (ref 8.6–10.2)
Chloride: 103 mmol/L (ref 98–110)
Creat: 0.65 mg/dL (ref 0.50–1.10)
GFR, Est African American: 124 mL/min/{1.73_m2} (ref >=60)
GFR, Est Non African American: 107 mL/min/{1.73_m2} (ref >=60)
Globulin: 2.7 g/dL (calc) (ref 1.9–3.7)
Glucose, Bld: 257 mg/dL — ABNORMAL HIGH (ref 65–99)
Potassium: 4.9 mmol/L (ref 3.5–5.3)
Sodium: 136 mmol/L (ref 135–146)
Total Bilirubin: 0.4 mg/dL (ref 0.2–1.2)
Total Protein: 6.9 g/dL (ref 6.1–8.1)

## 2019-01-31 LAB — ANTI-DNA ANTIBODY, DOUBLE-STRANDED: ds DNA Ab: <1 IU/mL

## 2019-01-31 LAB — C3 AND C4
C3 Complement: 179 mg/dL (ref 83–193)
C4 Complement: 46 mg/dL (ref 15–57)

## 2019-01-31 LAB — RNP ANTIBODY: Ribonucleic Protein(ENA) Antibody, IgG: 3.8 AI — AB

## 2019-01-31 LAB — SEDIMENTATION RATE: Sed Rate: 10 mm/h (ref 0–20)

## 2019-01-31 LAB — ANA: Anti Nuclear Antibody (ANA): NEGATIVE

## 2019-01-31 NOTE — Progress Notes (Signed)
Elevated glucose and low vitamin D noted.  She should take vitamin D 50,000 units once a week for 3 months.  Repeat vitamin D in 3 months.

## 2019-02-01 ENCOUNTER — Telehealth: Payer: Self-pay | Admitting: *Deleted

## 2019-02-01 DIAGNOSIS — Z8639 Personal history of other endocrine, nutritional and metabolic disease: Secondary | ICD-10-CM

## 2019-02-01 DIAGNOSIS — M359 Systemic involvement of connective tissue, unspecified: Secondary | ICD-10-CM

## 2019-02-01 MED ORDER — VITAMIN D (ERGOCALCIFEROL) 1.25 MG (50000 UNIT) PO CAPS: 50000.0000 [IU] | ORAL_CAPSULE | ORAL | 0 refills | Status: AC

## 2019-02-01 NOTE — Progress Notes (Signed)
Labs are stable.  RNP is a still positive.  She decided to discontinue Plaquenil.  We will continue to monitor labs.  If her symptoms get worse she should notify us.  Glucose is elevated.  Please notify patient and fax results to her PCP.

## 2019-02-01 NOTE — Telephone Encounter (Signed)
-----   Message from Bo Merino, MD sent at 01/31/2019  1:01 PM EDT ----- Elevated glucose and low vitamin D noted.  She should take vitamin D 50,000 units once a week for 3 months.  Repeat vitamin D in 3 months.

## 2019-02-05 ENCOUNTER — Telehealth: Payer: Self-pay | Admitting: *Deleted

## 2019-02-05 NOTE — Telephone Encounter (Signed)
-----   Message from Bo Merino, MD sent at 02/05/2019 10:00 AM EDT ----- I tried to call patient but there was no response.  Please advise her that she may restart Plaquenil at 1 tablet a day.

## 2019-02-05 NOTE — Progress Notes (Signed)
I tried to call patient but there was no response.  Please advise her that she may restart Plaquenil at 1 tablet a day.

## 2019-05-25 ENCOUNTER — Other Ambulatory Visit: Payer: Self-pay | Admitting: Rheumatology

## 2019-05-25 NOTE — Telephone Encounter (Addendum)
Last visit: 01/30/19 Next Visit: 07/31/19  Labs: 01/30/19 Elevated glucose  PLQ Eye Exam: 05/16/17 WNL  Left message to advise patient we need update PLQ eye exam.  Okay to refill 30 day supply PLQ?

## 2019-05-25 NOTE — Telephone Encounter (Signed)
Patient called requesting prescription refill of Plaquenil to be sent to [facility] at [address].

## 2019-05-29 MED ORDER — HYDROXYCHLOROQUINE SULFATE 200 MG PO TABS: 200.0000 mg | ORAL_TABLET | Freq: Every day | ORAL | 0 refills | Status: DC

## 2019-05-29 NOTE — Telephone Encounter (Signed)
Ok to refill 30 day supply.  Please advise patient to schedule eye exam ASAP.

## 2019-07-09 ENCOUNTER — Other Ambulatory Visit: Payer: Self-pay

## 2019-07-09 ENCOUNTER — Telehealth (INDEPENDENT_AMBULATORY_CARE_PROVIDER_SITE_OTHER): Payer: Commercial Managed Care - PPO | Admitting: Rheumatology

## 2019-07-09 ENCOUNTER — Encounter: Payer: Self-pay | Admitting: Rheumatology

## 2019-07-09 DIAGNOSIS — M2241 Chondromalacia patellae, right knee: Secondary | ICD-10-CM | POA: Diagnosis not present

## 2019-07-09 DIAGNOSIS — Z79899 Other long term (current) drug therapy: Secondary | ICD-10-CM

## 2019-07-09 DIAGNOSIS — Z8719 Personal history of other diseases of the digestive system: Secondary | ICD-10-CM

## 2019-07-09 DIAGNOSIS — M359 Systemic involvement of connective tissue, unspecified: Secondary | ICD-10-CM | POA: Diagnosis not present

## 2019-07-09 DIAGNOSIS — Z8679 Personal history of other diseases of the circulatory system: Secondary | ICD-10-CM

## 2019-07-09 DIAGNOSIS — R5383 Other fatigue: Secondary | ICD-10-CM | POA: Diagnosis not present

## 2019-07-09 DIAGNOSIS — M170 Bilateral primary osteoarthritis of knee: Secondary | ICD-10-CM

## 2019-07-09 DIAGNOSIS — M7061 Trochanteric bursitis, right hip: Secondary | ICD-10-CM

## 2019-07-09 DIAGNOSIS — L659 Nonscarring hair loss, unspecified: Secondary | ICD-10-CM

## 2019-07-09 DIAGNOSIS — M7062 Trochanteric bursitis, left hip: Secondary | ICD-10-CM

## 2019-07-09 DIAGNOSIS — Z8639 Personal history of other endocrine, nutritional and metabolic disease: Secondary | ICD-10-CM

## 2019-07-09 NOTE — Progress Notes (Signed)
Virtual Visit via Telephone Note  I connected with Angela Cisneros on 07/09/19 at  1:15 PM EST by telephone and verified that I am speaking with the correct person using two identifiers.  Location: Patient: Home  Provider: Clinic  This service was conducted via virtual visit.  The patient was located at home. I was located in my office.  Consent was obtained prior to the virtual visit and is aware of possible charges through their insurance for this visit.  The patient is an established patient.  Dr. Estanislado Pandy, MD conducted the virtual visit and Hazel Sams, PA-C acted as scribe during the service.  Office staff helped with scheduling follow up visits after the service was conducted.   I discussed the limitations, risks, security and privacy concerns of performing an evaluation and management service by telephone and the availability of in person appointments. I also discussed with the patient that there may be a patient responsible charge related to this service. The patient expressed understanding and agreed to proceed.  CC: Arthralgias  History of Present Illness: Patient is a 48 year old female with a past medical history of autoimmune disease. She has been off of plaquenil 200 mg 1 tablet by mouth daily since June 2020.  She is unable to get an eye exam appointment until 2021.  She is also due to update lab work. She is having increased arthralgias with the cooler weather but denies any joint swelling.  She rarely has oral ulcerations.  She has chronic mouth dryness but no eye dryness.  She denies any symptoms of Raynaud's.  She has chronic fatigue related to insomnia.   Review of Systems  Constitutional: Positive for malaise/fatigue. Negative for fever.  HENT:       +Oral ulcers +Dry mouth  Eyes: Negative for photophobia, pain, discharge and redness.  Respiratory: Negative for cough, shortness of breath and wheezing.   Cardiovascular: Negative for chest pain and palpitations.   Gastrointestinal: Negative for blood in stool, constipation and diarrhea.  Genitourinary: Negative for dysuria.  Musculoskeletal: Positive for joint pain. Negative for back pain, myalgias and neck pain.       +Morning stiffness   Skin: Negative for rash.  Neurological: Negative for dizziness and headaches.  Psychiatric/Behavioral: Negative for depression. The patient is not nervous/anxious and does not have insomnia.       Observations/Objective: Physical Exam  Constitutional: She is oriented to person, place, and time.  Neurological: She is alert and oriented to person, place, and time.  Psychiatric: Mood, memory, affect and judgment normal.   Patient reports morning stiffness for 5 minutes.   Patient denies nocturnal pain.  Difficulty dressing/grooming: Denies Difficulty climbing stairs: Denies Difficulty getting out of chair: Denies Difficulty using hands for taps, buttons, cutlery, and/or writing: Denies   Assessment and Plan: Visit Diagnoses: Autoimmune disease ([place]) - history of positive ANA with negative titer, positive RNP,which is been persistently high. fatigue and hair thinning: She has not had any signs or symptoms of a flare recently.  She has not been taking Plaquenil since June 2020 due to need to update her PLQ exam. She is also due to update lab work.  She has been having increased arthralgias but no joint swelling.  She rarely has oral ulcerations but has chronic mouth dryness.  She has not had any recent rashes or photosensitivity.  No symptoms of Raynaud's.  She denies any shortness of breath or chest pain recently.  She will restart taking plaquenil 200 mg 1  tablet by mouth daily.  We will check autoimmune lab work.  She was advised to notify us if she develops any new or worsening symptoms.  She will follow up in 3 months.    High risk medication use: Plaquenil 200 mg 1 tablet by mouth daily. CBC and CMP were drawn on 01/30/19.  She is due to update lab work. She is  planning on having lab work drawn today.  She is overdue to update eye exam, but the soonest appointment she could get is in 2021.  We will send in a 30 day supply of PLQ today until she has her eye exam performed.   Primary osteoarthritis of both knees - She experiences stiffness in both knee joints.  She is not having any joint swelling at this time.  She has no difficulty climbing steps or getting up from a chair.   Chondromalacia patellae, right knee - She experiences stiffness in the right knee joint.    Other fatigue - Chronic and related to insomnia.   Hair thinning - She continues to experience hair thinning.  Her hair thinning did not improve while on PLQ.  Trochanteric bursitis of both hips: She has persistent trochanteric bursitis bilaterally.  She was encouraged to perform stretching exercises daily.   History of vitamin D deficiency - Vitamin D was low-23 on 01/30/19.  We will recheck vitamin D level today.  Other medical conditions are listed as follows:  History of hypertension   History of diabetes mellitus  History of gluten intolerance   Follow Up Instructions: She will follow up in 3 months.   I discussed the assessment and treatment plan with the patient. The patient was provided an opportunity to ask questions and all were answered. The patient agreed with the plan and demonstrated an understanding of the instructions.   The patient was advised to call back or seek an in-person evaluation if the symptoms worsen or if the condition fails to improve as anticipated.  I provided 15 minutes of non-face-to-face time during this encounter.   Bo Merino, MD   Scribed by-  Hazel Sams, PA-C

## 2019-07-10 ENCOUNTER — Other Ambulatory Visit: Payer: Self-pay | Admitting: *Deleted

## 2019-07-10 ENCOUNTER — Ambulatory Visit: Payer: BLUE CROSS/BLUE SHIELD | Admitting: Rheumatology

## 2019-07-10 MED ORDER — HYDROXYCHLOROQUINE SULFATE 200 MG PO TABS: 200.0000 mg | ORAL_TABLET | Freq: Every day | ORAL | 0 refills | Status: DC

## 2019-07-23 ENCOUNTER — Telehealth: Payer: Self-pay | Admitting: Rheumatology

## 2019-07-23 NOTE — Telephone Encounter (Signed)
LMOM for patient to call and schedule 3 month follow-up appointment (around 10/07/2019)

## 2019-07-23 NOTE — Telephone Encounter (Signed)
-----   Message from Shona Needles, RT sent at 07/10/2019  2:07 PM EST ----- Regarding: 3 MONTH F/U

## 2019-07-31 ENCOUNTER — Ambulatory Visit: Payer: BLUE CROSS/BLUE SHIELD | Admitting: Physician Assistant

## 2019-08-15 ENCOUNTER — Ambulatory Visit: Payer: Commercial Managed Care - PPO | Attending: Internal Medicine

## 2019-09-19 ENCOUNTER — Other Ambulatory Visit: Payer: Self-pay | Admitting: Family Medicine

## 2019-09-19 DIAGNOSIS — Z20822 Contact with and (suspected) exposure to covid-19: Secondary | ICD-10-CM

## 2019-09-25 ENCOUNTER — Ambulatory Visit: Payer: Commercial Managed Care - PPO | Attending: Internal Medicine

## 2019-09-25 DIAGNOSIS — Z20822 Contact with and (suspected) exposure to covid-19: Secondary | ICD-10-CM

## 2019-09-26 LAB — NOVEL CORONAVIRUS, NAA: SARS-CoV-2, NAA: NOT DETECTED

## 2019-09-27 ENCOUNTER — Encounter: Payer: Commercial Managed Care - PPO | Admitting: Pharmacist

## 2019-10-01 ENCOUNTER — Ambulatory Visit: Payer: Commercial Managed Care - PPO | Admitting: Pharmacist

## 2019-10-09 ENCOUNTER — Ambulatory Visit: Payer: Commercial Managed Care - PPO | Admitting: Pharmacist

## 2019-10-09 ENCOUNTER — Telehealth: Payer: Self-pay | Admitting: Pharmacy Technician

## 2019-10-09 NOTE — Telephone Encounter (Signed)
[employer] Patient Teacher, English as a foreign language completed.    The patient is insured, however the plan will not cover needed specialty medications (covers maintenance medications only) and will need patient assistance for medication (NDC not covered for [employer], we can try a prior authorization).  We can complete the application and will need to meet with the patient for signatures and income documentation.  Venida Jarvis. Nadara Mustard [place] Patient [facility] Phone: [phone] Fax:  [phone]

## 2020-04-22 ENCOUNTER — Telehealth: Payer: Self-pay | Admitting: Rheumatology

## 2020-04-22 NOTE — Telephone Encounter (Signed)
Patient's PCP is now Dr. Lin Landsman. Please send recent labs, and medication list. Patient there now. Fax# [phone]

## 2020-04-22 NOTE — Telephone Encounter (Signed)
Faxed requested information.

## 2020-08-08 ENCOUNTER — Other Ambulatory Visit: Payer: Self-pay

## 2020-08-08 ENCOUNTER — Ambulatory Visit (HOSPITAL_COMMUNITY)
Admission: EM | Admit: 2020-08-08 | Discharge: 2020-08-08 | Disposition: A | Discharge status: Home / Self Care | Payer: No Payment, Other | Attending: Psychiatry | Admitting: Psychiatry

## 2020-08-08 DIAGNOSIS — F4321 Adjustment disorder with depressed mood: Secondary | ICD-10-CM | POA: Insufficient documentation

## 2020-08-08 DIAGNOSIS — F430 Acute stress reaction: Secondary | ICD-10-CM | POA: Diagnosis not present
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
## 2020-08-08 NOTE — ED Notes (Signed)
Discharge instructions provided and Pt stated understanding. Personal belongings returned from locker. Pt alert, orient and ambulatory. Safety maintained.

## 2020-08-08 NOTE — Discharge Instructions (Signed)
You are encouraged to follow up with [facility] (upstairs) for outpatient therapy.  [place], [place]  Open access hours:  To see therapist and psychiatrist: Monday-Thursday mornings from 8a-11a To see therapist only: Fridays from 1pm-4pm

## 2020-08-14 ENCOUNTER — Telehealth (HOSPITAL_COMMUNITY): Payer: Self-pay | Admitting: Family Medicine

## 2020-08-14 NOTE — Telephone Encounter (Signed)
Care Management - Follow Up [facility] Discharges   Writer attempted to make contact with patient today and was unsuccessful.  Writer was able to leave a HIPPA compliant voice message and will await callback.   

## 2022-01-13 ENCOUNTER — Encounter: Payer: Self-pay | Admitting: Gastroenterology

## 2022-01-13 ENCOUNTER — Ambulatory Visit: Payer: Commercial Managed Care - HMO | Admitting: Gastroenterology

## 2022-01-13 ENCOUNTER — Other Ambulatory Visit (INDEPENDENT_AMBULATORY_CARE_PROVIDER_SITE_OTHER): Payer: Commercial Managed Care - HMO

## 2022-01-13 VITALS — BP 94/70 | HR 90 | Ht 63.0 in | Wt 180.1 lb

## 2022-01-13 DIAGNOSIS — R1084 Generalized abdominal pain: Secondary | ICD-10-CM | POA: Diagnosis not present

## 2022-01-13 DIAGNOSIS — K529 Noninfective gastroenteritis and colitis, unspecified: Secondary | ICD-10-CM | POA: Diagnosis not present

## 2022-01-13 LAB — CBC
HCT: 44 % (ref 36.0–46.0)
Hemoglobin: 14.8 g/dL (ref 12.0–15.0)
MCHC: 33.5 g/dL (ref 30.0–36.0)
MCV: 90.9 fl (ref 78.0–100.0)
Platelets: 268 10*3/uL (ref 150.0–400.0)
RBC: 4.85 Mil/uL (ref 3.87–5.11)
RDW: 12.9 % (ref 11.5–15.5)
WBC: 6.7 10*3/uL (ref 4.0–10.5)

## 2022-01-13 LAB — COMPREHENSIVE METABOLIC PANEL
ALT: 20 U/L (ref 0–35)
AST: 15 U/L (ref 0–37)
Albumin: 4.2 g/dL (ref 3.5–5.2)
Alkaline Phosphatase: 91 U/L (ref 39–117)
BUN: 14 mg/dL (ref 6–23)
CO2: 28 mEq/L (ref 19–32)
Calcium: 9.1 mg/dL (ref 8.4–10.5)
Chloride: 100 mEq/L (ref 96–112)
Creatinine, Ser: 0.62 mg/dL (ref 0.40–1.20)
GFR: 105.04 mL/min (ref >60.00)
Glucose, Bld: 178 mg/dL — ABNORMAL HIGH (ref 70–99)
Potassium: 3.9 mEq/L (ref 3.5–5.1)
Sodium: 136 mEq/L (ref 135–145)
Total Bilirubin: 0.4 mg/dL (ref 0.2–1.2)
Total Protein: 7.3 g/dL (ref 6.0–8.3)

## 2022-01-13 LAB — TSH: TSH: 0.79 u[IU]/mL (ref 0.35–5.50)

## 2022-01-13 MED ORDER — METOCLOPRAMIDE HCL 10 MG PO TABS: 10.0000 mg | ORAL_TABLET | Freq: Three times a day (TID) | ORAL | 0 refills | Status: DC | PRN

## 2022-01-13 MED ORDER — HYOSCYAMINE SULFATE 0.125 MG SL SUBL: 0.1250 mg | SUBLINGUAL_TABLET | Freq: Four times a day (QID) | SUBLINGUAL | 2 refills | Status: DC | PRN

## 2022-01-13 NOTE — Patient Instructions (Signed)
If you are age 49 or younger, your body mass index should be between 19-25. Your Body mass index is 31.91 kg/m. If this is out of the aformentioned range listed, please consider follow up with your Primary Care Provider.  ________________________________________________________  The [place] [facility] providers would like to encourage you to use [facility] to communicate with providers for non-urgent requests or questions.  Due to long hold times on the telephone, sending your provider a message by [facility] may be a faster and more efficient way to get a response.  Please allow 48 business hours for a response.  Please remember that this is for non-urgent requests.  _______________________________________________________  Your provider has requested that you go to the basement level for lab work before leaving today. Press "B" on the elevator. The lab is located at the first door on the left as you exit the elevator.  You have been scheduled for a CT scan of the abdomen and pelvis at [facility], 1st floor Radiology. You are scheduled on 01-21-22  at 12:30pm. You should arrive 15 minutes prior to your appointment time for registration.  We have given you two bottles of contrast. The solution may taste better if refrigerated, but do NOT add ice or any other liquid to this solution. Shake well before drinking.   Please follow the written instructions below on the day of your exam:   1) Do not eat anything after 8:30am (4 hours prior to your test)   2) Drink 1 bottle of contrast @ 10:30am (2 hours prior to your exam)  Remember to shake well before drinking and do NOT pour over ice.     Drink 1 bottle of contrast @ 11:30am (1 hour prior to your exam)   You may take any medications as prescribed with a small amount of water, if necessary. If you take any of the following medications: METFORMIN, GLUCOPHAGE, GLUCOVANCE, AVANDAMET, RIOMET, FORTAMET, [place] MET, JANUMET, GLUMETZA or METAGLIP, you MAY be asked  to HOLD this medication 48 hours AFTER the exam.   The purpose of you drinking the oral contrast is to aid in the visualization of your intestinal tract. The contrast solution may cause some diarrhea. Depending on your individual set of symptoms, you may also receive an intravenous injection of x-ray contrast/dye. Plan on being at [facility] for 45 minutes or longer, depending on the type of exam you are having performed.   If you have any questions regarding your exam or if you need to reschedule, you may call [facility] at [phone] between the hours of 8:00 am and 5:00 pm, Monday-Friday.   You have been scheduled for an endoscopy. Please follow written instructions given to you at your visit today. If you use inhalers (even only as needed), please bring them with you on the day of your procedure.  Due to recent changes in healthcare laws, you may see the results of your imaging and laboratory studies on MyChart before your provider has had a chance to review them.  We understand that in some cases there may be results that are confusing or concerning to you. Not all laboratory results come back in the same time frame and the provider may be waiting for multiple results in order to interpret others.  Please give [place] 48 hours in order for your provider to thoroughly review all the results before contacting the office for clarification of your results.   We have sent the following medications to your pharmacy for  you to pick up at your convenience:  START: Reglan 27m every 8 hours as needed.  START: Levsin 0.229mevery 6 hours as needed.  Thank you for entrusting me with your care and choosing [facility] BogusPA-C

## 2022-01-13 NOTE — Progress Notes (Signed)
01/13/2022 Angela Cisneros [id number] [birth year]   HISTORY OF PRESENT ILLNESS: This is a 51 year old female who is a patient of Dr. Woodward Ku.  She had been a patient of Dr. Lorie Apley previously, but came to [place] for second opinion back in 2017.  She has had GI symptoms dating back to probably 2014 (she says that it all started after her cholecystectomy).  Celiac labs have been negative.  She had colonoscopy in 2010 with removal of multiple colonic polyps, some tubular adenomas and some hyperplastic.  She had another colonoscopy in January 2015 with findings of scattered diverticula in the entire examined colon.  TI was intubated and the examined portions of the ileum were once again normal.  Colon biopsy showed benign colonic mucosa with increased intraepithelial lymphocytes.  Apparently she had tried prednisone but that did not help and also cause blood sugar elevation.  Pancreatic fecal elastase normal in 20118.  She has tried [place] and probiotics in the past.  She has tried Imodium and Lomotil in the past.  We repeated colonoscopy in February 2018 as below.  She has tried Bentyl but reported that it made the diarrhea worse.  Colonoscopy 08/2016:  - One 3 mm polyp in the sigmoid colon, removed with a cold snare. Resected and retrieved. - Diverticulosis in the left colon. - Normal mucosa in the entire examined colon. Biopsied. - Non-bleeding internal hemorrhoids.  1. Surgical [P], random sites - BENIGN COLONIC MUCOSA. - NO SIGNIFICANT INFLAMMATION OR OTHER ABNORMALITIES IDENTIFIED. 2. Surgical [P], sigmoid, polyp - HYPERPLASTIC POLYP(S). - THERE IS NO EVIDENCE OF MALIGNANCY.  She is now back here again today after a 4.5-year hiatus for evaluation of the same symptoms.  Has almost daily episodes of severe abdominal cramping that eventually she has to strain to have a BM but produces explosive diarrhea.  She gets sweats and nausea from the severe pain that she says comes and goes like  contractions.  She says that she will have a couple episodes of diarrhea, but once that is all done then she is fine throughout the rest of the day with no persistent symptoms until it occurs again the following day or whenever.  Referred back here again by her PCP, Dr. Ayesha Rumpf, for evaluation of abdominal cramping and diarrhea  Past Medical History:  Diagnosis Date   Diabetes mellitus without complication ([facility])    weight controlled   Fibroid, uterine    Headache(784.0)    Herpes    Hypertension    Hypothyroidism    Past Surgical History:  Procedure Laterality Date   CHOLECYSTECTOMY     COLONOSCOPY     KNEE SURGERY     LIPOSUCTION N/A 11/18/2014   Procedure: LIPOSUCTION;  Surgeon: Cristine Polio, MD;  Location: [facility];  Service: Plastics;  Laterality: N/A;   PANNICULECTOMY  11/18/2014   with repair / diastasis recti   PANNICULECTOMY N/A 11/18/2014   Procedure: PANNICULECTOMY WITH REPAIR OF DIASTASIS RECTI ;  Surgeon: Cristine Polio, MD;  Location: [facility];  Service: Plastics;  Laterality: N/A;   POLYPECTOMY     THYROID SURGERY     TUBAL LIGATION     VAGINAL HYSTERECTOMY      reports that she has never smoked. She has never used smokeless tobacco. She reports current alcohol use. She reports that she does not use drugs. family history includes Asthma in her daughter and son; Colon cancer in her cousin; Colon polyps in her mother; Dementia in her mother; Diabetes in her  daughter and mother; Eczema in her daughter and son; Hypertension in her brother, brother, mother, and another family member. Allergies  Allergen Reactions   Canagliflozin Other (See Comments)   Corn Starch Hives and Itching    Powder in gloves   Empagliflozin Other (See Comments)   Gluten Meal    Metformin Other (See Comments)      Outpatient Encounter Medications as of 01/13/2022  Medication Sig   atorvastatin (LIPITOR) 20 MG tablet Take 20 mg by mouth daily.   cholestyramine (QUESTRAN) 4 g packet Take 1  packet (4 g total) by mouth 2 (two) times daily.   diclofenac sodium (VOLTAREN) 1 % GEL Apply 4 g topically 4 (four) times daily.   HUMALOG MIX 75/25 KWIKPEN (75-25) 100 UNIT/ML Kwikpen INJECT 30 UNITS SQ BID   naloxone (NARCAN) nasal spray 4 mg/0.1 mL SMARTSIG:Both Nares   olmesartan-hydrochlorothiazide (BENICAR HCT) 40-12.5 MG tablet Take 1 tablet by mouth daily.   oxyCODONE-acetaminophen (PERCOCET) 10-325 MG tablet Take 1 tablet by mouth 3 (three) times daily.   TRULICITY 9.73 ZH/2.9JM SOPN INJECT 0.75 MG INTO THE SKIN Q 7 DAYS FOR DIABETES   valACYclovir (VALTREX) 500 MG tablet Take 500 mg by mouth as needed.    Vitamin D, Ergocalciferol, (DRISDOL) 1.25 MG (50000 UT) CAPS capsule Take 1 capsule (50,000 Units total) by mouth every 7 (seven) days.   [DISCONTINUED] hydroxychloroquine (PLAQUENIL) 200 MG tablet Take 1 tablet (200 mg total) by mouth daily. (Patient not taking: Reported on 01/13/2022)   [DISCONTINUED] loperamide (IMODIUM A-D) 2 MG tablet 2 mg. take one tab after each loose bowel movement    No facility-administered encounter medications on file as of 01/13/2022.    REVIEW OF SYSTEMS  : All other systems reviewed and negative except where noted in the History of Present Illness.   PHYSICAL EXAM: BP 94/70   Pulse 90   Ht '5\' 3"'$  (1.6 m)   Wt 180 lb 2 oz (81.7 kg)   BMI 31.91 kg/m  General: Well developed female in no acute distress Head: Normocephalic and atraumatic Eyes:  Sclerae anicteric, conjunctiva pink. Ears: Normal auditory acuity Lungs: Clear throughout to auscultation; no W/R/R. Heart: Regular rate and rhythm; no M/R/G. Abdomen: Soft, non-distended.  BS present.  Non-tender. Musculoskeletal: Symmetrical with no gross deformities  Skin: No lesions on visible extremities Extremities: No edema  Neurological: Alert oriented x 4, grossly non-focal Psychological:  Alert and cooperative. 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  She has had very dramatic symptoms over the years, but some of her treatment has been limited by her inability to swallow pills.  She has undergone a lot of work-up and has tried several treatment regimens.  Was last seen here in 2018.  2 or 3 colonoscopies negative.  She is currently on Questran twice a day.  Continues to report severe abdominal cramping followed by episodes of diarrhea almost daily.  No persistent pain between episodes.  She has tried Bentyl in the past, but I am going to have her try some sublingual hyoscyamine as needed.  Prescription sent to pharmacy.  She never had an EGD.  I think at this point it would be a good idea to look for any type of enteropathy.  She has been on Benicar for a few years.  Wonder if she could have sprue like enteropathy from that.  Will schedule EGD with Dr.  Nandigam.  She is asking about obstruction.  I think it is worth doing a CT scan of the abdomen and pelvis with contrast as she has not had one done in several years.  We will check a CBC, CMP, TSH today.  We will give her Reglan 5 mg to use as needed for nausea and she is also going to take a dose about 30 minutes prior to having to drink the CT scan contrast in hopes that she will be able to tolerate that well.  Cannot use Viberzi due to no gallbladder.  ? Lotronex if all else is negative.   CC:  Lin Landsman, MD

## 2022-01-19 ENCOUNTER — Telehealth: Payer: Self-pay | Admitting: Gastroenterology

## 2022-01-19 NOTE — Telephone Encounter (Signed)
Patient called states she was advise that the radiology center she was sent to are not in network with her insurance.

## 2022-01-19 NOTE — Telephone Encounter (Signed)
Spoke with patient and advised her to contact her insuruace to find out what facilities are in her network for imaging services.  Patient will contact insurance company and let me know.

## 2022-01-21 ENCOUNTER — Encounter (HOSPITAL_COMMUNITY): Payer: Self-pay

## 2022-01-21 ENCOUNTER — Ambulatory Visit (HOSPITAL_COMMUNITY): Payer: Commercial Managed Care - HMO

## 2022-02-16 ENCOUNTER — Encounter: Payer: Self-pay | Admitting: Gastroenterology

## 2022-02-16 ENCOUNTER — Ambulatory Visit (AMBULATORY_SURGERY_CENTER): Payer: Commercial Managed Care - HMO | Admitting: Gastroenterology

## 2022-02-16 VITALS — BP 142/89 | HR 84 | Temp 97.8°F | Resp 14 | Ht 63.0 in | Wt 180.0 lb

## 2022-02-16 DIAGNOSIS — R197 Diarrhea, unspecified: Secondary | ICD-10-CM

## 2022-02-16 DIAGNOSIS — K529 Noninfective gastroenteritis and colitis, unspecified: Secondary | ICD-10-CM

## 2022-02-16 DIAGNOSIS — R1310 Dysphagia, unspecified: Secondary | ICD-10-CM | POA: Diagnosis not present

## 2022-02-16 DIAGNOSIS — R1084 Generalized abdominal pain: Secondary | ICD-10-CM | POA: Diagnosis present

## 2022-02-16 MED ORDER — SODIUM CHLORIDE 0.9 % IV SOLN: 500.0000 mL | INTRAVENOUS | Status: DC

## 2022-02-16 NOTE — Op Note (Addendum)
[name] Procedure Date: 02/16/2022 9:44 AM MRN: [id number] Endoscopist: Mauri Pole , MD Age: 51 Referring MD:  Date of Birth: Sep 01, 1970 Gender: Female Account #: [id number] Procedure:                Upper GI endoscopy Indications:              Dysphagia, Generalized abdominal pain, Diarrhea Medicines:                Monitored Anesthesia Care Procedure:                Pre-Anesthesia Assessment:                           - Prior to the procedure, a History and Physical                            was performed, and patient medications and                            allergies were reviewed. The patient's tolerance of                            previous anesthesia was also reviewed. The risks                            and benefits of the procedure and the sedation                            options and risks were discussed with the patient.                            All questions were answered, and informed consent                            was obtained. Prior Anticoagulants: The patient has                            taken no previous anticoagulant or antiplatelet                            agents. ASA Grade Assessment: II - A patient with                            mild systemic disease. After reviewing the risks                            and benefits, the patient was deemed in                            satisfactory condition to undergo the procedure.                           After obtaining informed consent, the endoscope was  passed under direct vision. Throughout the                            procedure, the patient's blood pressure, pulse, and                            oxygen saturations were monitored continuously. The                            GIF [id number] #[id number] was introduced through the                            mouth, and advanced to the second part of duodenum.                             The upper GI endoscopy was technically difficult                            and complex due to presence of food. The patient                            tolerated the procedure well. Scope In: Scope Out: Findings:                 The Z-line was regular and was found 38 cm from the                            incisors.                           No gross lesions were noted in the entire esophagus.                           A small hiatal hernia was present.                           A large amount of food (residue) was found in the                            gastric body. Removal of food was accomplished.                           The exam of the stomach was otherwise normal.                           The examined duodenum was normal.                           The cardia and gastric fundus were normal on                            retroflexion. Complications:            No immediate complications. Estimated Blood Loss:     Estimated blood loss was minimal. Impression:               -  Z-line regular, 38 cm from the incisors.                           - No gross lesions in esophagus.                           - Small hiatal hernia.                           - A large amount of food (residue) in the stomach.                            Removal was successful.                           - Normal examined duodenum. Recommendation:           - Patient has a contact number available for                            emergencies. The signs and symptoms of potential                            delayed complications were discussed with the                            patient. Return to normal activities tomorrow.                            Written discharge instructions were provided to the                            patient.                           - Gastroparesis diet. Low fat diet.                           - Avoid chronic use of narcotics                           - Schedule 4 hour gastric emtying  scan off                            narcotics for atleast 48 hours                           - Continue present medications.                           - Follow an antireflux regimen.                           - Follow up in office visit next available                            appointment  in GI office in 4-6 weeks with APP Mauri Pole, MD 02/16/2022 10:06:56 AM This report has been signed electronically.

## 2022-02-16 NOTE — Progress Notes (Signed)
To pacu, VSS. Report to [email or web address] 

## 2022-02-16 NOTE — Progress Notes (Signed)
[facility] History and Physical   Primary Care Physician:  Lin Landsman, MD   Reason for Procedure:  Pill dysphagia , chronic diarrhea and Generalized abdominal pain  Plan:    EGD  with possible interventions as needed     HPI: Angela Cisneros is a very pleasant 51 y.o. female here for EGD for pill dysphagia , chronic diarrhea and generalized abdominal pain   The risks and benefits as well as alternatives of endoscopic procedure(s) have been discussed and reviewed. All questions answered. The patient agrees to proceed.    Past Medical History:  Diagnosis Date   Diabetes mellitus without complication ([facility])    weight controlled   Fibroid, uterine    Headache(784.0)    Herpes    Hypertension    Hypothyroidism     Past Surgical History:  Procedure Laterality Date   CHOLECYSTECTOMY     COLONOSCOPY     KNEE SURGERY     LIPOSUCTION N/A 11/18/2014   Procedure: LIPOSUCTION;  Surgeon: Cristine Polio, MD;  Location: [facility];  Service: Plastics;  Laterality: N/A;   PANNICULECTOMY  11/18/2014   with repair / diastasis recti   PANNICULECTOMY N/A 11/18/2014   Procedure: PANNICULECTOMY WITH REPAIR OF DIASTASIS RECTI ;  Surgeon: Cristine Polio, MD;  Location: [facility];  Service: Plastics;  Laterality: N/A;   POLYPECTOMY     THYROID SURGERY     TUBAL LIGATION     VAGINAL HYSTERECTOMY      Prior to Admission medications   Medication Sig Start Date End Date Taking? Authorizing Provider  atorvastatin (LIPITOR) 20 MG tablet Take 20 mg by mouth daily. 01/02/22  Yes [provider]  cholestyramine (QUESTRAN) 4 g packet Take 1 packet (4 g total) by mouth 2 (two) times daily. 02/17/17  Yes Zehr, Laban Emperor, PA-C  HUMALOG MIX 75/25 KWIKPEN (75-25) 100 UNIT/ML Kwikpen INJECT 30 UNITS SQ BID 12/13/18  Yes [provider]  metoCLOPramide (REGLAN) 10 MG tablet Take 1 tablet (10 mg total) by mouth every 8 (eight) hours as needed for nausea. 01/13/22  Yes Zehr, Laban Emperor, PA-C  olmesartan-hydrochlorothiazide (BENICAR HCT) 40-12.5 MG tablet Take 1 tablet by mouth daily. 09/15/17  Yes [provider]  TRULICITY 8.67 JQ/4.9EE SOPN INJECT 0.75 MG INTO THE SKIN Q 7 DAYS FOR DIABETES 07/30/17  Yes [provider]  Vitamin D, Ergocalciferol, (DRISDOL) 1.25 MG (50000 UT) CAPS capsule Take 1 capsule (50,000 Units total) by mouth every 7 (seven) days. 02/01/19  Yes Deveshwar, Abel Presto, MD  diclofenac sodium (VOLTAREN) 1 % GEL Apply 4 g topically 4 (four) times daily. 10/21/16   Bo Merino, MD  hyoscyamine (LEVSIN SL) 0.125 MG SL tablet Place 1 tablet (0.125 mg total) under the tongue every 6 (six) hours as needed. 01/13/22   Zehr, Laban Emperor, PA-C  naloxone [facility]) nasal spray 4 mg/0.1 mL SMARTSIG:Both Nares Patient not taking: Reported on 02/16/2022 01/02/22   [provider]  oxyCODONE-acetaminophen (PERCOCET) 10-325 MG tablet Take 1 tablet by mouth 3 (three) times daily. 01/07/22   [provider]  valACYclovir (VALTREX) 500 MG tablet Take 500 mg by mouth as needed.     [provider]  loperamide (IMODIUM A-D) 2 MG tablet 2 mg. take one tab after each loose bowel movement   07/14/11  [provider]    Current Outpatient Medications  Medication Sig Dispense Refill   atorvastatin (LIPITOR) 20 MG tablet Take 20 mg by mouth daily.     cholestyramine (  QUESTRAN) 4 g packet Take 1 packet (4 g total) by mouth 2 (two) times daily. 60 each 1   HUMALOG MIX 75/25 KWIKPEN (75-25) 100 UNIT/ML Kwikpen INJECT 30 UNITS SQ BID     metoCLOPramide (REGLAN) 10 MG tablet Take 1 tablet (10 mg total) by mouth every 8 (eight) hours as needed for nausea. 30 tablet 0   olmesartan-hydrochlorothiazide (BENICAR HCT) 40-12.5 MG tablet Take 1 tablet by mouth daily.  0   TRULICITY 7.62 UQ/3.3HL SOPN INJECT 0.75 MG INTO THE SKIN Q 7 DAYS FOR DIABETES  11   Vitamin D, Ergocalciferol, (DRISDOL) 1.25 MG (50000 UT) CAPS capsule Take 1 capsule (50,000  Units total) by mouth every 7 (seven) days. 12 capsule 0   diclofenac sodium (VOLTAREN) 1 % GEL Apply 4 g topically 4 (four) times daily. 5 Tube 1   hyoscyamine (LEVSIN SL) 0.125 MG SL tablet Place 1 tablet (0.125 mg total) under the tongue every 6 (six) hours as needed. 30 tablet 2   naloxone (NARCAN) nasal spray 4 mg/0.1 mL SMARTSIG:Both Nares (Patient not taking: Reported on 02/16/2022)     oxyCODONE-acetaminophen (PERCOCET) 10-325 MG tablet Take 1 tablet by mouth 3 (three) times daily.     valACYclovir (VALTREX) 500 MG tablet Take 500 mg by mouth as needed.      Current Facility-Administered Medications  Medication Dose Route Frequency Provider Last Rate Last Admin   0.9 %  sodium chloride infusion  500 mL Intravenous Continuous Jlee Harkless, Venia Minks, MD        Allergies as of 02/16/2022 - Review Complete 02/16/2022  Allergen Reaction Noted   Canagliflozin Other (See Comments) 05/03/2017   Corn starch Hives and Itching 11/12/2014   Empagliflozin Other (See Comments) 05/03/2017   Gluten meal  04/26/2017   Metformin Other (See Comments) 05/03/2017    Family History  Problem Relation Age of Onset   Colon polyps Mother    Hypertension Mother    Diabetes Mother    Dementia Mother    Hypertension Brother    Hypertension Brother    Asthma Daughter    Eczema Daughter    Diabetes Daughter    Asthma Son    Eczema Son    Colon cancer Cousin        mat cousins father   Hypertension Other    Anesthesia problems Neg Hx    Hypotension Neg Hx    Malignant hyperthermia Neg Hx    Pseudochol deficiency Neg Hx    Angioedema Neg Hx    Atopy Neg Hx    Immunodeficiency Neg Hx    Urticaria Neg Hx    Esophageal cancer Neg Hx    Pancreatic cancer Neg Hx    Stomach cancer Neg Hx    Heart disease Neg Hx     Social History   Socioeconomic History   Marital status: Widowed    Spouse name: Not on file   Number of children: 6   Years of education: 12   Highest education level: Not on file   Occupational History   Occupation: Recruitment consultant  Tobacco Use   Smoking status: Never   Smokeless tobacco: Never  Vaping Use   Vaping Use: Never used  Substance and Sexual Activity   Alcohol use: Yes    Comment: Occasionally   Drug use: No   Sexual activity: Yes    Partners: Male    Birth control/protection: Surgical  Other Topics Concern   Not on file  Social History Narrative  Fun/Hobby: No hobbies   Social Determinants of Radio broadcast assistant Strain: Not on file  Food Insecurity: Not on file  Transportation Needs: Not on file  Physical Activity: Not on file  Stress: Not on file  Social Connections: Not on file  Intimate Partner Violence: Not on file    Review of Systems:  All other review of systems negative except as mentioned in the HPI.  Physical Exam: Vital signs in last 24 hours: BP 130/68   Pulse 80   Temp 97.8 F (36.6 C)   Ht '5\' 3"'$  (1.6 m)   Wt 180 lb (81.6 kg)   SpO2 100%   BMI 31.89 kg/m  General:   Alert, NAD Lungs:  Clear .   Heart:  Regular rate and rhythm Abdomen:  Soft, nontender and nondistended. Neuro/Psych:  Alert and cooperative. Normal mood and affect. A and O x 3  Reviewed labs, radiology imaging, old records and pertinent past GI work up  Patient is appropriate for planned procedure(s) and anesthesia in an ambulatory setting   K. Denzil Magnuson , MD [phone]

## 2022-02-16 NOTE — Patient Instructions (Signed)
**  Handout given on Gastroparesis**   YOU HAD AN ENDOSCOPIC PROCEDURE TODAY AT [facility]:   Refer to the procedure report that was given to you for any specific questions about what was found during the examination.  If the procedure report does not answer your questions, please call your gastroenterologist to clarify.  If you requested that your care partner not be given the details of your procedure findings, then the procedure report has been included in a sealed envelope for you to review at your convenience later.  YOU SHOULD EXPECT: Some feelings of bloating in the abdomen. Passage of more gas than usual.  Walking can help get rid of the air that was put into your GI tract during the procedure and reduce the bloating. If you had a lower endoscopy (such as a colonoscopy or flexible sigmoidoscopy) you may notice spotting of blood in your stool or on the toilet paper. If you underwent a bowel prep for your procedure, you may not have a normal bowel movement for a few days.  Please Note:  You might notice some irritation and congestion in your nose or some drainage.  This is from the oxygen used during your procedure.  There is no need for concern and it should clear up in a day or so.  SYMPTOMS TO REPORT IMMEDIATELY:  Following upper endoscopy (EGD)  Vomiting of blood or coffee ground material  New chest pain or pain under the shoulder blades  Painful or persistently difficult swallowing  New shortness of breath  Fever of 100F or higher  Black, tarry-looking stools  For urgent or emergent issues, a gastroenterologist can be reached at any hour by calling [phone]. Do not use MyChart messaging for urgent concerns.    DIET:  We do recommend a small meal at first, but then you may proceed to your regular diet.  Drink plenty of fluids but you should avoid alcoholic beverages for 24 hours.  ACTIVITY:  You should plan to take it easy for the rest of today and you should  NOT DRIVE or use heavy machinery until tomorrow (because of the sedation medicines used during the test).    FOLLOW UP: Our staff will call the number listed on your records the next business day following your procedure.  We will call around 7:15- 8:00 am to check on you and address any questions or concerns that you may have regarding the information given to you following your procedure. If we do not reach you, we will leave a message.  If you develop any symptoms (ie: fever, flu-like symptoms, shortness of breath, cough etc.) before then, please call [phone].  If you test positive for Covid 19 in the 2 weeks post procedure, please call and report this information to [place].    If any biopsies were taken you will be contacted by phone or by letter within the next 1-3 weeks.  Please call us at [phone] if you have not heard about the biopsies in 3 weeks.    SIGNATURES/CONFIDENTIALITY: You and/or your care partner have signed paperwork which will be entered into your electronic medical record.  These signatures attest to the fact that that the information above on your After Visit Summary has been reviewed and is understood.  Full responsibility of the confidentiality of this discharge information lies with you and/or your care-partner.

## 2022-02-17 ENCOUNTER — Telehealth: Payer: Self-pay | Admitting: *Deleted

## 2022-02-17 NOTE — Telephone Encounter (Signed)
  Follow up Call-     02/16/2022    9:24 AM  Call back number  Post procedure Call Back phone  # [phone]  Permission to leave phone message Yes     Patient questions:  Do you have a fever, pain , or abdominal swelling? No. Pain Score  0 *  Have you tolerated food without any problems? Yes.    Have you been able to return to your normal activities? Yes.    Do you have any questions about your discharge instructions: Diet   No. Medications  No. Follow up visit  No.  Do you have questions or concerns about your Care? No.  Actions: * If pain score is 4 or above: No action needed, pain <4.

## 2022-10-11 ENCOUNTER — Ambulatory Visit
Admission: RE | Admit: 2022-10-11 | Discharge: 2022-10-11 | Disposition: A | Discharge status: Home / Self Care | Payer: Commercial Managed Care - HMO | Source: Ambulatory Visit | Attending: Family Medicine | Admitting: Family Medicine

## 2022-10-11 ENCOUNTER — Other Ambulatory Visit: Payer: Self-pay | Admitting: Family Medicine

## 2022-10-11 DIAGNOSIS — M25551 Pain in right hip: Secondary | ICD-10-CM

## 2022-12-13 ENCOUNTER — Telehealth: Payer: Self-pay | Admitting: Rheumatology

## 2022-12-13 NOTE — Telephone Encounter (Signed)
Patient left a voicemail stating she was reviewing her medication list on Mychart and isn't able to find where she was prescribed Plaquenil for her systemic lupus.  Patient requested a return call.

## 2022-12-13 NOTE — Telephone Encounter (Signed)
Please call patient. Patient needs info on how to obtain medical record with diagnosis and medications. Thank you.

## 2022-12-15 NOTE — Telephone Encounter (Signed)
Patient states she was able to view her medical records on Mychart.  Patient states Dr. Corliss Skains diagnosed her with Systemic Lupus and prescribed Plaquenil.  Patient states she is not able to find the diagnosis anywhere in the office notes.

## 2022-12-15 NOTE — Telephone Encounter (Signed)
LMOM, the diagnosis's in the patient's chart are the only conditions the patient has been diagnosed with from Dr.Deveshwar.

## 2023-09-15 ENCOUNTER — Other Ambulatory Visit (HOSPITAL_COMMUNITY): Payer: Self-pay

## 2023-09-15 ENCOUNTER — Other Ambulatory Visit (HOSPITAL_BASED_OUTPATIENT_CLINIC_OR_DEPARTMENT_OTHER): Payer: Self-pay

## 2023-09-15 MED ORDER — OXYCODONE HCL 15 MG PO TABS
15.0000 mg | ORAL_TABLET | Freq: Three times a day (TID) | ORAL | 0 refills | Status: DC
  Filled 2023-09-15 (×2): qty 90, 30d supply, fill #0

## 2023-10-05 ENCOUNTER — Other Ambulatory Visit (HOSPITAL_COMMUNITY): Payer: Self-pay

## 2023-10-05 MED ORDER — OXYCODONE HCL 15 MG PO TABS
15.0000 mg | ORAL_TABLET | Freq: Three times a day (TID) | ORAL | 0 refills | Status: DC
  Filled 2023-10-13: qty 90, 30d supply, fill #0

## 2023-10-06 ENCOUNTER — Other Ambulatory Visit (HOSPITAL_COMMUNITY): Payer: Self-pay

## 2023-10-13 ENCOUNTER — Other Ambulatory Visit (HOSPITAL_BASED_OUTPATIENT_CLINIC_OR_DEPARTMENT_OTHER): Payer: Self-pay

## 2023-10-13 ENCOUNTER — Other Ambulatory Visit (HOSPITAL_COMMUNITY): Payer: Self-pay

## 2023-11-09 ENCOUNTER — Other Ambulatory Visit (HOSPITAL_COMMUNITY): Payer: Self-pay

## 2023-11-09 MED ORDER — OXYCODONE HCL 15 MG PO TABS
15.0000 mg | ORAL_TABLET | Freq: Three times a day (TID) | ORAL | 0 refills | Status: DC
  Filled 2023-11-09 – 2023-11-10 (×2): qty 90, 30d supply, fill #0

## 2023-11-10 ENCOUNTER — Other Ambulatory Visit (HOSPITAL_COMMUNITY): Payer: Self-pay

## 2023-12-08 ENCOUNTER — Other Ambulatory Visit (HOSPITAL_COMMUNITY): Payer: Self-pay

## 2023-12-09 ENCOUNTER — Other Ambulatory Visit (HOSPITAL_COMMUNITY): Payer: Self-pay

## 2023-12-09 ENCOUNTER — Other Ambulatory Visit: Payer: Self-pay

## 2023-12-09 MED ORDER — OXYCODONE HCL 15 MG PO TABS
15.0000 mg | ORAL_TABLET | Freq: Three times a day (TID) | ORAL | 0 refills | Status: DC
  Filled 2023-12-09: qty 90, 30d supply, fill #0

## 2024-01-01 NOTE — Progress Notes (Signed)
 Virtual Visit  Subjective: Subjective Patient ID: Minta Fair is a 53 y.o. female.  HPI  Patient states cannot take pills, medication must be liquid.  Nose Complaint Duration of current symptoms:  2 days Associated symptoms: nasal congestion, cough, fatigue, headache, postnasal drip, runny nose, sinus pain, sore throat and sweats   Associated symptoms: no myalgias, no chills, no diarrhea, no fever, no nausea, no swollen glands and no vomiting   Treatments tried:  Over-the-counter medication Response to treatment:  No change Special considerations:  None apply  Review of Systems  Constitutional:  Positive for diaphoresis and fatigue. Negative for chills and fever.  HENT:  Positive for congestion, postnasal drip, rhinorrhea, sinus pressure, sinus pain and sore throat. Negative for ear pain.   Respiratory:  Positive for cough.   Gastrointestinal:  Negative for diarrhea, nausea and vomiting.  Musculoskeletal:  Negative for myalgias.  Neurological:  Positive for headaches.   Social History   Tobacco Use  Smoking Status Never  . Passive exposure: Never  Smokeless Tobacco Never   History reviewed. No pertinent past medical history. No past surgical history on file. No family history on file.  Objective: Objective Physical Exam HENT:     Head: Normocephalic and atraumatic.     Right Ear: Hearing normal. No tenderness.     Left Ear: Hearing normal. No tenderness.     Nose: Congestion and rhinorrhea present. Rhinorrhea is purulent.     Right Sinus: Maxillary sinus tenderness and frontal sinus tenderness present.     Left Sinus: Maxillary sinus tenderness and frontal sinus tenderness present.  Eyes:     General: Lids are normal.  Musculoskeletal:     Cervical back: Normal range of motion.  Lymphadenopathy:     Cervical: No cervical adenopathy.     Right cervical: No superficial cervical adenopathy.    Left cervical: No superficial cervical adenopathy.  Neurological:      Mental Status: She is alert.  Psychiatric:        Behavior: Behavior is cooperative.       Assessment/Plan: Assessment HPI provided by Self  Based on today's visit:history and physical exam only, as no relevant testing deemed necessary patient's visit diagnosis is/includes  1. Acute non-recurrent pansinusitis    Patient does not have a history of chronic conditions.   Plan Treatment plan includes:  Orders Placed: No orders of the defined types were placed in this encounter.  Medications ordered this visit   Signed Prescriptions Disp Refills  . amoxicillin-clavulanate (AUGMENTIN) 600-42.9 mg/5 mL suspension 100 mL 0    Sig: Take 5 mL (600 mg total) by mouth 2 (two) times a day for 10 days    Current medication list and any new medications prescribed or recommended today were reviewed with the patient and specific instructions were provided Yes  Provider Recommendations    Patient appeared stable throughout visit and showed no signs of distress on video. Instructed patient to follow up with UC or ER, depending on severity of symptoms, for a in person evaluation in 72 hours if no improvement or sooner if symptoms worsen. Red flags and plan of care reviewed and discussed with patient. Verbalized understanding and voiced no questions or concerns.    [facility] virtual visit completed within [facility] guidelines. Provider directed patient of what to do during physical exam to assist in the assessment of the patient. Physical exam was limited due to video visit.     If you have any questions or concerns about  your visit, please contact our virtual care customer support center at [phone]   I have verified the patient's location and am licensed to practice in that state.  Audio and video technology were used to conduct this virtual visit. Patient (or parent/guardian as applicable) consented to virtual care. If patient is a minor, parent/guardian and patient are both  present for the duration of the visit.     Follow up care instructions were provided and reviewed?with the  Patient. All questions were answered. Patient verbalized understanding of plan of care today.                                               I am having Anastasya Mally start on amoxicillin-clavulanate.  I have verified the patient's location, and I am licensed to practice in that state. Audio and video technology were used to conduct this virtual visit. Patient (or parent/guardian as applicable) consented to virtual care.  Patient is: not a minor  Rx left electronically sent to pharmacy at: 4:59 PM

## 2024-01-09 ENCOUNTER — Other Ambulatory Visit (HOSPITAL_COMMUNITY): Payer: Self-pay

## 2024-01-11 ENCOUNTER — Other Ambulatory Visit (HOSPITAL_COMMUNITY): Payer: Self-pay

## 2024-01-11 MED ORDER — OXYCODONE HCL 15 MG PO TABS
15.0000 mg | ORAL_TABLET | Freq: Three times a day (TID) | ORAL | 0 refills | Status: DC
  Filled 2024-01-11: qty 55, 19d supply, fill #0
  Filled 2024-01-11: qty 35, 11d supply, fill #0

## 2024-02-01 ENCOUNTER — Encounter (HOSPITAL_COMMUNITY): Payer: Self-pay

## 2024-02-01 ENCOUNTER — Ambulatory Visit (HOSPITAL_COMMUNITY)
Admission: EM | Admit: 2024-02-01 | Discharge: 2024-02-01 | Disposition: A | Discharge status: Home / Self Care | Payer: MEDICAID | Attending: Emergency Medicine | Admitting: Emergency Medicine

## 2024-02-01 DIAGNOSIS — Z4803 Encounter for change or removal of drains: Secondary | ICD-10-CM | POA: Diagnosis not present

## 2024-02-01 NOTE — ED Triage Notes (Signed)
 Patient had Lipo-suction and tummy tuck on June 21 in [place]. She would like us  to removed her drain.

## 2024-02-01 NOTE — ED Notes (Signed)
Suture removed by the provider

## 2024-02-01 NOTE — ED Provider Notes (Signed)
 [facility]    CSN: [id number] Arrival date & time: 02/01/24  1017      History   Chief Complaint No chief complaint on file.   HPI Angela Cisneros is a 53 y.o. female.   Patient presents to clinic requesting JP drain removal after lipo suction and tummy tuck procedure on 6/24 in [place], [place] . Drain in RLQ area. Has not had drainage from the site. JP drain has scant drainage, has not had to empty this since her flight home.   The history is provided by the patient and medical records.    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   History of hypertension 04/25/2017   History of diabetes mellitus 04/25/2017   History of gluten intolerance 04/25/2017   Lump in throat 04/11/2017   Thyroid  disease 10/20/2016   ANA positive 10/18/2016   Essential hypertension 10/18/2016   Type 2 diabetes mellitus (HCC) 10/18/2016   Other fatigue 10/18/2016   Hair thinning 10/18/2016   Rectal bleeding 07/21/2016   Chronic diarrhea 07/21/2016   Diarrhea 07/14/2016   Bloating 07/14/2016   Myalgia 06/17/2016   Primary osteoarthritis of both knees 06/17/2016   S/P panniculectomy 11/18/2014   Abdominal pain 01/08/2009   NAUSEA WITH VOMITING 01/08/2009   TROCHANTERIC BURSITIS, RIGHT 12/12/2008   ALLERGIC REACTION 10/11/2008    Past Surgical History:  Procedure Laterality Date   CHOLECYSTECTOMY     COLONOSCOPY     KNEE SURGERY     LIPOSUCTION N/A 11/18/2014   Procedure: LIPOSUCTION;  Surgeon: Elna Pick, MD;  Location: [facility] OR;  Service: Plastics;  Laterality: N/A;   PANNICULECTOMY  11/18/2014   with repair / diastasis recti   PANNICULECTOMY N/A 11/18/2014   Procedure:  PANNICULECTOMY WITH REPAIR OF DIASTASIS RECTI ;  Surgeon: Elna Pick, MD;  Location: [facility] OR;  Service: Plastics;  Laterality: N/A;   POLYPECTOMY     THYROID  SURGERY     TUBAL LIGATION     VAGINAL HYSTERECTOMY      OB History     Gravida  6   Para  6   Term  5   Preterm  1   AB      Living  6      SAB      IAB      Ectopic      Multiple      Live Births               Home Medications    Prior to Admission medications   Medication Sig Start Date End Date Taking? Authorizing Provider  atorvastatin (LIPITOR) 20 MG tablet Take 20 mg by mouth daily. 01/02/22   [provider]  cholestyramine  (QUESTRAN ) 4 g packet Take 1 packet (4 g total) by mouth 2 (two) times daily. 02/17/17   Zehr, Jessica D, PA-C  diclofenac  sodium (VOLTAREN ) 1 % GEL Apply 4 g topically 4 (four) times daily. 10/21/16   Dolphus Reiter, MD  HUMALOG MIX 75/25 KWIKPEN (75-25) 100 UNIT/ML Kwikpen INJECT 30 UNITS SQ BID 12/13/18   [provider]  hyoscyamine  (LEVSIN  SL) 0.125 MG SL tablet Place 1 tablet (0.125 mg  total) under the tongue every 6 (six) hours as needed. 01/13/22   Zehr, Jessica D, PA-C  metoCLOPramide  (REGLAN ) 10 MG tablet Take 1 tablet (10 mg total) by mouth every 8 (eight) hours as needed for nausea. 01/13/22   Zehr, Jessica D, PA-C  naloxone [facility]) nasal spray 4 mg/0.1 mL SMARTSIG:Both Nares Patient not taking: Reported on 02/16/2022 01/02/22   [provider]  olmesartan-hydrochlorothiazide (BENICAR HCT) 40-12.5 MG tablet Take 1 tablet by mouth daily. 09/15/17   [provider]  oxyCODONE  (ROXICODONE ) 15 MG immediate release tablet Take 1 tablet (15 mg total) by mouth 3 (three) times daily. 01/11/24   Ilah Crigler, MD  oxyCODONE -acetaminophen  (PERCOCET) 10-325 MG tablet Take 1 tablet by mouth 3 (three) times daily. 01/07/22   [provider]  TRULICITY 0.75 MG/0.5ML SOPN INJECT 0.75 MG INTO THE SKIN Q 7 DAYS FOR DIABETES 07/30/17   [provider]  valACYclovir (VALTREX) 500 MG tablet Take 500 mg by mouth as needed.     [provider]  Vitamin D , Ergocalciferol , (DRISDOL ) 1.25 MG (50000 UT) CAPS capsule Take 1 capsule (50,000 Units total) by mouth every 7 (seven) days. 02/01/19   Dolphus Reiter, MD  loperamide (IMODIUM A-D) 2 MG tablet 2 mg. take one tab after each loose bowel movement   07/14/11  [provider]    Family History Family History  Problem Relation Age of Onset   Colon polyps Mother    Hypertension Mother    Diabetes Mother    Dementia Mother    Hypertension Brother    Hypertension Brother    Asthma Daughter    Eczema Daughter    Diabetes Daughter    Asthma Son    Eczema Son    Colon cancer Cousin        mat cousins father   Hypertension Other    Anesthesia problems Neg Hx    Hypotension Neg Hx    Malignant hyperthermia Neg Hx    Pseudochol deficiency Neg Hx    Angioedema Neg Hx    Atopy Neg Hx    Immunodeficiency Neg Hx    Urticaria Neg Hx    Esophageal cancer Neg Hx    Pancreatic cancer Neg Hx    Stomach cancer Neg Hx    Heart disease Neg Hx     Social History Social History   Tobacco Use   Smoking status: Never   Smokeless tobacco: Never  Vaping Use   Vaping status: Never Used  Substance Use Topics   Alcohol use: Yes    Comment: Occasionally   Drug use: No     Allergies   Canagliflozin, Corn starch, Empagliflozin, Gluten meal, and Metformin    Review of Systems Review of Systems  Per HPI  Physical Exam Triage Vital Signs ED Triage Vitals  Encounter Vitals Group     BP 02/01/24 1103 117/78     Girls Systolic BP Percentile --      Girls Diastolic BP Percentile --      Boys Systolic BP Percentile --      Boys Diastolic BP Percentile --      Pulse Rate 02/01/24 1103 (!) 110     Resp 02/01/24 1103 (!) 96     Temp 02/01/24 1103 98.5 F (36.9 C)     Temp Source 02/01/24 1103 Oral     SpO2 02/01/24 1103 96 %     Weight --      Height --  Head Circumference --      Peak Flow --      Pain Score 02/01/24 1107 0     Pain Loc --      Pain Education --      Exclude from Growth Chart --    No data found.  Updated Vital Signs BP 117/78 (BP Location: Left Arm)   Pulse (!) 110   Temp 98.5 F (36.9 C) (Oral)   Resp 18   SpO2 96%   Visual Acuity Right Eye Distance:   Left Eye Distance:   Bilateral Distance:    Right Eye Near:   Left Eye Near:    Bilateral Near:     Physical Exam Vitals and nursing note reviewed.  Constitutional:      Appearance: Normal appearance.  HENT:     Head: Normocephalic and atraumatic.     Right Ear: External ear normal.     Left Ear: External ear normal.     Nose: Nose normal.     Mouth/Throat:     Mouth: Mucous membranes are moist.  Eyes:     Conjunctiva/sclera: Conjunctivae normal.  Cardiovascular:     Rate and Rhythm: Normal rate.  Pulmonary:     Effort: Pulmonary effort is normal. No respiratory distress.  Abdominal:   Skin:    General: Skin is warm and dry.  Neurological:     General: No focal deficit present.     Mental Status: She is alert.  Psychiatric:        Mood and Affect: Mood normal.        Behavior: Behavior is cooperative.      UC Treatments / Results  Labs (all labs ordered are listed, but only abnormal results are displayed) Labs Reviewed - No data to display  EKG   Radiology No results found.  Procedures Procedures (including critical care time)  Medications Ordered in UC Medications - No data to display  Initial Impression / Assessment and Plan / UC Course  I have reviewed the triage vital signs and the nursing notes.  Pertinent labs & imaging results that were available during my care of the patient were reviewed by me and considered in my medical decision making (see chart for details).  Vitals and triage reviewed, patient is hemodynamically stable.  JP drain out of right lower quadrant secured with sutures.  Sutures were removed and  attempted JP drain removal, however, met with resistance and patient felt a tugging in the left side of her abdomen. Consulted Dr. Rolinda and Darice Mclean, PA-C who removed the anchor suture to her left lower abdomen and was able to remove drain without issues.   Proper wound care encouraged.  Plan of care, follow-up care return precautions given, no questions at this time.    Final Clinical Impressions(s) / UC Diagnoses   Final diagnoses:  Encounter for change or removal of drains     Discharge Instructions      We removed your JP drain today. Continue wound care. The sutures under your breast should be dissolvable, if questions please follow-up with your surgeon. Please keep the site clean and dry, you can clean with warm water  and hibicleans twice daily.   Follow-up with plastics as needed.      ED Prescriptions   None    PDMP not reviewed this encounter.   Angela Cisneros  N, FNP 02/01/24 [phone]

## 2024-02-01 NOTE — Discharge Instructions (Addendum)
 We removed your JP drain today. Continue wound care. The sutures under your breast should be dissolvable, if questions please follow-up with your surgeon. Please keep the site clean and dry, you can clean with warm water  and hibicleans twice daily.   Follow-up with plastics as needed.

## 2024-02-04 ENCOUNTER — Emergency Department (HOSPITAL_COMMUNITY)
Admission: EM | Admit: 2024-02-04 | Discharge: 2024-02-04 | Disposition: A | Discharge status: Home / Self Care | Payer: MEDICAID | Source: Intra-hospital | Attending: Emergency Medicine | Admitting: Emergency Medicine

## 2024-02-04 ENCOUNTER — Emergency Department (HOSPITAL_COMMUNITY): Payer: MEDICAID

## 2024-02-04 ENCOUNTER — Encounter (HOSPITAL_COMMUNITY): Payer: Self-pay | Admitting: Emergency Medicine

## 2024-02-04 DIAGNOSIS — R739 Hyperglycemia, unspecified: Secondary | ICD-10-CM

## 2024-02-04 DIAGNOSIS — L03311 Cellulitis of abdominal wall: Secondary | ICD-10-CM | POA: Diagnosis not present

## 2024-02-04 DIAGNOSIS — K5900 Constipation, unspecified: Secondary | ICD-10-CM | POA: Diagnosis not present

## 2024-02-04 DIAGNOSIS — D72829 Elevated white blood cell count, unspecified: Secondary | ICD-10-CM | POA: Insufficient documentation

## 2024-02-04 DIAGNOSIS — D75839 Thrombocytosis, unspecified: Secondary | ICD-10-CM | POA: Insufficient documentation

## 2024-02-04 DIAGNOSIS — R1084 Generalized abdominal pain: Secondary | ICD-10-CM | POA: Diagnosis present

## 2024-02-04 DIAGNOSIS — D649 Anemia, unspecified: Secondary | ICD-10-CM | POA: Insufficient documentation

## 2024-02-04 DIAGNOSIS — E1165 Type 2 diabetes mellitus with hyperglycemia: Secondary | ICD-10-CM | POA: Diagnosis not present

## 2024-02-04 LAB — URINALYSIS, ROUTINE W REFLEX MICROSCOPIC
Bacteria, UA: NONE SEEN
Bilirubin Urine: NEGATIVE
Glucose, UA: >=500 mg/dL — AB
Hgb urine dipstick: NEGATIVE
Ketones, ur: NEGATIVE mg/dL
Nitrite: NEGATIVE
Protein, ur: NEGATIVE mg/dL
Specific Gravity, Urine: 1.025 (ref 1.005–1.030)
pH: 7 (ref 5.0–8.0)

## 2024-02-04 LAB — CBG MONITORING, ED: Glucose-Capillary: 319 mg/dL — ABNORMAL HIGH (ref 70–99)

## 2024-02-04 LAB — COMPREHENSIVE METABOLIC PANEL WITH GFR
ALT: 22 U/L (ref 0–44)
AST: 20 U/L (ref 15–41)
Albumin: 2.6 g/dL — ABNORMAL LOW (ref 3.5–5.0)
Alkaline Phosphatase: 67 U/L (ref 38–126)
Anion gap: 9 (ref 5–15)
BUN: 13 mg/dL (ref 6–20)
CO2: 24 mmol/L (ref 22–32)
Calcium: 8.4 mg/dL — ABNORMAL LOW (ref 8.9–10.3)
Chloride: 102 mmol/L (ref 98–111)
Creatinine, Ser: 0.74 mg/dL (ref 0.44–1.00)
GFR, Estimated: >60 mL/min (ref >60)
Glucose, Bld: 275 mg/dL — ABNORMAL HIGH (ref 70–99)
Potassium: 4.8 mmol/L (ref 3.5–5.1)
Sodium: 135 mmol/L (ref 135–145)
Total Bilirubin: 0.8 mg/dL (ref 0.0–1.2)
Total Protein: 6.1 g/dL — ABNORMAL LOW (ref 6.5–8.1)

## 2024-02-04 LAB — CBC WITH DIFFERENTIAL/PLATELET
Abs Immature Granulocytes: 0.1 K/uL — ABNORMAL HIGH (ref 0.00–0.07)
Basophils Absolute: 0 K/uL (ref 0.0–0.1)
Basophils Relative: 0 %
Eosinophils Absolute: 0 K/uL (ref 0.0–0.5)
Eosinophils Relative: 0 %
HCT: 28.1 % — ABNORMAL LOW (ref 36.0–46.0)
Hemoglobin: 9.6 g/dL — ABNORMAL LOW (ref 12.0–15.0)
Immature Granulocytes: 1 %
Lymphocytes Relative: 15 %
Lymphs Abs: 2.1 K/uL (ref 0.7–4.0)
MCH: 31.2 pg (ref 26.0–34.0)
MCHC: 34.2 g/dL (ref 30.0–36.0)
MCV: 91.2 fL (ref 80.0–100.0)
Monocytes Absolute: 0.4 K/uL (ref 0.1–1.0)
Monocytes Relative: 3 %
Neutro Abs: 11.3 K/uL — ABNORMAL HIGH (ref 1.7–7.7)
Neutrophils Relative %: 81 %
Platelets: 527 K/uL — ABNORMAL HIGH (ref 150–400)
RBC: 3.08 MIL/uL — ABNORMAL LOW (ref 3.87–5.11)
RDW: 14.6 % (ref 11.5–15.5)
WBC: 14 K/uL — ABNORMAL HIGH (ref 4.0–10.5)
nRBC: 0 % (ref 0.0–0.2)

## 2024-02-04 LAB — LIPASE, BLOOD: Lipase: 36 U/L (ref 11–51)

## 2024-02-04 MED ORDER — CEPHALEXIN 500 MG PO CAPS: 500.0000 mg | ORAL_CAPSULE | Freq: Four times a day (QID) | ORAL | 0 refills | Status: DC

## 2024-02-04 MED ORDER — IOHEXOL 350 MG/ML SOLN
75.0000 mL | Freq: Once | INTRAVENOUS | Status: AC | PRN
Start: 2024-02-04 — End: 2024-02-04
  Administered 2024-02-04: 75 mL via INTRAVENOUS

## 2024-02-04 MED ORDER — ONDANSETRON HCL 4 MG/2ML IJ SOLN
4.0000 mg | Freq: Once | INTRAMUSCULAR | Status: AC
  Administered 2024-02-04: 4 mg via INTRAVENOUS
  Filled 2024-02-04: qty 2

## 2024-02-04 MED ORDER — ONDANSETRON 4 MG PO TBDP: 4.0000 mg | ORAL_TABLET | Freq: Three times a day (TID) | ORAL | 0 refills | Status: DC | PRN

## 2024-02-04 NOTE — ED Notes (Signed)
 Pt ambulatory to bathroom at this time.

## 2024-02-04 NOTE — ED Notes (Signed)
 Dc instructions and scripts reviewed with pt no questions or concerns at this time. Will follow up with PCP Monday. PT taken to car in wheelchair.

## 2024-02-04 NOTE — Discharge Instructions (Addendum)
 Start MiraLAX, 17 g daily as needed for constipation.  Start Keflex , take 1 tablet by mouth 4 times daily for suspected skin infection of your abdomen.  Continue with Flagyl  as previously directed.  Start Zofran , take every 8 hours as needed for nausea.  Keep your scheduled appointment for follow-up with your primary care provider on Monday.  Return to the emergency department if your symptoms worsen.

## 2024-02-04 NOTE — ED Notes (Signed)
 EDP at bedside

## 2024-02-04 NOTE — ED Provider Notes (Signed)
 [place] EMERGENCY DEPARTMENT AT [facility] Provider Note   CSN: [id number] Arrival date & time: 02/04/24  8386     Patient presents with: Constipation and Abdominal Pain   Angela Cisneros is a 53 y.o. female.   53 year old female presenting with constipation.  Patient had liposuction and BBL surgery 6/24 in [place], patient last had a bowel movement a week ago today and none since, she has attempted to have a bowel movement today but is unable to pass any stool, she tried 2 enemas at home today with no success.  She reports generalized abdominal pain, no vomiting but reports she has been taking Zofran  regularly so unsure if she has been nauseous at any point.  Patient has follow-up scheduled with her primary care provider on Monday.  She has been on Flagyl  since her surgery at the end of June.  Denies dysuria/hematuria, fever.  Surgical drain was recently removed at urgent care.   Constipation Associated symptoms: abdominal pain   Abdominal Pain Associated symptoms: constipation        Prior to Admission medications   Medication Sig Start Date End Date Taking? Authorizing Provider  atorvastatin (LIPITOR) 20 MG tablet Take 20 mg by mouth daily. 01/02/22   [provider]  cholestyramine  (QUESTRAN ) 4 g packet Take 1 packet (4 g total) by mouth 2 (two) times daily. 02/17/17   Zehr, Jessica D, PA-C  diclofenac  sodium (VOLTAREN ) 1 % GEL Apply 4 g topically 4 (four) times daily. 10/21/16   Dolphus Reiter, MD  HUMALOG MIX 75/25 KWIKPEN (75-25) 100 UNIT/ML Kwikpen INJECT 30 UNITS SQ BID 12/13/18   [provider]  hyoscyamine  (LEVSIN  SL) 0.125 MG SL tablet Place 1 tablet (0.125 mg total) under the tongue every 6 (six) hours as needed. 01/13/22   Zehr, Jessica D, PA-C  metoCLOPramide  (REGLAN ) 10 MG tablet Take 1 tablet (10 mg total) by mouth every 8 (eight) hours as needed for nausea. 01/13/22   Zehr, Jessica D, PA-C  naloxone [facility]) nasal spray 4 mg/0.1 mL  SMARTSIG:Both Nares Patient not taking: Reported on 02/16/2022 01/02/22   [provider]  olmesartan-hydrochlorothiazide (BENICAR HCT) 40-12.5 MG tablet Take 1 tablet by mouth daily. 09/15/17   [provider]  oxyCODONE  (ROXICODONE ) 15 MG immediate release tablet Take 1 tablet (15 mg total) by mouth 3 (three) times daily. 01/11/24   Ilah Crigler, MD  oxyCODONE -acetaminophen  (PERCOCET) 10-325 MG tablet Take 1 tablet by mouth 3 (three) times daily. 01/07/22   [provider]  TRULICITY 0.75 MG/0.5ML SOPN INJECT 0.75 MG INTO THE SKIN Q 7 DAYS FOR DIABETES 07/30/17   [provider]  valACYclovir (VALTREX) 500 MG tablet Take 500 mg by mouth as needed.     [provider]  Vitamin D , Ergocalciferol , (DRISDOL ) 1.25 MG (50000 UT) CAPS capsule Take 1 capsule (50,000 Units total) by mouth every 7 (seven) days. 02/01/19   Dolphus Reiter, MD  loperamide (IMODIUM A-D) 2 MG tablet 2 mg. take one tab after each loose bowel movement   07/14/11  [provider]    Allergies: Canagliflozin, Corn starch, Empagliflozin, Gluten meal, and Metformin     Review of Systems  Gastrointestinal:  Positive for abdominal pain and constipation.    Updated Vital Signs  Vitals:   02/04/24 2048 02/04/24 2115 02/04/24 2121 02/04/24 2130  BP: 122/73 107/67 107/67 108/63  Pulse:  99 98 100  Resp:   17 18  Temp:    98.6 F (37 C)  TempSrc:  SpO2:  100% 100% 100%       Physical Exam Vitals and nursing note reviewed.  HENT:     Head: Normocephalic.  Eyes:     Extraocular Movements: Extraocular movements intact.  Cardiovascular:     Rate and Rhythm: Normal rate.  Pulmonary:     Effort: Pulmonary effort is normal.  Abdominal:     Palpations: Abdomen is soft.     Comments: Mild generalized TTP Horizontal surgical scar extending across lower abdomen, 2 small circular surgical scars inferior to breasts  Musculoskeletal:     Cervical back: Normal range of motion.      Right lower leg: No edema.     Left lower leg: No edema.     Comments: Moves all extremities spontaneously without difficulty  Skin:    General: Skin is warm and dry.     Comments: Visible induration of the skin of the right abdomen lateral to umbilicus, no erythema/warmth/drainage  Neurological:     Mental Status: She is alert and oriented to person, place, and time.     (all labs ordered are listed, but only abnormal results are displayed) Labs Reviewed  COMPREHENSIVE METABOLIC PANEL WITH GFR - Abnormal; Notable for the following components:      Result Value   Glucose, Bld 275 (*)    Calcium 8.4 (*)    Total Protein 6.1 (*)    Albumin 2.6 (*)    All other components within normal limits  CBC WITH DIFFERENTIAL/PLATELET - Abnormal; Notable for the following components:   WBC 14.0 (*)    RBC 3.08 (*)    Hemoglobin 9.6 (*)    HCT 28.1 (*)    Platelets 527 (*)    Neutro Abs 11.3 (*)    Abs Immature Granulocytes 0.10 (*)    All other components within normal limits  URINALYSIS, ROUTINE W REFLEX MICROSCOPIC - Abnormal; Notable for the following components:   APPearance HAZY (*)    Glucose, UA >=500 (*)    Leukocytes,Ua TRACE (*)    All other components within normal limits  CBG MONITORING, ED - Abnormal; Notable for the following components:   Glucose-Capillary 319 (*)    All other components within normal limits  URINE CULTURE  LIPASE, BLOOD    EKG: None  Radiology: CT ABDOMEN PELVIS W CONTRAST Result Date: 02/04/2024 CLINICAL DATA:  constipation, recent liposuction/BBL, concern for bowel obstruction EXAM: CT ABDOMEN AND PELVIS WITH CONTRAST TECHNIQUE: Multidetector CT imaging of the abdomen and pelvis was performed using the standard protocol following bolus administration of intravenous contrast. RADIATION DOSE REDUCTION: This exam was performed according to the departmental dose-optimization program which includes automated exposure control, adjustment of the mA  and/or kV according to patient size and/or use of iterative reconstruction technique. CONTRAST:  75mL OMNIPAQUE  IOHEXOL  350 MG/ML SOLN COMPARISON:  CT abdomen pelvis 02/01/2013 FINDINGS: Lower chest: No acute abnormality. Hepatobiliary: No focal liver abnormality. Status post cholecystectomy. No biliary dilatation. Pancreas: No focal lesion. Normal pancreatic contour. No surrounding inflammatory changes. No main pancreatic ductal dilatation. Spleen: Normal in size without focal abnormality. Adrenals/Urinary Tract: No adrenal nodule bilaterally. Bilateral kidneys enhance symmetrically. No hydronephrosis. No hydroureter. The urinary bladder is unremarkable. On delayed imaging, there is no urothelial wall thickening and there are no filling defects in the opacified portions of the bilateral collecting systems or ureters. Stomach/Bowel: Stomach is within normal limits. No evidence of bowel wall thickening or dilatation. Small bowel diverticulosis. The appendix is not definitely identified with no  inflammatory changes in the right lower quadrant to suggest acute appendicitis. Vascular/Lymphatic: No abdominal aorta or iliac aneurysm. Mild atherosclerotic plaque of the aorta and its branches. No abdominal, pelvic, or inguinal lymphadenopathy. Reproductive: Status post hysterectomy. 2.9 cm fluid density lesion of the left ovary. No follow-up imaging recommended. Note: This recommendation does not apply to premenarchal patients and to those with increased risk (genetic, family history, elevated tumor markers or other high-risk factors) of ovarian cancer. Reference: JACR 2020 Feb; 17(2):248-254. Otherwise no adnexal masses. Other: No intraperitoneal free fluid. No intraperitoneal free gas. No organized fluid collection. Musculoskeletal: Lower anterior abdominal, pelvic, inguinal subcutaneus soft tissue edema and emphysema. Mid back subcutaneus soft tissue edema and emphysema. Upper abdominal on back subcutaneus soft tissue  edema. No organized fluid collection. No suspicious lytic or blastic osseous lesions. No acute displaced fracture. Multilevel degenerative changes of the spine. IMPRESSION: 1. No acute intra-abdominal or intrapelvic abnormality. 2. Lower anterior abdominal, pelvic, inguinal subcutaneus soft tissue edema and emphysema. Mid back subcutaneus soft tissue edema and emphysema. Upper abdominal on back subcutaneus soft tissue edema. No organized fluid collection. Finding likely postsurgical. 3.  Aortic Atherosclerosis (ICD10-I70.0). Electronically Signed   By: Morgane  Naveau M.D.   On: 02/04/2024 20:57     Procedures   Medications Ordered in the ED  ondansetron  (ZOFRAN ) injection 4 mg (4 mg Intravenous Given 02/04/24 2050)  iohexol  (OMNIPAQUE ) 350 MG/ML injection 75 mL (75 mLs Intravenous Contrast Given 02/04/24 2036)                                    Medical Decision Making This patient presents to the ED for concern of constipation, this involves an extensive number of treatment options, and is a complaint that carries with it a high risk of complications and morbidity.  The differential diagnosis includes bowel obstruction, fecal impaction, postoperative ileus, functional constipation, other postoperative complication.   Co morbidities that complicate the patient evaluation  T2DM, recent BBL/liposuction   Additional history obtained:  Additional history obtained from record review External records from outside source obtained and reviewed including recent UC note   Lab Tests:  I Ordered, and personally interpreted labs.  The pertinent results include: CBC notable for leukocytosis with left shift with white blood cell count of 14, anemia with hemoglobin of 9.6, elevation in platelet count with platelets of 527.  Urine notable for glucosuria with trace leukocytes, I suspect this is likely contaminant secondary to presence of squamous epithelial cells but will send for culture as a precaution.   CMP notable for hyperglycemia, decreased calcium/protein/albumin.  Lipase within normal limits.   Imaging Studies ordered:  I ordered imaging studies including CT abdomen/pelvis  I independently visualized and interpreted imaging which showed  1. No acute intra-abdominal or intrapelvic abnormality. 2. Lower anterior abdominal, pelvic, inguinal subcutaneus soft tissue edema and emphysema. Mid back subcutaneus soft tissue edema and emphysema. Upper abdominal on back subcutaneus soft tissue edema. No organized fluid collection. Finding likely postsurgical. 3.  Aortic Atherosclerosis (ICD10-I70.0).  I agree with the radiologist interpretation   Cardiac Monitoring: / EKG:  The patient was maintained on a cardiac monitor.  I personally viewed and interpreted the cardiac monitored which showed an underlying rhythm of: NSR   Problem List / ED Course / Critical interventions / Medication management  I ordered medication including Zofran  for nausea Reevaluation of the patient after these medicines showed that the patient  improved I have reviewed the patients home medicines and have made adjustments as needed   Test / Admission - Considered:  Physical exam notable as above.  Patient did have 1 episode of a little bit of vomiting while in the emergency department this evening, was given Zofran  and nausea resolved.  CT results as above, notable for postsurgical findings but no bowel obstruction or fecal impaction noted on imaging results.  See above for lab interpretations.  I am suspicious that patient may have developing a cellulitis on her right abdomen, drain was removed several days ago and she has notable induration of the skin in this area with leukocytosis of 14, however no erythema/warmth/drainage and no fluid collection noted on imaging, patient is afebrile, no tachycardia, otherwise well-appearing.  I will start her on course of Keflex  for suspected abdominal wall cellulitis. She is  scheduled to follow-up with her primary care provider on Monday, her surgery was performed in [place] and she does not have a scheduled follow-up with this surgeon.  I encouraged the patient to start MiraLAX daily for constipation, increase fluid intake and prioritize diet high in fiber for resolution of constipation.  She voiced understanding and is in agreement with this plan.  Will prescribe Zofran  for PRN nausea.  She is appropriate for discharge at this time, strict return precautions discussed.    Amount and/or Complexity of Data Reviewed Labs: ordered. Radiology: ordered.  Risk Prescription drug management.        Final diagnoses:  Constipation, unspecified constipation type  Cellulitis of abdominal wall  Leukocytosis, unspecified type  Hyperglycemia    ED Discharge Orders          Ordered    cephALEXin  (KEFLEX ) 500 MG capsule  4 times daily        02/04/24 2126    ondansetron  (ZOFRAN -ODT) 4 MG disintegrating tablet  Every 8 hours PRN        02/04/24 2127               Glendia Rocky SAILOR, PA-C 02/04/24 2351    Geraldene Glendia, MD 02/05/24 2224

## 2024-02-04 NOTE — ED Triage Notes (Signed)
 PT did 2 OTC fleets enemas at home today with no success.

## 2024-02-04 NOTE — ED Triage Notes (Addendum)
 PT BIB EMS From home with complains of lower abd pain and no BM in 6 days. PT recently had liposuction and tummy tuck. EMS noted pt was weaker on left side than right when transporting to stretcher. Pt denies vomiting. Has urge to urinate but unable to since this morning. PT states she had lipo in stomach and thighs and moved to buttock. Has been receiving massages to abd, back and thighs and massage therapist told her Thursday that right side of abd was harder than the left.    CBG 470

## 2024-02-06 LAB — URINE CULTURE: Culture: NO GROWTH

## 2024-02-07 ENCOUNTER — Ambulatory Visit: Payer: MEDICAID | Admitting: Neurology

## 2024-02-07 ENCOUNTER — Encounter: Payer: Self-pay | Admitting: Neurology

## 2024-02-07 VITALS — BP 124/85 | HR 58 | Ht 63.0 in | Wt 159.0 lb

## 2024-02-07 DIAGNOSIS — R413 Other amnesia: Secondary | ICD-10-CM

## 2024-02-07 MED ORDER — GABAPENTIN 300 MG PO CAPS: 300.0000 mg | ORAL_CAPSULE | Freq: Two times a day (BID) | ORAL | 0 refills | Status: DC

## 2024-02-07 NOTE — Progress Notes (Signed)
 [facility]  PATIENT: Angela Cisneros DOB: 1970-11-28  REQUESTING CLINICIAN: Ilah Crigler, MD HISTORY FROM: Husband  REASON FOR VISIT: Memory loss/Changes in behavior    HISTORICAL  CHIEF COMPLAINT:  Chief Complaint  Patient presents with   New Patient (Initial Visit)    Rm12, friend present,  referral for Memory loss / Crigler Ilah MD [facility] [phone]: pt very touch sensory oriented touched everything going down the hall. mmse score of 15, also has issues with agitation.     HISTORY OF PRESENT ILLNESS:  This is a 53 year old woman past medical history hypertension, hyperlipidemia, chronic migraines, diabetes mellitus, hypothyroidism who is presenting with her partner with complaint of memory loss and confusion.  Partner has been telling me for the past year and a half, patient has been complaining of memory loss, confusion, getting lost in family of place.  This has been getting worse.  He tells me that patient has some good days and bad days.  They do live with their daughter who is taking care of the patient in the morning, and partner looks after him in the evening.  She has not been driving for the past 6 months.  Partner also tell me lately patient has been touching a lot of stuff, he does not understand why but she is touching things around her. On June 28, patient traveled to [place] to have a tummy tuck and she was doing fine but today during initial visit patient was selectively mute and answering with gesticulations, touching things around the office, the wall, everything.  Once I return to give them the AVS she was responding to me appropriately with complete sentences telling me that her psychiatrist at [facility] referred her here for memory problem. There is also history of early dementia in both parents, mother died in her 79s and father die in her 16s, both of dementia.   TBI:   No past history of TBI Stroke: no past history of  stroke Seizures:   no past history of seizures Sleep: Has sleep apnea but not using  Mood: History of anxiety and depression Family history of Dementia: Mother died from Dementia in her 40s, Father with Dementia in his 87  Functional status: Dependent in some ADLs Patient lives with family. Cooking: sometimes Cleaning: no Shopping: yes Bathing: no issues  Toileting: no issues  Driving: sometimes, been lost while driving, last time she drove 6 months ago  Bills: need some helps  Medications: daughter helps  Ever left the stove on by accident?: no Forget how to use items around the house?: yes  Getting lost going to familiar places?: yes  Forgetting loved ones names?: yes Word finding difficulty? yes Sleep: yes   OTHER MEDICAL CONDITIONS: Diabetes Mellitus, chronic pain, chronic headaches, hypertension, Hypothyroidism    REVIEW OF SYSTEMS: Full 14 system review of systems performed and negative with exception of: As noted in the HPI   ALLERGIES: Allergies  Allergen Reactions   Canagliflozin Other (See Comments)   Corn Starch Hives and Itching    Powder in gloves   Empagliflozin Other (See Comments)   Gluten Meal    Metformin  Other (See Comments)    HOME MEDICATIONS: Outpatient Medications Prior to Visit  Medication Sig Dispense Refill   cholestyramine  (QUESTRAN ) 4 g packet Take 1 packet (4 g total) by mouth 2 (two) times daily. 60 each 1   diclofenac  sodium (VOLTAREN ) 1 % GEL Apply 4 g topically 4 (four) times daily. 5 Tube  1   HUMALOG MIX 75/25 KWIKPEN (75-25) 100 UNIT/ML Kwikpen INJECT 30 UNITS SQ BID     metoCLOPramide  (REGLAN ) 10 MG tablet Take 1 tablet (10 mg total) by mouth every 8 (eight) hours as needed for nausea. 30 tablet 0   naloxone (NARCAN) nasal spray 4 mg/0.1 mL SMARTSIG:Both Nares     olmesartan-hydrochlorothiazide (BENICAR HCT) 40-12.5 MG tablet Take 1 tablet by mouth daily.  0   ondansetron  (ZOFRAN -ODT) 4 MG disintegrating tablet Take 1 tablet (4 mg  total) by mouth every 8 (eight) hours as needed for nausea or vomiting. 20 tablet 0   oxyCODONE  (ROXICODONE ) 15 MG immediate release tablet Take 1 tablet (15 mg total) by mouth 3 (three) times daily. 90 tablet 0   valACYclovir (VALTREX) 500 MG tablet Take 500 mg by mouth as needed.      Vitamin D , Ergocalciferol , (DRISDOL ) 1.25 MG (50000 UT) CAPS capsule Take 1 capsule (50,000 Units total) by mouth every 7 (seven) days. 12 capsule 0   atorvastatin (LIPITOR) 20 MG tablet Take 20 mg by mouth daily.     cephALEXin  (KEFLEX ) 500 MG capsule Take 1 capsule (500 mg total) by mouth 4 (four) times daily. 20 capsule 0   hyoscyamine  (LEVSIN  SL) 0.125 MG SL tablet Place 1 tablet (0.125 mg total) under the tongue every 6 (six) hours as needed. 30 tablet 2   oxyCODONE -acetaminophen  (PERCOCET) 10-325 MG tablet Take 1 tablet by mouth 3 (three) times daily.     TRULICITY 0.75 MG/0.5ML SOPN INJECT 0.75 MG INTO THE SKIN Q 7 DAYS FOR DIABETES  11   No facility-administered medications prior to visit.    PAST MEDICAL HISTORY: Past Medical History:  Diagnosis Date   Diabetes mellitus without complication (HCC)    weight controlled   Fibroid, uterine    Headache(784.0)    Herpes    Hypertension    Hypothyroidism     PAST SURGICAL HISTORY: Past Surgical History:  Procedure Laterality Date   CHOLECYSTECTOMY     COLONOSCOPY     KNEE SURGERY     LIPOSUCTION N/A 11/18/2014   Procedure: LIPOSUCTION;  Surgeon: Elna Pick, MD;  Location: [facility] OR;  Service: Plastics;  Laterality: N/A;   PANNICULECTOMY  11/18/2014   with repair / diastasis recti   PANNICULECTOMY N/A 11/18/2014   Procedure: PANNICULECTOMY WITH REPAIR OF DIASTASIS RECTI ;  Surgeon: Elna Pick, MD;  Location: [facility] OR;  Service: Plastics;  Laterality: N/A;   POLYPECTOMY     THYROID  SURGERY     TUBAL LIGATION     VAGINAL HYSTERECTOMY      FAMILY HISTORY: Family History  Problem Relation Age of Onset   Colon polyps Mother     Hypertension Mother    Diabetes Mother    Dementia Mother    Hypertension Brother    Hypertension Brother    Asthma Daughter    Eczema Daughter    Diabetes Daughter    Asthma Son    Eczema Son    Colon cancer Cousin        mat cousins father   Hypertension Other    Anesthesia problems Neg Hx    Hypotension Neg Hx    Malignant hyperthermia Neg Hx    Pseudochol deficiency Neg Hx    Angioedema Neg Hx    Atopy Neg Hx    Immunodeficiency Neg Hx    Urticaria Neg Hx    Esophageal cancer Neg Hx    Pancreatic cancer Neg Hx  Stomach cancer Neg Hx    Heart disease Neg Hx     SOCIAL HISTORY: Social History   Socioeconomic History   Marital status: Widowed    Spouse name: Not on file   Number of children: 6   Years of education: 12   Highest education level: Not on file  Occupational History   Occupation: Midwife  Tobacco Use   Smoking status: Never   Smokeless tobacco: Never  Vaping Use   Vaping status: Never Used  Substance and Sexual Activity   Alcohol use: Yes    Comment: Occasionally   Drug use: No   Sexual activity: Yes    Partners: Male    Birth control/protection: Surgical  Other Topics Concern   Not on file  Social History Narrative   Fun/Hobby: No hobbies   Social Drivers of Corporate investment banker Strain: Not on file  Food Insecurity: Not on file  Transportation Needs: Not on file  Physical Activity: Not on file  Stress: Not on file  Social Connections: Unknown (12/13/2021)   [facility]   Social Network    Social Network: Not on file  Intimate Partner Violence: Unknown (11/04/2021)   Received from [facility]   HITS    Physically Hurt: Not on file    Insult or Talk Down To: Not on file    Threaten Physical Harm: Not on file    Scream or Curse: Not on file    PHYSICAL EXAM  GENERAL EXAM/CONSTITUTIONAL: Vitals:  Vitals:   02/07/24 1052  BP: 124/85  Pulse: (!) 58  Weight: 159 lb (72.1 kg)  Height: 5' 3 (1.6 m)    Body mass index is 28.17 kg/m. Wt Readings from Last 3 Encounters:  02/07/24 159 lb (72.1 kg)  02/16/22 180 lb (81.6 kg)  01/13/22 180 lb 2 oz (81.7 kg)   Patient is in no distress; well developed, nourished and groomed; neck is supple  MUSCULOSKELETAL: Gait, strength, tone, movements noted in Neurologic exam below  NEUROLOGIC: MENTAL STATUS:     02/07/2024   10:43 AM  MMSE - Mini Mental State Exam  Orientation to time 1  Orientation to Place 5  Registration 2  Attention/ Calculation 1  Recall 0  Language- name 2 objects 2  Language- repeat 0  Language- follow 3 step command 2  Language- read & follow direction 1  Write a sentence 1  Copy design 0  Total score 15   awake, initially she was selectively mute, answering only with gesticulations.  She was able to tell me the month and date of birth but that the year  CRANIAL NERVE:  2nd, 3rd, 4th, 6th- visual fields full to confrontation, extraocular muscles intact, no nystagmus 5th - facial sensation symmetric 7th - facial strength symmetric 8th - hearing intact 9th - palate elevates symmetrically, uvula midline 11th - shoulder shrug symmetric 12th - tongue protrusion midline  MOTOR:  normal bulk and tone, full strength in the BUE, BLE  SENSORY:  normal and symmetric to light touch  COORDINATION:  finger-nose-finger, fine finger movements normal  GAIT/STATION:  normal   DIAGNOSTIC DATA (LABS, IMAGING, TESTING) - I reviewed patient records, labs, notes, testing and imaging myself where available.  Lab Results  Component Value Date   WBC 14.0 (H) 02/04/2024   HGB 9.6 (L) 02/04/2024   HCT 28.1 (L) 02/04/2024   MCV 91.2 02/04/2024   PLT 527 (H) 02/04/2024      Component Value Date/Time  NA 135 02/04/2024 1733   K 4.8 02/04/2024 1733   CL 102 02/04/2024 1733   CO2 24 02/04/2024 1733   GLUCOSE 275 (H) 02/04/2024 1733   BUN 13 02/04/2024 1733   CREATININE 0.74 02/04/2024 1733   CREATININE 0.65  01/30/2019 1402   CALCIUM 8.4 (L) 02/04/2024 1733   PROT 6.1 (L) 02/04/2024 1733   ALBUMIN 2.6 (L) 02/04/2024 1733   AST 20 02/04/2024 1733   ALT 22 02/04/2024 1733   ALKPHOS 67 02/04/2024 1733   BILITOT 0.8 02/04/2024 1733   GFRNONAA >60 02/04/2024 1733   GFRNONAA 107 01/30/2019 1402   GFRAA 124 01/30/2019 1402   No results found for: CHOL, HDL, LDLCALC, LDLDIRECT, TRIG, CHOLHDL Lab Results  Component Value Date   HGBA1C 8.2 (H) 10/20/2016   No results found for: VITAMINB12 Lab Results  Component Value Date   TSH 0.79 01/13/2022     ASSESSMENT AND PLAN  53 y.o. year old female with history of hypertension, hyperlipidemia, hypothyroidism, diabetes mellitus, neuropathy who is presenting with complaint of memory loss for the past year and a half.  Memory loss described as forgetfulness, confusion, getting loss in familiar places.  Initially, on exam she scored a 15 out of 30 on MMSE.  At some point during the examination she was selectively mute but later reports she was able to answer appropriately.  Uncertain if this is purely behavioral or if there is some component of memory loss but I will do my extensive reversible cause of dementia workup.  Will also obtain MRI and EEG.  I will contact the patient to go over the results, if workup unrevealing, patient will need close follow-up with psychiatry.  This was discussed with patient and partner.  Continue follow-up PCP return if worse.   1. Memory loss      There are no Patient Instructions on file for this visit.  Orders Placed This Encounter  Procedures   MR BRAIN WO CONTRAST   Dementia Panel   ATN PROFILE   APOE Alzheimer's Risk   Autoimmune Profile   TSH   Vitamin B12   RPR   EEG adult    Meds ordered this encounter  Medications   gabapentin  (NEURONTIN ) 300 MG capsule    Sig: Take 1 capsule (300 mg total) by mouth 2 (two) times daily.    Dispense:  60 capsule    Refill:  0    Return if symptoms  worsen or fail to improve.  I personally spent a total of 65 minutes in the care of the patient today including preparing to see the patient, getting/reviewing separately obtained history, performing a medically appropriate exam/evaluation, counseling and educating, placing orders, and documenting clinical information in the EHR.   Pastor Falling, MD 02/07/2024, 9:09 PM  [facility] [address], [address] [place], [place] [address] [phone]

## 2024-02-09 ENCOUNTER — Other Ambulatory Visit (HOSPITAL_COMMUNITY): Payer: Self-pay

## 2024-02-09 MED ORDER — OXYCODONE HCL 15 MG PO TABS
15.0000 mg | ORAL_TABLET | Freq: Three times a day (TID) | ORAL | 0 refills | Status: DC
  Filled 2024-02-09: qty 90, 30d supply, fill #0

## 2024-02-13 ENCOUNTER — Ambulatory Visit (INDEPENDENT_AMBULATORY_CARE_PROVIDER_SITE_OTHER): Payer: MEDICAID | Admitting: Neurology

## 2024-02-13 DIAGNOSIS — R413 Other amnesia: Secondary | ICD-10-CM

## 2024-02-13 DIAGNOSIS — R4182 Altered mental status, unspecified: Secondary | ICD-10-CM | POA: Diagnosis not present

## 2024-02-13 NOTE — Procedures (Signed)
    History:  53 year old woman with memory loss   EEG classification: Awake and drowsy  Duration: 30 minutes   Technical aspects: This EEG study was done with scalp electrodes positioned according to the 10-20 International system of electrode placement. Electrical activity was reviewed with band pass filter of 1-70Hz , sensitivity of 7 uV/mm, display speed of 66mm/sec with a 60Hz  notched filter applied as appropriate. EEG data were recorded continuously and digitally stored.   Description of the recording: The background rhythms of this recording consists of a fairly well modulated medium amplitude alpha rhythm of 12 Hz that is reactive to eye opening and closure. Present in the anterior head region is a 15-20 Hz beta activity. Photic stimulation was performed, did not show any abnormalities. Hyperventilation was also performed, did not show any abnormalities. Drowsiness was manifested by background fragmentation. No abnormal epileptiform discharges seen during this recording. There was no focal slowing. There were no electrographic seizure identified.   Abnormality: None   Impression: This is a normal awake and drowsy EEG. No evidence of interictal epileptiform discharges. Normal EEGs, however, do not rule out epilepsy.    Angela Freeburg, MD [facility]

## 2024-02-14 ENCOUNTER — Telehealth: Payer: Self-pay | Admitting: Neurology

## 2024-02-14 NOTE — Telephone Encounter (Signed)
 trillium shara: [id number] exp. 02/09/24-04/09/24 sent to GI [phone]

## 2024-02-15 ENCOUNTER — Ambulatory Visit: Payer: Self-pay | Admitting: Neurology

## 2024-02-15 ENCOUNTER — Telehealth: Payer: Self-pay | Admitting: Neurology

## 2024-02-15 LAB — ATN PROFILE
A -- Beta-amyloid 42/40 Ratio: 0.112 (ref >0.102)
Beta-amyloid 40: 185.65 pg/mL
Beta-amyloid 42: 20.77 pg/mL
N -- NfL, Plasma: 22.8 pg/mL — ABNORMAL HIGH (ref 0.00–2.99)
T -- p-tau181: 0.75 pg/mL (ref 0.00–0.95)

## 2024-02-15 LAB — AUTOIMMUNE PROFILE
Anti Nuclear Antibody (ANA): POSITIVE — AB
Complement C3, Serum: 227 mg/dL — ABNORMAL HIGH (ref 82–167)
dsDNA Ab: <1 [IU]/mL (ref 0–9)

## 2024-02-15 LAB — DEMENTIA PANEL
Homocysteine: 10.9 umol/L (ref 0.0–14.5)
RPR Ser Ql: NONREACTIVE
TSH: 0.872 u[IU]/mL (ref 0.450–4.500)
Vitamin B-12: 842 pg/mL (ref 232–1245)

## 2024-02-15 LAB — APOE ALZHEIMER'S RISK

## 2024-02-15 LAB — RPR

## 2024-02-15 NOTE — Telephone Encounter (Signed)
 Pt called stating that she received a message that there are results in her mychart and she would like to know if it is for her labs or EEG. Please advise.

## 2024-02-17 ENCOUNTER — Encounter: Payer: Self-pay | Admitting: Neurology

## 2024-02-28 ENCOUNTER — Other Ambulatory Visit: Payer: MEDICAID

## 2024-03-07 ENCOUNTER — Other Ambulatory Visit (HOSPITAL_COMMUNITY): Payer: Self-pay

## 2024-03-07 MED ORDER — ALPRAZOLAM 0.5 MG PO TABS
0.5000 mg | ORAL_TABLET | Freq: Three times a day (TID) | ORAL | 1 refills | Status: AC
  Filled 2024-03-08: qty 90, 30d supply, fill #0

## 2024-03-07 MED ORDER — OXYCODONE HCL 15 MG PO TABS
15.0000 mg | ORAL_TABLET | Freq: Three times a day (TID) | ORAL | 0 refills | Status: DC
  Filled 2024-03-08: qty 90, 30d supply, fill #0

## 2024-03-07 MED ORDER — OXYCODONE HCL 15 MG PO TABS
15.0000 mg | ORAL_TABLET | Freq: Three times a day (TID) | ORAL | 0 refills | Status: DC
  Filled 2024-08-07: qty 90, 30d supply, fill #0

## 2024-03-07 MED ORDER — DEXCOM G6 TRANSMITTER MISC: 2 refills | Status: DC

## 2024-03-07 MED ORDER — GABAPENTIN 600 MG PO TABS
600.0000 mg | ORAL_TABLET | Freq: Three times a day (TID) | ORAL | 3 refills | Status: DC
  Filled 2024-03-07: qty 90, 30d supply, fill #0

## 2024-03-08 ENCOUNTER — Other Ambulatory Visit (HOSPITAL_COMMUNITY): Payer: Self-pay

## 2024-04-09 ENCOUNTER — Other Ambulatory Visit (HOSPITAL_COMMUNITY): Payer: Self-pay

## 2024-04-09 MED ORDER — OXYCODONE HCL 15 MG PO TABS
15.0000 mg | ORAL_TABLET | Freq: Three times a day (TID) | ORAL | 0 refills | Status: DC
  Filled 2024-04-09: qty 90, 30d supply, fill #0

## 2024-04-09 MED ORDER — GABAPENTIN 600 MG PO TABS
600.0000 mg | ORAL_TABLET | Freq: Three times a day (TID) | ORAL | 2 refills | Status: AC
  Filled 2024-04-09: qty 90, 30d supply, fill #0

## 2024-04-10 ENCOUNTER — Other Ambulatory Visit (HOSPITAL_COMMUNITY): Payer: Self-pay

## 2024-04-30 ENCOUNTER — Other Ambulatory Visit: Payer: Self-pay

## 2024-04-30 ENCOUNTER — Emergency Department (HOSPITAL_COMMUNITY): Payer: MEDICAID

## 2024-04-30 ENCOUNTER — Emergency Department (HOSPITAL_COMMUNITY)
Admission: EM | Admit: 2024-04-30 | Discharge: 2024-04-30 | Disposition: A | Discharge status: Home / Self Care | Payer: MEDICAID | Attending: Emergency Medicine | Admitting: Emergency Medicine

## 2024-04-30 ENCOUNTER — Encounter (HOSPITAL_COMMUNITY): Payer: Self-pay

## 2024-04-30 DIAGNOSIS — E119 Type 2 diabetes mellitus without complications: Secondary | ICD-10-CM | POA: Diagnosis not present

## 2024-04-30 DIAGNOSIS — K5900 Constipation, unspecified: Secondary | ICD-10-CM | POA: Insufficient documentation

## 2024-04-30 LAB — CBC WITH DIFFERENTIAL/PLATELET
Abs Immature Granulocytes: 0.01 K/uL (ref 0.00–0.07)
Basophils Absolute: 0 K/uL (ref 0.0–0.1)
Basophils Relative: 0 %
Eosinophils Absolute: 0 K/uL (ref 0.0–0.5)
Eosinophils Relative: 0 %
HCT: 45.3 % (ref 36.0–46.0)
Hemoglobin: 15.3 g/dL — ABNORMAL HIGH (ref 12.0–15.0)
Immature Granulocytes: 0 %
Lymphocytes Relative: 26 %
Lymphs Abs: 1.6 K/uL (ref 0.7–4.0)
MCH: 30.2 pg (ref 26.0–34.0)
MCHC: 33.8 g/dL (ref 30.0–36.0)
MCV: 89.3 fL (ref 80.0–100.0)
Monocytes Absolute: 0.2 K/uL (ref 0.1–1.0)
Monocytes Relative: 4 %
Neutro Abs: 4.2 K/uL (ref 1.7–7.7)
Neutrophils Relative %: 70 %
Platelets: 271 K/uL (ref 150–400)
RBC: 5.07 MIL/uL (ref 3.87–5.11)
RDW: 12.1 % (ref 11.5–15.5)
WBC: 6.1 K/uL (ref 4.0–10.5)
nRBC: 0 % (ref 0.0–0.2)

## 2024-04-30 LAB — COMPREHENSIVE METABOLIC PANEL WITH GFR
ALT: 15 U/L (ref 0–44)
AST: 18 U/L (ref 15–41)
Albumin: 3.9 g/dL (ref 3.5–5.0)
Alkaline Phosphatase: 61 U/L (ref 38–126)
Anion gap: 12 (ref 5–15)
BUN: 13 mg/dL (ref 6–20)
CO2: 23 mmol/L (ref 22–32)
Calcium: 8.8 mg/dL — ABNORMAL LOW (ref 8.9–10.3)
Chloride: 100 mmol/L (ref 98–111)
Creatinine, Ser: 0.75 mg/dL (ref 0.44–1.00)
GFR, Estimated: >60 mL/min (ref >60)
Glucose, Bld: 263 mg/dL — ABNORMAL HIGH (ref 70–99)
Potassium: 4.1 mmol/L (ref 3.5–5.1)
Sodium: 135 mmol/L (ref 135–145)
Total Bilirubin: 0.6 mg/dL (ref 0.0–1.2)
Total Protein: 7.1 g/dL (ref 6.5–8.1)

## 2024-04-30 LAB — LIPASE, BLOOD: Lipase: 61 U/L — ABNORMAL HIGH (ref 11–51)

## 2024-04-30 MED ORDER — LIDOCAINE HCL URETHRAL/MUCOSAL 2 % EX GEL
1.0000 | Freq: Once | CUTANEOUS | Status: AC
  Administered 2024-04-30: 1 via TOPICAL
  Filled 2024-04-30: qty 11

## 2024-04-30 MED ORDER — ONDANSETRON 4 MG PO TBDP
4.0000 mg | ORAL_TABLET | Freq: Once | ORAL | Status: AC
  Administered 2024-04-30: 4 mg via ORAL
  Filled 2024-04-30: qty 1

## 2024-04-30 MED ORDER — FLEET ENEMA RE ENEM
1.0000 | ENEMA | Freq: Once | RECTAL | Status: AC
  Administered 2024-04-30: 1 via RECTAL
  Filled 2024-04-30: qty 1

## 2024-04-30 MED ORDER — ACETAMINOPHEN 500 MG PO TABS
1000.0000 mg | ORAL_TABLET | Freq: Once | ORAL | Status: AC
  Administered 2024-04-30: 1000 mg via ORAL
  Filled 2024-04-30: qty 2

## 2024-04-30 NOTE — ED Provider Notes (Signed)
 [place] EMERGENCY DEPARTMENT AT [facility] Provider Note   CSN: [id number] Arrival date & time: 04/30/24  1556     Patient presents with: Abdominal Pain and Constipation   Angela Cisneros is a 53 y.o. female hx of diabetes, constipation here presenting with stool impaction.  Patient states that she has no bowel movement for about a week or so.  This is a recurrent issue and she has a history of fecal impaction requiring disimpaction previously.  Patient states that she is taking MiraLAX with no relief.  Patient had some vomiting but able to keep down fluids today.   The history is provided by the patient.       Prior to Admission medications   Medication Sig Start Date End Date Taking? Authorizing Provider  ALPRAZolam  (XANAX ) 0.5 MG tablet Take 1 tablet (0.5 mg total) by mouth 3 (three) times daily. 03/07/24     cholestyramine  (QUESTRAN ) 4 g packet Take 1 packet (4 g total) by mouth 2 (two) times daily. 02/17/17   Zehr, Jessica D, PA-C  Continuous Glucose Transmitter (DEXCOM G6 TRANSMITTER) MISC Replace transmitter every 3 months 03/07/24     diclofenac  sodium (VOLTAREN ) 1 % GEL Apply 4 g topically 4 (four) times daily. 10/21/16   Dolphus Reiter, MD  gabapentin  (NEURONTIN ) 300 MG capsule Take 1 capsule (300 mg total) by mouth 2 (two) times daily. 02/07/24   Camara, Amadou, MD  gabapentin  (NEURONTIN ) 600 MG tablet Take 1 tablet (600 mg total) by mouth 3 (three) times daily. 03/07/24     gabapentin  (NEURONTIN ) 600 MG tablet Take 1 tablet (600 mg total) by mouth 3 (three) times daily. 04/09/24     HUMALOG MIX 75/25 KWIKPEN (75-25) 100 UNIT/ML Kwikpen INJECT 30 UNITS SQ BID 12/13/18   [provider]  metoCLOPramide  (REGLAN ) 10 MG tablet Take 1 tablet (10 mg total) by mouth every 8 (eight) hours as needed for nausea. 01/13/22   Zehr, Jessica D, PA-C  naloxone [facility]) nasal spray 4 mg/0.1 mL SMARTSIG:Both Nares 01/02/22   [provider]   olmesartan-hydrochlorothiazide (BENICAR HCT) 40-12.5 MG tablet Take 1 tablet by mouth daily. 09/15/17   [provider]  ondansetron  (ZOFRAN -ODT) 4 MG disintegrating tablet Take 1 tablet (4 mg total) by mouth every 8 (eight) hours as needed for nausea or vomiting. 02/04/24   Scott, Rocky SAILOR, PA-C  oxyCODONE  (ROXICODONE ) 15 MG immediate release tablet Take 1 tablet (15 mg total) by mouth 3 (three) times daily. 02/09/24   Ilah Crigler, MD  oxyCODONE  (ROXICODONE ) 15 MG immediate release tablet Take 1 tablet (15 mg total) by mouth 3 (three) times daily. 03/08/24   Ilah Crigler, MD  oxyCODONE  (ROXICODONE ) 15 MG immediate release tablet Take 1 tablet (15 mg total) by mouth 3 (three) times daily. 04/05/24   Ilah Crigler, MD  oxyCODONE  (ROXICODONE ) 15 MG immediate release tablet Take 1 tablet (15 mg total) by mouth 3 (three) times daily. 04/09/24   Ilah Crigler, MD  valACYclovir (VALTREX) 500 MG tablet Take 500 mg by mouth as needed.     [provider]  Vitamin D , Ergocalciferol , (DRISDOL ) 1.25 MG (50000 UT) CAPS capsule Take 1 capsule (50,000 Units total) by mouth every 7 (seven) days. 02/01/19   Dolphus Reiter, MD  loperamide (IMODIUM A-D) 2 MG tablet 2 mg. take one tab after each loose bowel movement   07/14/11  [provider]    Allergies: Canagliflozin, Corn starch, Empagliflozin, Gluten meal, and Metformin     Review of  Systems  Gastrointestinal:  Positive for abdominal pain and constipation.  All other systems reviewed and are negative.   Updated Vital Signs BP (!) 166/100   Pulse 95   Temp 98.3 F (36.8 C)   Resp 16   SpO2 100%   Physical Exam Vitals and nursing note reviewed.  Constitutional:      Comments: Uncomfortable  HENT:     Head: Normocephalic.     Mouth/Throat:     Mouth: Mucous membranes are moist.  Eyes:     Extraocular Movements: Extraocular movements intact.     Pupils: Pupils are equal, round, and reactive to light.  Cardiovascular:     Rate and  Rhythm: Normal rate and regular rhythm.     Heart sounds: Normal heart sounds.  Pulmonary:     Effort: Pulmonary effort is normal.     Breath sounds: Normal breath sounds.  Abdominal:     General: Abdomen is flat.     Comments: Mildly distended but nontender  Genitourinary:    Comments: Rectal- stool impaction  Neurological:     Mental Status: She is alert and oriented to person, place, and time.  Psychiatric:        Mood and Affect: Mood normal.        Behavior: Behavior normal.     (all labs ordered are listed, but only abnormal results are displayed) Labs Reviewed  CBC WITH DIFFERENTIAL/PLATELET - Abnormal; Notable for the following components:      Result Value   Hemoglobin 15.3 (*)    All other components within normal limits  COMPREHENSIVE METABOLIC PANEL WITH GFR - Abnormal; Notable for the following components:   Glucose, Bld 263 (*)    Calcium 8.8 (*)    All other components within normal limits  URINALYSIS, ROUTINE W REFLEX MICROSCOPIC  LIPASE, BLOOD    EKG: None  Radiology: DG Abdomen 1 View Result Date: 04/30/2024 CLINICAL DATA:  severe constipation EXAM: ABDOMEN - 1 VIEW COMPARISON:  November 27, 2014, February 04, 2024 FINDINGS: Nonobstructive bowel gas pattern.Moderate to large volume fecal loading throughout the colon, most pronounced within the ascending colon.No pneumoperitoneum. No organomegaly or radiopaque calculi. Cholecystectomy clips. No acute fracture or destructive lesion. Degenerative changes of the pubic symphysis. The lung bases are clear. IMPRESSION: Nonobstructive bowel gas pattern. Moderate to large volume fecal loading throughout the colon, most pronounced within the ascending colon, consistent with patient's history of constipation. Electronically Signed   By: Rogelia Myers M.D.   On: 04/30/2024 17:38     Procedures   Medications Ordered in the ED  acetaminophen  (TYLENOL ) tablet 1,000 mg (1,000 mg Oral Given 04/30/24 1622)  ondansetron   (ZOFRAN -ODT) disintegrating tablet 4 mg (4 mg Oral Given 04/30/24 1849)  sodium phosphate (FLEET) enema 1 enema (1 enema Rectal Given 04/30/24 2001)  lidocaine  (XYLOCAINE ) 2 % jelly 1 Application (1 Application Topical Given by Other 04/30/24 2001)                                    Medical Decision Making Angela Cisneros is a 53 y.o. female here presenting with constipation and abdominal pain.  Concern for possible stool impaction.  Plan to get KUB and labs.  I perform rectal exam patient has stool impaction.  Will try enema and reassess  9:04 PM Patient was given enema and had a large bowel movement.  Patient is feeling much better now.  Recommend continuing MiraLAX twice daily.   Problems Addressed: Constipation, unspecified constipation type: acute illness or injury  Risk OTC drugs.     Final diagnoses:  None    ED Discharge Orders     None          Patt Alm Macho, MD 04/30/24 2105

## 2024-04-30 NOTE — Discharge Instructions (Signed)
 As we discussed, you have stool impaction.  Please take MiraLAX twice daily.  Please stay hydrated  See your doctor for follow-up  Return to ER if you have severe abdominal pain or no bowel movement for a week or vomiting

## 2024-04-30 NOTE — ED Provider Triage Note (Signed)
 Emergency Medicine Provider Triage Evaluation Note  Angela Cisneros , a 53 y.o. female  was evaluated in triage.  Pt complains of constipation, worse over the past 2 days.  Patient with nausea and vomiting that occurs especially after trying to strain.  She feels pressure in her rectal area.  She has tried an enema without improvement.  Reports history of liposuction.  Transported by EMS today.  Review of Systems  Positive: Constipation Negative: Fever  Physical Exam  BP (!) 142/83   Pulse 77   Temp 98.1 F (36.7 C)   Resp 16   SpO2 100%  Gen:   Awake, appears uncomfortable, having difficulty sitting upright Resp:  Normal effort  MSK:   Moves extremities without difficulty  Other:    Medical Decision Making  Medically screening exam initiated at 4:16 PM.  Appropriate orders placed.  Angela Cisneros was informed that the remainder of the evaluation will be completed by another provider, this initial triage assessment does not replace that evaluation, and the importance of remaining in the ED until their evaluation is complete.  Labs, abdominal x-ray   Desiderio Chew, PA-C 04/30/24 1616

## 2024-04-30 NOTE — ED Triage Notes (Addendum)
 Pt bib ems from home; constipation x 3 days; tried to have BM today , passed bright pink blood in stool; pt also tremulous and nauseated ; cbg 160 initially, bag d10 given PTA, cbg 270 after; 20 ga lac; vss; tremors and nausea have ceased; pt c/o rectal pain

## 2024-05-02 ENCOUNTER — Other Ambulatory Visit: Payer: Self-pay

## 2024-05-02 ENCOUNTER — Other Ambulatory Visit (HOSPITAL_COMMUNITY): Payer: Self-pay

## 2024-05-02 MED ORDER — LINZESS 72 MCG PO CAPS
72.0000 ug | ORAL_CAPSULE | Freq: Every day | ORAL | 1 refills | Status: DC
  Filled 2024-05-02: qty 30, 30d supply, fill #0

## 2024-05-02 MED ORDER — ONDANSETRON 4 MG PO TBDP
4.0000 mg | ORAL_TABLET | Freq: Four times a day (QID) | ORAL | 0 refills | Status: AC
  Filled 2024-05-02: qty 120, 30d supply, fill #0

## 2024-05-02 MED ORDER — LACTULOSE 10 GM/15ML PO SOLN
20.0000 g | Freq: Three times a day (TID) | ORAL | 2 refills | Status: DC
  Filled 2024-05-02: qty 473, 5d supply, fill #0

## 2024-05-02 MED ORDER — OXYCODONE HCL 15 MG PO TABS
15.0000 mg | ORAL_TABLET | Freq: Three times a day (TID) | ORAL | 0 refills | Status: DC
  Filled 2024-05-08 (×2): qty 90, 30d supply, fill #0

## 2024-05-07 ENCOUNTER — Other Ambulatory Visit: Payer: Self-pay

## 2024-05-08 ENCOUNTER — Other Ambulatory Visit: Payer: Self-pay

## 2024-05-08 ENCOUNTER — Other Ambulatory Visit (HOSPITAL_COMMUNITY): Payer: Self-pay

## 2024-06-08 ENCOUNTER — Other Ambulatory Visit (HOSPITAL_COMMUNITY): Payer: Self-pay

## 2024-06-08 MED ORDER — OXYCODONE HCL 15 MG PO TABS
15.0000 mg | ORAL_TABLET | Freq: Three times a day (TID) | ORAL | 0 refills | Status: DC
  Filled 2024-06-08: qty 90, 30d supply, fill #0

## 2024-06-19 ENCOUNTER — Other Ambulatory Visit: Payer: Self-pay

## 2024-07-06 ENCOUNTER — Other Ambulatory Visit (HOSPITAL_COMMUNITY): Payer: Self-pay

## 2024-07-06 MED ORDER — OXYCODONE HCL 15 MG PO TABS
15.0000 mg | ORAL_TABLET | Freq: Three times a day (TID) | ORAL | 0 refills | Status: DC
  Filled 2024-07-06: qty 90, 30d supply, fill #0

## 2024-07-17 ENCOUNTER — Other Ambulatory Visit: Payer: MEDICAID

## 2024-07-17 ENCOUNTER — Encounter: Payer: Self-pay | Admitting: Gastroenterology

## 2024-07-17 ENCOUNTER — Ambulatory Visit (INDEPENDENT_AMBULATORY_CARE_PROVIDER_SITE_OTHER): Payer: MEDICAID | Admitting: Gastroenterology

## 2024-07-17 ENCOUNTER — Ambulatory Visit (INDEPENDENT_AMBULATORY_CARE_PROVIDER_SITE_OTHER)
Admission: RE | Admit: 2024-07-17 | Discharge: 2024-07-17 | Disposition: A | Discharge status: Home / Self Care | Payer: MEDICAID | Source: Ambulatory Visit | Attending: Gastroenterology | Admitting: Gastroenterology

## 2024-07-17 VITALS — BP 110/76 | HR 89 | Ht 63.0 in | Wt 163.0 lb

## 2024-07-17 DIAGNOSIS — Z860100 Personal history of colon polyps, unspecified: Secondary | ICD-10-CM | POA: Diagnosis not present

## 2024-07-17 DIAGNOSIS — K625 Hemorrhage of anus and rectum: Secondary | ICD-10-CM

## 2024-07-17 DIAGNOSIS — K5900 Constipation, unspecified: Secondary | ICD-10-CM

## 2024-07-17 DIAGNOSIS — Z9889 Other specified postprocedural states: Secondary | ICD-10-CM | POA: Diagnosis not present

## 2024-07-17 DIAGNOSIS — Z8719 Personal history of other diseases of the digestive system: Secondary | ICD-10-CM

## 2024-07-17 DIAGNOSIS — R194 Change in bowel habit: Secondary | ICD-10-CM | POA: Diagnosis not present

## 2024-07-17 DIAGNOSIS — K529 Noninfective gastroenteritis and colitis, unspecified: Secondary | ICD-10-CM

## 2024-07-17 DIAGNOSIS — K5909 Other constipation: Secondary | ICD-10-CM | POA: Diagnosis not present

## 2024-07-17 DIAGNOSIS — Z860102 Personal history of hyperplastic colon polyps: Secondary | ICD-10-CM

## 2024-07-17 DIAGNOSIS — R197 Diarrhea, unspecified: Secondary | ICD-10-CM | POA: Diagnosis not present

## 2024-07-17 LAB — CBC WITH DIFFERENTIAL/PLATELET
Basophils Absolute: 0 K/uL (ref 0.0–0.1)
Basophils Relative: 0.8 % (ref 0.0–3.0)
Eosinophils Absolute: 0.1 K/uL (ref 0.0–0.7)
Eosinophils Relative: 1.2 % (ref 0.0–5.0)
HCT: 40.7 % (ref 36.0–46.0)
Hemoglobin: 13.8 g/dL (ref 12.0–15.0)
Lymphocytes Relative: 49.1 % — ABNORMAL HIGH (ref 12.0–46.0)
Lymphs Abs: 2.4 K/uL (ref 0.7–4.0)
MCHC: 33.9 g/dL (ref 30.0–36.0)
MCV: 89.8 fl (ref 78.0–100.0)
Monocytes Absolute: 0.3 K/uL (ref 0.1–1.0)
Monocytes Relative: 6.3 % (ref 3.0–12.0)
Neutro Abs: 2.1 K/uL (ref 1.4–7.7)
Neutrophils Relative %: 42.6 % — ABNORMAL LOW (ref 43.0–77.0)
Platelets: 238 K/uL (ref 150.0–400.0)
RBC: 4.53 Mil/uL (ref 3.87–5.11)
RDW: 14.2 % (ref 11.5–15.5)
WBC: 4.8 K/uL (ref 4.0–10.5)

## 2024-07-17 LAB — COMPREHENSIVE METABOLIC PANEL WITH GFR
ALT: 14 U/L (ref 3–35)
AST: 13 U/L (ref 5–37)
Albumin: 4.2 g/dL (ref 3.5–5.2)
Alkaline Phosphatase: 78 U/L (ref 39–117)
BUN: 11 mg/dL (ref 6–23)
CO2: 24 meq/L (ref 19–32)
Calcium: 8.6 mg/dL (ref 8.4–10.5)
Chloride: 103 meq/L (ref 96–112)
Creatinine, Ser: 0.69 mg/dL (ref 0.40–1.20)
GFR: 100.58 mL/min (ref >60.00)
Glucose, Bld: 191 mg/dL — ABNORMAL HIGH (ref 70–99)
Potassium: 4.2 meq/L (ref 3.5–5.1)
Sodium: 136 meq/L (ref 135–145)
Total Bilirubin: 0.6 mg/dL (ref 0.2–1.2)
Total Protein: 6.7 g/dL (ref 6.0–8.3)

## 2024-07-17 LAB — TSH: TSH: 0.46 u[IU]/mL (ref 0.35–5.50)

## 2024-07-17 MED ORDER — LINACLOTIDE 145 MCG PO CAPS: 145.0000 ug | ORAL_CAPSULE | Freq: Every day | ORAL | 0 refills | Status: AC

## 2024-07-17 MED ORDER — NA SULFATE-K SULFATE-MG SULF 17.5-3.13-1.6 GM/177ML PO SOLN: 1.0000 | ORAL | 0 refills | Status: DC

## 2024-07-17 NOTE — Progress Notes (Signed)
 Angela Cisneros [id number] 15-May-1971   Chief Complaint: Diarrhea and constipation  Referring Provider: Ilah Crigler, MD Primary GI MD: Dr. Shila  HPI: Angela Cisneros is a 53 y.o. female with past medical history of diabetes, HTN, hypothyroidism, cholecystectomy, hysterectomy, liposuction, panniculectomy who presents today for a complaint of abdominal pain .    Patient last seen in office 01/13/2022 by Harlene Mail, PA-C.  Has had GI symptoms dating back to 2014 (following cholecystectomy).  Has had negative labs for celiac disease.  Colonoscopy in 2010 with removal of multiple polyps.  Repeat colonoscopy 2015 with diverticula in the entire examined colon.  Had increased intraepithelial lymphocytes on biopsy, tried prednisone  but that did not help and also caused blood sugar elevation.  Had normal fecal pancreatic elastase in 2018.  Has tried Xifaxan and probiotics in the past.  Has tried Imodium and Lomotil  in the past.  Repeat colonoscopy in 2018 with 1 hyperplastic polyp found.  Has tried Bentyl  in the past but that made diarrhea worse.  At last visit in 2023 presented for evaluation of the same symptoms.  Experienced almost daily episodes of abdominal cramping, straining with bowel movements but producing explosive diarrhea. Was on Questran  twice a day at that time.  She was advised to try sublingual hyoscyamine  as needed.  Had never had an EGD and this was scheduled to evaluate for any type of enteropathy.  CT A/P also ordered as she had not had one in several years.  Unable to use Viberzi as she does not have a gallbladder.  EGD done 02/16/2022 with a large amount of food in the stomach, successfully removed.  Patient advised to follow a gastroparesis, low-fat diet and gastric emptying study was ordered.  Does not appear she ever at this done.  Seen in the ED 04/30/2024 for constipation and stool impaction.  Had been a week or more since she had a bowel movement.  Noted to be a  recurrent issue with history of fecal impaction requiring disimpaction previously.  Reported she was taking MiraLAX with no relief.  Had some vomiting but able to keep fluids down.  She was given an enema and had a large bowel movement.  Follows with neurology for memory loss. Family history of early dementia.   Discussed the use of AI scribe software for clinical note transcription with the patient, who gave verbal consent to proceed.  History of Present Illness Angela Cisneros is a 53 year old female with chronic diarrhea and constipation who presents with alternating episodes of diarrhea and constipation.  Altered bowel habits - Chronic alternating episodes of diarrhea and constipation over the past two years. - Constipation severe enough to cause stool impactions, requiring emergency room visits for enemas (most recently in September, previously in June). - Bowel movements occur approximately every six days, with stools either formed or diarrheal. - Diarrhea described as 'running' and uncontrollable, often occurring in public places. - Typically goes 6 days without a bowel movement, followed by urgent diarrhea - Occasional blood in stool, attributed to violent bowel movements. - History of hemorrhoids.  Medication effects and management - Ozempic used for approximately three years; reduction in frequency to every other week did not improve bowel habits. - Linzess  taken at a low dose daily, but infrequent bowel movements persist. - Occasional use of smooth move tea to aid bowel movements; sometimes ineffective, leading to hospital visits. - Nausea occurs with Ozempic injections and during severe constipation.  Gastrointestinal procedures and findings -  Past colonoscopy revealed polyps, which were removed; subsequent colonoscopies have not shown polyps. - Upper endoscopy demonstrated delayed gastric emptying.  Surgical history and impact - History of cholecystectomy, after which  increased urgency of bowel movements was noted. - Liposuction surgery earlier this year with no change in bowel habits postoperatively.  Additional gastrointestinal and systemic considerations - No thyroid  replacement therapy despite previous suggestions. - Negative celiac disease testing.  Lifestyle and exposures - Boyfriend smokes marijuana, she denies smoking personally   Previous GI Procedures/Imaging   CT A/P 02/04/2024 IMPRESSION: 1. No acute intra-abdominal or intrapelvic abnormality. 2. Lower anterior abdominal, pelvic, inguinal subcutaneus soft tissue edema and emphysema. Mid back subcutaneus soft tissue edema and emphysema. Upper abdominal on back subcutaneus soft tissue edema. No organized fluid collection. Finding likely postsurgical. 3.  Aortic Atherosclerosis (ICD10-I70.0).  EGD 02/16/2022 - Z- line regular, 38 cm from the incisors.  - No gross lesions in esophagus. - Small hiatal hernia.  - A large amount of food ( residue) in the stomach. Removal was successful.  - Normal examined duodenum.  Colonoscopy 08/2016: - One 3 mm polyp in the sigmoid colon, removed with a cold snare. Resected and retrieved. - Diverticulosis in the left colon. - Normal mucosa in the entire examined colon. Biopsied. - Non-bleeding internal hemorrhoids. - Recall 10 years Path: 1. Surgical [P], random sites - BENIGN COLONIC MUCOSA. - NO SIGNIFICANT INFLAMMATION OR OTHER ABNORMALITIES IDENTIFIED. 2. Surgical [P], sigmoid, polyp - HYPERPLASTIC POLYP(S). - THERE IS NO EVIDENCE OF MALIGNANCY.   Past Medical History:  Diagnosis Date   Diabetes mellitus without complication (HCC)    weight controlled   Fibroid, uterine    Headache(784.0)    Herpes    Hypertension    Hypothyroidism     Past Surgical History:  Procedure Laterality Date   CHOLECYSTECTOMY     COLONOSCOPY     KNEE SURGERY     LIPOSUCTION N/A 11/18/2014   Procedure: LIPOSUCTION;  Surgeon: Elna Pick, MD;   Location: [facility] OR;  Service: Plastics;  Laterality: N/A;   PANNICULECTOMY  11/18/2014   with repair / diastasis recti   PANNICULECTOMY N/A 11/18/2014   Procedure: PANNICULECTOMY WITH REPAIR OF DIASTASIS RECTI ;  Surgeon: Elna Pick, MD;  Location: [facility] OR;  Service: Plastics;  Laterality: N/A;   POLYPECTOMY     THYROID  SURGERY     TUBAL LIGATION     VAGINAL HYSTERECTOMY      Current Outpatient Medications  Medication Sig Dispense Refill   ALPRAZolam  (XANAX ) 0.5 MG tablet Take 1 tablet (0.5 mg total) by mouth 3 (three) times daily. 90 tablet 1   cholestyramine  (QUESTRAN ) 4 g packet Take 1 packet (4 g total) by mouth 2 (two) times daily. 60 each 1   Continuous Glucose Transmitter (DEXCOM G6 TRANSMITTER) MISC Replace transmitter every 3 months 1 each 2   diclofenac  sodium (VOLTAREN ) 1 % GEL Apply 4 g topically 4 (four) times daily. 5 Tube 1   gabapentin  (NEURONTIN ) 300 MG capsule Take 1 capsule (300 mg total) by mouth 2 (two) times daily. 60 capsule 0   gabapentin  (NEURONTIN ) 600 MG tablet Take 1 tablet (600 mg total) by mouth 3 (three) times daily. 90 tablet 3   gabapentin  (NEURONTIN ) 600 MG tablet Take 1 tablet (600 mg total) by mouth 3 (three) times daily. 90 tablet 2   HUMALOG MIX 75/25 KWIKPEN (75-25) 100 UNIT/ML Kwikpen INJECT 30 UNITS SQ BID     lactulose  (CHRONULAC ) 10 GM/15ML solution Take 30  mLs (20 g total) by mouth 3 (three) times daily. 473 mL 2   linaclotide  (LINZESS ) 72 MCG capsule Take 1 capsule (72 mcg total) by mouth daily at least 30 minutes before first meal. 30 capsule 1   metoCLOPramide  (REGLAN ) 10 MG tablet Take 1 tablet (10 mg total) by mouth every 8 (eight) hours as needed for nausea. 30 tablet 0   naloxone (NARCAN) nasal spray 4 mg/0.1 mL SMARTSIG:Both Nares     olmesartan-hydrochlorothiazide (BENICAR HCT) 40-12.5 MG tablet Take 1 tablet by mouth daily.  0   ondansetron  (ZOFRAN -ODT) 4 MG disintegrating tablet Take 1 tablet (4 mg total) by mouth every 8 (eight) hours  as needed for nausea or vomiting. 20 tablet 0   ondansetron  (ZOFRAN -ODT) 4 MG disintegrating tablet Take 1 tablet (4 mg total) by mouth 4 (four) times daily. 120 tablet 0   oxyCODONE  (ROXICODONE ) 15 MG immediate release tablet Take 1 tablet (15 mg total) by mouth 3 (three) times daily. 90 tablet 0   oxyCODONE  (ROXICODONE ) 15 MG immediate release tablet Take 1 tablet (15 mg total) by mouth 3 (three) times daily. 90 tablet 0   oxyCODONE  (ROXICODONE ) 15 MG immediate release tablet Take 1 tablet (15 mg total) by mouth 3 (three) times daily. 90 tablet 0   oxyCODONE  (ROXICODONE ) 15 MG immediate release tablet Take 1 tablet (15 mg total) by mouth 3 (three) times daily. 90 tablet 0   oxyCODONE  (ROXICODONE ) 15 MG immediate release tablet Take 1 tablet (15 mg total) by mouth 3 (three) times daily. 90 tablet 0   oxyCODONE  (ROXICODONE ) 15 MG immediate release tablet Take 1 tablet (15 mg total) by mouth 3 (three) times daily. 90 tablet 0   oxyCODONE  (ROXICODONE ) 15 MG immediate release tablet Take 1 tablet (15 mg total) by mouth 3 (three) times daily. 90 tablet 0   OZEMPIC, 0.25 OR 0.5 MG/DOSE, 2 MG/3ML SOPN Inject 0.5 mg into the skin once a week.     valACYclovir (VALTREX) 500 MG tablet Take 500 mg by mouth as needed.      Vitamin D , Ergocalciferol , (DRISDOL ) 1.25 MG (50000 UT) CAPS capsule Take 1 capsule (50,000 Units total) by mouth every 7 (seven) days. 12 capsule 0   No current facility-administered medications for this visit.    Allergies as of 07/17/2024 - Review Complete 07/17/2024  Allergen Reaction Noted   Canagliflozin Other (See Comments) 05/03/2017   Corn starch Hives and Itching 11/12/2014   Empagliflozin Other (See Comments) 05/03/2017   Gluten meal  04/26/2017   Metformin  Other (See Comments) 05/03/2017    Family History  Problem Relation Age of Onset   Colon polyps Mother    Hypertension Mother    Diabetes Mother    Dementia Mother    Hypertension Brother    Hypertension Brother     Asthma Daughter    Eczema Daughter    Diabetes Daughter    Asthma Son    Eczema Son    Colon cancer Cousin        mat cousins father   Hypertension Other    Anesthesia problems Neg Hx    Hypotension Neg Hx    Malignant hyperthermia Neg Hx    Pseudochol deficiency Neg Hx    Angioedema Neg Hx    Atopy Neg Hx    Immunodeficiency Neg Hx    Urticaria Neg Hx    Esophageal cancer Neg Hx    Pancreatic cancer Neg Hx    Stomach cancer Neg Hx  Heart disease Neg Hx     Social History[1]   Review of Systems:    Constitutional: No unintentional weight loss, fever, chills Cardiovascular: No chest pain Respiratory: No SOB  Gastrointestinal: See HPI and otherwise negative   Physical Exam:  Vital signs: BP 110/76 (BP Location: Left Arm, Patient Position: Sitting, Cuff Size: Normal)   Pulse 89   Ht 5' 3 (1.6 m)   Wt 163 lb (73.9 kg)   BMI 28.87 kg/m   Constitutional: Pleasant, well-appearing female in NAD, alert and cooperative Head:  Normocephalic and atraumatic.  Respiratory: Respirations even and unlabored. Lungs clear to auscultation bilaterally.  No wheezes, crackles, or rhonchi.  Cardiovascular:  Regular rate and rhythm. No murmurs. No peripheral edema. Gastrointestinal:  Soft, nondistended, nontender. No rebound or guarding. Normal bowel sounds. No appreciable masses or hepatomegaly. Rectal:  Not performed.  Neurologic:  Alert and oriented x4;  grossly normal neurologically.  Skin:   Dry and intact without significant lesions or rashes. Psychiatric: Oriented to person, place and time. Demonstrates good judgement and reason without abnormal affect or behaviors.   RELEVANT LABS AND IMAGING: CBC    Component Value Date/Time   WBC 6.1 04/30/2024 1617   RBC 5.07 04/30/2024 1617   HGB 15.3 (H) 04/30/2024 1617   HCT 45.3 04/30/2024 1617   PLT 271 04/30/2024 1617   MCV 89.3 04/30/2024 1617   MCH 30.2 04/30/2024 1617   MCHC 33.8 04/30/2024 1617   RDW 12.1 04/30/2024  1617   LYMPHSABS 1.6 04/30/2024 1617   MONOABS 0.2 04/30/2024 1617   EOSABS 0.0 04/30/2024 1617   BASOSABS 0.0 04/30/2024 1617    CMP     Component Value Date/Time   NA 135 04/30/2024 1617   K 4.1 04/30/2024 1617   CL 100 04/30/2024 1617   CO2 23 04/30/2024 1617   GLUCOSE 263 (H) 04/30/2024 1617   BUN 13 04/30/2024 1617   CREATININE 0.75 04/30/2024 1617   CREATININE 0.65 01/30/2019 1402   CALCIUM 8.8 (L) 04/30/2024 1617   PROT 7.1 04/30/2024 1617   ALBUMIN 3.9 04/30/2024 1617   AST 18 04/30/2024 1617   ALT 15 04/30/2024 1617   ALKPHOS 61 04/30/2024 1617   BILITOT 0.6 04/30/2024 1617   GFRNONAA >60 04/30/2024 1617   GFRNONAA 107 01/30/2019 1402   GFRAA 124 01/30/2019 1402     Assessment/Plan:   Assessment & Plan Chronic constipation with intermittent diarrhea Chronic constipation with overflow diarrhea suspected due to infrequent bowel movements and stool impactions. Linzess  at low dose insufficient. Ozempic may slow GI transit.  Issues with constipation new within the last couple of years.  Has also had some intermittent rectal bleeding, usually following episodes of violent diarrhea.  Last colonoscopy 2018 with finding of 1 hyperplastic polyp, diverticulosis, internal hemorrhoids, and recall recommended in 10 years.  As she has had a change in normal bowel habits we will schedule repeat colonoscopy now.  - Ordered abdominal x-ray to assess stool burden. - Provided higher dose Linzess  145 mcg samples for trial. - Schedule colonoscopy. I thoroughly discussed the procedure with the patient to include nature of the procedure, alternatives, benefits, and risks (including but not limited to bleeding, infection, perforation, anesthesia/cardiac/pulmonary complications). Patient verbalized understanding and gave verbal consent to proceed with procedure.  - Labs today: CBC, CMP, TSH, TTG, IgA - Follow up with Dr. Shila after procedure  Suspected gastroparesis Delayed gastric  emptying noted on previous endoscopy. Symptoms include nausea, possibly exacerbated by Ozempic and marijuana use.  No significant vomiting.  - Schedule gastric emptying study. - Discussed dietary modifications including smaller, more frequent meals.   Camie Furbish, PA-C [facility] 07/17/2024, 10:15 AM  Patient Care Team: Ilah Crigler, MD as PCP - General (Family Medicine)       [1]  Social History Tobacco Use   Smoking status: Never   Smokeless tobacco: Never  Vaping Use   Vaping status: Never Used  Substance Use Topics   Alcohol use: Yes    Comment: Occasionally   Drug use: No

## 2024-07-17 NOTE — Patient Instructions (Addendum)
 Your provider has requested that you go to the basement level for lab work before leaving today. Press B on the elevator. The lab is located at the first door on the left as you exit the elevator.  Please go to the x-ray room in the basement before leaving today.  We are providing you with samples of Linzess  145mcg to try today. Let us  know if they work.   You have been scheduled for a gastric emptying scan at [facility] on 08/03/2024 at 8:00am. Please arrive at least 30 minutes prior to your appointment for registration. Please make certain not to have anything to eat or drink after midnight the night before your test. Hold all stomach medications (ex: Zofran , phenergan , Reglan ) 24 hours prior to your test. If you need to reschedule your appointment, please contact radiology scheduling at [phone]. _____________________________________________________________________ A gastric-emptying study measures how long it takes for food to move through your stomach. There are several ways to measure stomach emptying. In the most common test, you eat food that contains a small amount of radioactive material. A scanner that detects the movement of the radioactive material is placed over your abdomen to monitor the rate at which food leaves your stomach. This test normally takes about 4 hours to complete. _____________________________________________________________________   Rosine have been scheduled for a colonoscopy. Please follow written instructions given to you at your visit today.   If you use inhalers (even only as needed), please bring them with you on the day of your procedure.  DO NOT TAKE 7 DAYS PRIOR TO TEST- Trulicity (dulaglutide) Ozempic, Wegovy (semaglutide) Mounjaro, Zepbound (tirzepatide) Bydureon Bcise (exanatide extended release)  DO NOT TAKE 1 DAY PRIOR TO YOUR TEST Rybelsus (semaglutide) Adlyxin (lixisenatide) Victoza (liraglutide) Byetta  (exanatide) ___________________________________________________________________________   I appreciate the opportunity to care for you. Camie Furbish, PA

## 2024-07-19 LAB — IGA: Immunoglobulin A: 180 mg/dL (ref 47–310)

## 2024-07-19 LAB — TISSUE TRANSGLUTAMINASE, IGA: (tTG) Ab, IgA: <1 U/mL

## 2024-07-20 ENCOUNTER — Ambulatory Visit: Payer: Self-pay | Admitting: Gastroenterology

## 2024-08-03 ENCOUNTER — Encounter (HOSPITAL_COMMUNITY): Payer: Self-pay

## 2024-08-03 ENCOUNTER — Encounter (HOSPITAL_COMMUNITY)
Admission: RE | Admit: 2024-08-03 | Discharge: 2024-08-03 | Disposition: A | Discharge status: Home / Self Care | Payer: MEDICAID | Source: Ambulatory Visit | Attending: Gastroenterology | Admitting: Gastroenterology

## 2024-08-03 DIAGNOSIS — K529 Noninfective gastroenteritis and colitis, unspecified: Secondary | ICD-10-CM | POA: Diagnosis present

## 2024-08-03 MED ORDER — TECHNETIUM TC 99M SULFUR COLLOID
2.0000 | Freq: Once | INTRAVENOUS | Status: AC
  Administered 2024-08-03: 2.2 via ORAL

## 2024-08-07 ENCOUNTER — Other Ambulatory Visit (HOSPITAL_COMMUNITY): Payer: Self-pay

## 2024-08-09 ENCOUNTER — Other Ambulatory Visit (HOSPITAL_COMMUNITY): Payer: Self-pay

## 2024-08-09 MED ORDER — OXYCODONE HCL 15 MG PO TABS
15.0000 mg | ORAL_TABLET | Freq: Three times a day (TID) | ORAL | 0 refills | Status: AC
  Filled 2024-09-04: qty 90, 30d supply, fill #0

## 2024-08-18 ENCOUNTER — Other Ambulatory Visit (HOSPITAL_COMMUNITY): Payer: Self-pay

## 2024-08-20 NOTE — Addendum Note (Signed)
 Encounter addended by: Crawford Rea POUR on: 08/20/2024 1:39 PM  Actions taken: Imaging Exam ended, Charge Capture section accepted

## 2024-08-21 ENCOUNTER — Ambulatory Visit
Admission: EM | Admit: 2024-08-21 | Discharge: 2024-08-21 | Disposition: A | Discharge status: Home / Self Care | Payer: MEDICAID

## 2024-08-21 ENCOUNTER — Encounter: Payer: Self-pay | Admitting: Emergency Medicine

## 2024-08-21 ENCOUNTER — Other Ambulatory Visit: Payer: Self-pay

## 2024-08-21 DIAGNOSIS — K648 Other hemorrhoids: Secondary | ICD-10-CM

## 2024-08-21 NOTE — ED Provider Notes (Signed)
 " [facility]    CSN: [id number] Arrival date & time: 08/21/24  1142      History   Chief Complaint Chief Complaint  Patient presents with   Rectal Bleeding    HPI Angela Cisneros is a 54 y.o. female.   Pt presents today due to sudden onset of bright red blood per rectum that she noticed today. Pt has recently has several weeks worth of loose stool. Pt states she has a hx of constipation but was placed on Linzess . Pt denies known hx of hemorrhoids. Pt is being followed by GI for chronic diarrhea, has a colonoscopy scheduled.   The history is provided by the patient.  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   History of hypertension 04/25/2017   History of diabetes mellitus 04/25/2017   History of gluten intolerance 04/25/2017   Lump in throat 04/11/2017   Thyroid  disease 10/20/2016   ANA positive 10/18/2016   Essential hypertension 10/18/2016   Type 2 diabetes mellitus (HCC) 10/18/2016   Other fatigue 10/18/2016   Hair thinning 10/18/2016   Rectal bleeding 07/21/2016   Chronic diarrhea 07/21/2016   Diarrhea 07/14/2016   Bloating 07/14/2016   Myalgia 06/17/2016   Primary osteoarthritis of both knees 06/17/2016   S/P panniculectomy 11/18/2014   Abdominal pain 01/08/2009   NAUSEA WITH VOMITING 01/08/2009   TROCHANTERIC BURSITIS, RIGHT 12/12/2008   ALLERGIC REACTION 10/11/2008    Past Surgical History:  Procedure Laterality Date   CHOLECYSTECTOMY     COLONOSCOPY     KNEE SURGERY     LIPOSUCTION N/A 11/18/2014   Procedure: LIPOSUCTION;  Surgeon: Elna Pick, MD;  Location: [facility] OR;  Service: Plastics;   Laterality: N/A;   PANNICULECTOMY  11/18/2014   with repair / diastasis recti   PANNICULECTOMY N/A 11/18/2014   Procedure: PANNICULECTOMY WITH REPAIR OF DIASTASIS RECTI ;  Surgeon: Elna Pick, MD;  Location: [facility] OR;  Service: Plastics;  Laterality: N/A;   POLYPECTOMY     THYROID  SURGERY     TUBAL LIGATION     VAGINAL HYSTERECTOMY      OB History     Gravida  6   Para  6   Term  5   Preterm  1   AB      Living  6      SAB      IAB      Ectopic      Multiple      Live Births               Home Medications    Prior to Admission medications  Medication Sig Start Date End Date Taking? Authorizing Provider  ALPRAZolam  (XANAX ) 0.5 MG tablet Take 1 tablet (0.5 mg total) by mouth 3 (three) times daily. 03/07/24     cholestyramine  (QUESTRAN ) 4 g packet Take 1 packet (4 g total) by mouth 2 (two) times daily. 02/17/17   Zehr, Jessica D, PA-C  Continuous Glucose Transmitter (DEXCOM G6 TRANSMITTER) MISC Replace transmitter every 3 months 03/07/24     diclofenac  sodium (VOLTAREN ) 1 % GEL Apply 4 g topically 4 (four) times daily.  10/21/16   Dolphus Reiter, MD  gabapentin  (NEURONTIN ) 300 MG capsule Take 1 capsule (300 mg total) by mouth 2 (two) times daily. 02/07/24   Camara, Amadou, MD  gabapentin  (NEURONTIN ) 600 MG tablet Take 1 tablet (600 mg total) by mouth 3 (three) times daily. 03/07/24     gabapentin  (NEURONTIN ) 600 MG tablet Take 1 tablet (600 mg total) by mouth 3 (three) times daily. 04/09/24     HUMALOG MIX 75/25 KWIKPEN (75-25) 100 UNIT/ML Kwikpen INJECT 30 UNITS SQ BID Patient not taking: Reported on 07/17/2024 12/13/18   [provider]  lactulose  (CHRONULAC ) 10 GM/15ML solution Take 30 mLs (20 g total) by mouth 3 (three) times daily. 05/02/24     linaclotide  (LINZESS ) 145 MCG CAPS capsule Take 1 capsule (145 mcg total) by mouth daily before breakfast. Samples given to patient    Lot#  [id number]                 Exp.      09/2024            Amount # 16  07/17/24   Heinz, Sara E, PA-C  linaclotide  (LINZESS ) 72 MCG capsule Take 1 capsule (72 mcg total) by mouth daily at least 30 minutes before first meal. 05/02/24     metoCLOPramide  (REGLAN ) 10 MG tablet Take 1 tablet (10 mg total) by mouth every 8 (eight) hours as needed for nausea. 01/13/22   Zehr, Jessica D, PA-C  Na Sulfate-K Sulfate-Mg Sulfate concentrate (SUPREP) 17.5-3.13-1.6 GM/177ML SOLN Take 1 kit (354 mLs total) by mouth as directed. 07/17/24   Arletta, Sara E, PA-C  naloxone [facility]) nasal spray 4 mg/0.1 mL SMARTSIG:Both Nares 01/02/22   [provider]  olmesartan-hydrochlorothiazide (BENICAR HCT) 40-12.5 MG tablet Take 1 tablet by mouth daily. 09/15/17   [provider]  ondansetron  (ZOFRAN -ODT) 4 MG disintegrating tablet Take 1 tablet (4 mg total) by mouth every 8 (eight) hours as needed for nausea or vomiting. 02/04/24   Scott, Rocky SAILOR, PA-C  ondansetron  (ZOFRAN -ODT) 4 MG disintegrating tablet Take 1 tablet (4 mg total) by mouth 4 (four) times daily. 05/02/24     oxyCODONE  (ROXICODONE ) 15 MG immediate release tablet Take 1 tablet (15 mg total) by mouth 3 (three) times daily. 02/09/24   Ilah Crigler, MD  oxyCODONE  (ROXICODONE ) 15 MG immediate release tablet Take 1 tablet (15 mg total) by mouth 3 (three) times daily. 03/08/24   Ilah Crigler, MD  oxyCODONE  (ROXICODONE ) 15 MG immediate release tablet Take 1 tablet (15 mg total) by mouth 3 (three) times daily. 04/09/24   Ilah Crigler, MD  oxyCODONE  (ROXICODONE ) 15 MG immediate release tablet Take 1 tablet (15 mg total) by mouth 3 (three) times daily. 05/02/24   Ilah Crigler, MD  oxyCODONE  (ROXICODONE ) 15 MG immediate release tablet Take 1 tablet (15 mg total) by mouth 3 (three) times daily. 06/08/24   Ilah Crigler, MD  oxyCODONE  (ROXICODONE ) 15 MG immediate release tablet Take 1 tablet (15 mg total) by mouth 3 (three) times daily. 07/06/24   Ilah Crigler, MD  oxyCODONE  (ROXICODONE ) 15 MG immediate release tablet Take 1 tablet (15 mg total) by  mouth 3 (three) times daily. 08/09/24   Ilah Crigler, MD  OZEMPIC, 0.25 OR 0.5 MG/DOSE, 2 MG/3ML SOPN Inject 0.5 mg into the skin once a week. 06/22/24   [provider]  valACYclovir (VALTREX) 500 MG tablet Take 500 mg by mouth as needed.     [provider]  Vitamin D , Ergocalciferol , (DRISDOL ) 1.25  MG (50000 UT) CAPS capsule Take 1 capsule (50,000 Units total) by mouth every 7 (seven) days. 02/01/19   Dolphus Reiter, MD  loperamide (IMODIUM A-D) 2 MG tablet 2 mg. take one tab after each loose bowel movement   07/14/11  [provider]    Family History Family History  Problem Relation Age of Onset   Colon polyps Mother    Hypertension Mother    Diabetes Mother    Dementia Mother    Hypertension Brother    Hypertension Brother    Asthma Daughter    Eczema Daughter    Diabetes Daughter    Asthma Son    Eczema Son    Colon cancer Cousin        mat cousins father   Hypertension Other    Anesthesia problems Neg Hx    Hypotension Neg Hx    Malignant hyperthermia Neg Hx    Pseudochol deficiency Neg Hx    Angioedema Neg Hx    Atopy Neg Hx    Immunodeficiency Neg Hx    Urticaria Neg Hx    Esophageal cancer Neg Hx    Pancreatic cancer Neg Hx    Stomach cancer Neg Hx    Heart disease Neg Hx     Social History Social History[1]   Allergies   Canagliflozin, Corn starch, Empagliflozin, Gluten meal, and Metformin    Review of Systems Review of Systems  Gastrointestinal:  Positive for hematochezia.     Physical Exam Triage Vital Signs ED Triage Vitals [08/21/24 1215]  Encounter Vitals Group     BP 135/82     Girls Systolic BP Percentile      Girls Diastolic BP Percentile      Boys Systolic BP Percentile      Boys Diastolic BP Percentile      Pulse Rate 85     Resp 18     Temp 98.4 F (36.9 C)     Temp Source Oral     SpO2 98 %     Weight      Height      Head Circumference      Peak Flow      Pain Score 0      Pain Loc      Pain Education      Exclude from Growth Chart    No data found.  Updated Vital Signs BP 135/82 (BP Location: Right Arm)   Pulse 85   Temp 98.4 F (36.9 C) (Oral)   Resp 18   SpO2 98%   Visual Acuity Right Eye Distance:   Left Eye Distance:   Bilateral Distance:    Right Eye Near:   Left Eye Near:    Bilateral Near:     Physical Exam Vitals and nursing note reviewed. Exam conducted with a chaperone present.  Constitutional:      General: She is not in acute distress.    Appearance: Normal appearance. She is not ill-appearing, toxic-appearing or diaphoretic.  Eyes:     General: No scleral icterus. Cardiovascular:     Rate and Rhythm: Normal rate and regular rhythm.     Heart sounds: Normal heart sounds.  Pulmonary:     Effort: Pulmonary effort is normal. No respiratory distress.     Breath sounds: Normal breath sounds. No wheezing or rhonchi.  Genitourinary:    General: Normal vulva.     Rectum: Internal hemorrhoid present.     Comments: Internal hemorroid palpated at 7 o' clock.  Skin:    General: Skin is warm.  Neurological:     Mental Status: She is alert and oriented to person, place, and time.  Psychiatric:        Mood and Affect: Mood normal.        Behavior: Behavior normal.      UC Treatments / Results  Labs (all labs ordered are listed, but only abnormal results are displayed) Labs Reviewed - No data to display  EKG   Radiology No results found.  Procedures Procedures (including critical care time)  Medications Ordered in UC Medications - No data to display  Initial Impression / Assessment and Plan / UC Course  I have reviewed the triage vital signs and the nursing notes.  Pertinent labs & imaging results that were available during my care of the patient were reviewed by me and considered in my medical decision making (see chart for details).      Final Clinical Impressions(s) / UC Diagnoses   Final diagnoses:   Internal hemorrhoid     Discharge Instructions      You have been diagnosed with internal hemorrhoid today. You may use OTC preparation H products for symptoms. See attached pamphlet about hemorrhoids.    ED Prescriptions   None    PDMP not reviewed this encounter.    Andra Corean BROCKS, PA-C 08/21/24 1520      [1] Social History Tobacco Use   Smoking status: Never   Smokeless tobacco: Never  Vaping Use   Vaping status: Never Used  Substance Use Topics   Alcohol use: Yes    Comment: Occasionally   Drug use: No    Andra Corean BROCKS, PA-C 08/22/24 0801  "

## 2024-08-21 NOTE — ED Triage Notes (Signed)
 Pt sts noted some blood in her underwear this am; pt was unsure of where came from; pt then sts had urgency to have BM and noted BRB when wiping; pt denies noting blood in toilet; pt denies known hx of hemorrhoids

## 2024-08-21 NOTE — Discharge Instructions (Signed)
 You have been diagnosed with internal hemorrhoid today. You may use OTC preparation H products for symptoms. See attached pamphlet about hemorrhoids.

## 2024-08-28 ENCOUNTER — Encounter: Payer: MEDICAID | Admitting: Gastroenterology

## 2024-09-03 ENCOUNTER — Other Ambulatory Visit (HOSPITAL_COMMUNITY): Payer: Self-pay

## 2024-09-04 ENCOUNTER — Other Ambulatory Visit (HOSPITAL_COMMUNITY): Payer: Self-pay

## 2024-09-06 ENCOUNTER — Ambulatory Visit: Payer: MEDICAID | Admitting: Gastroenterology

## 2024-09-06 ENCOUNTER — Encounter: Payer: Self-pay | Admitting: Gastroenterology

## 2024-09-06 VITALS — BP 138/73 | HR 87 | Temp 97.8°F | Resp 16 | Ht 63.0 in | Wt 163.0 lb

## 2024-09-06 DIAGNOSIS — D122 Benign neoplasm of ascending colon: Secondary | ICD-10-CM

## 2024-09-06 DIAGNOSIS — K625 Hemorrhage of anus and rectum: Secondary | ICD-10-CM

## 2024-09-06 DIAGNOSIS — K529 Noninfective gastroenteritis and colitis, unspecified: Secondary | ICD-10-CM

## 2024-09-06 DIAGNOSIS — D123 Benign neoplasm of transverse colon: Secondary | ICD-10-CM

## 2024-09-06 MED ORDER — SODIUM CHLORIDE 0.9 % IV SOLN: 500.0000 mL | INTRAVENOUS | Status: DC

## 2024-09-06 NOTE — Op Note (Signed)
 [facility] Patient Name: Angela Cisneros Procedure Date: 09/06/2024 8:34 AM MRN: [id number] Endoscopist: Glendia E. Stacia , MD, [id number] Age: 54 Referring MD:  Date of Birth: 10-Apr-1971 Gender: Female Account #: [id number] Procedure:                Colonoscopy Indications:              Change in bowel habits Medicines:                Monitored Anesthesia Care Procedure:                Pre-Anesthesia Assessment:                           - Prior to the procedure, a History and Physical                            was performed, and patient medications and                            allergies were reviewed. The patient's tolerance of                            previous anesthesia was also reviewed. The risks                            and benefits of the procedure and the sedation                            options and risks were discussed with the patient.                            All questions were answered, and informed consent                            was obtained. Prior Anticoagulants: The patient has                            taken no anticoagulant or antiplatelet agents. ASA                            Grade Assessment: III - A patient with severe                            systemic disease. After reviewing the risks and                            benefits, the patient was deemed in satisfactory                            condition to undergo the procedure.                           After obtaining informed consent, the colonoscope  was passed under direct vision. Throughout the                            procedure, the patient's blood pressure, pulse, and                            oxygen saturations were monitored continuously. The                            CF [id number] #[id number] was introduced through the anus                            and advanced to the the terminal ileum, with                            identification of the  appendiceal orifice and IC                            valve. The colonoscopy was performed without                            difficulty. The patient tolerated the procedure                            well. The quality of the bowel preparation was                            adequate. The terminal ileum, ileocecal valve,                            appendiceal orifice, and rectum were photographed.                            The bowel preparation used was SUPREP via split                            dose instruction. Scope In: 9:37:04 AM Scope Out: 9:59:54 AM Scope Withdrawal Time: 0 hours 18 minutes 14 seconds  Total Procedure Duration: 0 hours 22 minutes 50 seconds  Findings:                 The perianal and digital rectal examinations were                            normal. Pertinent negatives include normal                            sphincter tone and no palpable rectal lesions.                           Two sessile polyps were found in the hepatic                            flexure and ascending colon. The polyps were 3 to 7  mm in size. These polyps were removed with a cold                            snare. Resection and retrieval were complete.                            Estimated blood loss was minimal.                           Multiple medium-mouthed and small-mouthed                            diverticula were found in the sigmoid colon,                            descending colon and transverse colon.                           Normal mucosa was found in the entire colon.                            Biopsies for histology were taken with a cold                            forceps from the ascending colon, transverse colon,                            descending colon and sigmoid colon for evaluation                            of microscopic colitis. Estimated blood loss was                            minimal.                           The terminal ileum  appeared normal. Complications:            No immediate complications. Estimated Blood Loss:     Estimated blood loss was minimal. Impression:               - Two 3 to 7 mm polyps at the hepatic flexure and                            in the ascending colon, removed with a cold snare.                            Resected and retrieved.                           - Mild diverticulosis in the sigmoid colon, in the                            descending colon and in the transverse colon.                           -  Normal mucosa in the entire examined colon.                            Biopsied.                           - The examined portion of the ileum was normal. Recommendation:           - Patient has a contact number available for                            emergencies. The signs and symptoms of potential                            delayed complications were discussed with the                            patient. Return to normal activities tomorrow.                            Written discharge instructions were provided to the                            patient.                           - Resume previous diet.                           - Continue present medications.                           - Await pathology results.                           - Repeat colonoscopy (date not yet determined) for                            surveillance based on pathology results.                           - Would recommend pelvic floor physical therapy or                            anorectal manometry if incontinence continues                            following bowel prep.                           - Follow up with Dr. Shila in the office Altariq Goodall E. Stacia, MD 09/06/2024 10:10:16 AM This report has been signed electronically.

## 2024-09-06 NOTE — Progress Notes (Signed)
 Patient's CBG 371 this morning. CRNA and MD aware.

## 2024-09-06 NOTE — Progress Notes (Signed)
 Called to room to assist during endoscopic procedure.  Patient ID and intended procedure confirmed with present staff. Received instructions for my participation in the procedure from the performing physician.

## 2024-09-06 NOTE — Patient Instructions (Addendum)
 Handouts provided on polyps and diverticulosis.  Resume previous diet.  Continue present medications.  Await pathology results.  Repeat colonoscopy (date not yet determined) for surveillance based on pathology results.  Would recommend pelvic floor physical therapy or anorectal manometry if incontinence continues following bowel prep.  Follow up with Dr. Shila in the office, her nurse will call to schedule this appointment.   YOU HAD AN ENDOSCOPIC PROCEDURE TODAY AT THE [facility]:   Refer to the procedure report that was given to you for any specific questions about what was found during the examination.  If the procedure report does not answer your questions, please call your gastroenterologist to clarify.  If you requested that your care partner not be given the details of your procedure findings, then the procedure report has been included in a sealed envelope for you to review at your convenience later.  YOU SHOULD EXPECT: Some feelings of bloating in the abdomen. Passage of more gas than usual.  Walking can help get rid of the air that was put into your GI tract during the procedure and reduce the bloating. If you had a lower endoscopy (such as a colonoscopy or flexible sigmoidoscopy) you may notice spotting of blood in your stool or on the toilet paper. If you underwent a bowel prep for your procedure, you may not have a normal bowel movement for a few days.  Please Note:  You might notice some irritation and congestion in your nose or some drainage.  This is from the oxygen used during your procedure.  There is no need for concern and it should clear up in a day or so.  SYMPTOMS TO REPORT IMMEDIATELY:  Following lower endoscopy (colonoscopy or flexible sigmoidoscopy):  Excessive amounts of blood in the stool  Significant tenderness or worsening of abdominal pains  Swelling of the abdomen that is new, acute  Fever of 100F or higher  For urgent or emergent issues, a  gastroenterologist can be reached at any hour by calling [phone]. Do not use MyChart messaging for urgent concerns.    DIET:  We do recommend a small meal at first, but then you may proceed to your regular diet.  Drink plenty of fluids but you should avoid alcoholic beverages for 24 hours.  ACTIVITY:  You should plan to take it easy for the rest of today and you should NOT DRIVE or use heavy machinery until tomorrow (because of the sedation medicines used during the test).    FOLLOW UP: Our staff will call the number listed on your records the next business day following your procedure.  We will call around 7:15- 8:00 am to check on you and address any questions or concerns that you may have regarding the information given to you following your procedure. If we do not reach you, we will leave a message.     If any biopsies were taken you will be contacted by phone or by letter within the next 1-3 weeks.  Please call us  at [phone] if you have not heard about the biopsies in 3 weeks.    SIGNATURES/CONFIDENTIALITY: You and/or your care partner have signed paperwork which will be entered into your electronic medical record.  These signatures attest to the fact that that the information above on your After Visit Summary has been reviewed and is understood.  Full responsibility of the confidentiality of this discharge information lies with you and/or your care-partner.

## 2024-09-06 NOTE — Progress Notes (Signed)
 [facility] History and Physical   Primary Care Physician:  Ilah Crigler, MD   Reason for Procedure:   Change in bowel habits  Plan:    Colonoscopy   HPI: Angela Cisneros is a 54 y.o. female undergoing colonoscopy to evaluate recent change in bowel habits.  She has had constipation alternating with periods of diarrhea with incontinence.  Currently experiencing more diarrhea with incontinence, usually 5-6 bowel movements per day.  Stools persistently loose.  Had questionable microscopic colitis on biopsies in 2015 but normal biopsies in 2018.  The patient was provided an opportunity to ask questions and all were answered. The patient agreed with the plan    Past Medical History:  Diagnosis Date   Diabetes mellitus without complication (HCC)    weight controlled   Fibroid, uterine    Headache(784.0)    Herpes    Hyperlipidemia    Hypertension    Hypothyroidism     Past Surgical History:  Procedure Laterality Date   CHOLECYSTECTOMY     COLONOSCOPY     KNEE SURGERY Left    LIPOSUCTION N/A 11/18/2014   Procedure: LIPOSUCTION;  Surgeon: Elna Pick, MD;  Location: [facility] OR;  Service: Plastics;  Laterality: N/A;   PANNICULECTOMY  11/18/2014   with repair / diastasis recti   PANNICULECTOMY N/A 11/18/2014   Procedure: PANNICULECTOMY WITH REPAIR OF DIASTASIS RECTI ;  Surgeon: Elna Pick, MD;  Location: [facility] OR;  Service: Plastics;  Laterality: N/A;   POLYPECTOMY     THYROID  SURGERY     TUBAL LIGATION     VAGINAL HYSTERECTOMY      Prior to Admission medications  Medication Sig Start Date End Date Taking? Authorizing Provider  clobetasol cream (TEMOVATE) 0.05 % SMARTSIG:sparingly Topical Twice Daily 08/16/24  Yes [provider]  gabapentin  (NEURONTIN ) 600 MG tablet Take 1 tablet (600 mg total) by mouth 3 (three) times daily. 04/09/24  Yes   linaclotide  (LINZESS ) 145 MCG CAPS capsule Take 1 capsule (145 mcg total) by mouth daily before breakfast.  Samples given to patient    Lot#  [id number]                 Exp.      09/2024            Amount # 16 07/17/24  Yes Heinz, Sara E, PA-C  olmesartan-hydrochlorothiazide (BENICAR HCT) 40-12.5 MG tablet Take 1 tablet by mouth daily. 09/15/17  Yes [provider]  ondansetron  (ZOFRAN -ODT) 4 MG disintegrating tablet Take 1 tablet (4 mg total) by mouth 4 (four) times daily. 05/02/24  Yes   oxyCODONE  (ROXICODONE ) 15 MG immediate release tablet Take 1 tablet (15 mg total) by mouth 3 (three) times daily. 08/09/24  Yes Ilah Crigler, MD  valACYclovir (VALTREX) 500 MG tablet Take 500 mg by mouth as needed.    Yes [provider]  Vitamin D , Ergocalciferol , (DRISDOL ) 1.25 MG (50000 UT) CAPS capsule Take 1 capsule (50,000 Units total) by mouth every 7 (seven) days. 02/01/19  Yes Deveshwar, Maya, MD  ALPRAZolam  (XANAX ) 0.5 MG tablet Take 1 tablet (0.5 mg total) by mouth 3 (three) times daily. 03/07/24     cholestyramine  (QUESTRAN ) 4 g packet Take 1 packet (4 g total) by mouth 2 (two) times daily. 02/17/17   Zehr, Jessica D, PA-C  diclofenac  sodium (VOLTAREN ) 1 % GEL Apply 4 g topically 4 (four) times daily. 10/21/16   Dolphus Maya, MD  naloxone [facility]) nasal spray 4 mg/0.1 mL SMARTSIG:Both Nares 01/02/22  [provider]  loperamide (IMODIUM A-D) 2 MG tablet 2 mg. take one tab after each loose bowel movement   07/14/11  [provider]    Current Outpatient Medications  Medication Sig Dispense Refill   clobetasol cream (TEMOVATE) 0.05 % SMARTSIG:sparingly Topical Twice Daily     gabapentin  (NEURONTIN ) 600 MG tablet Take 1 tablet (600 mg total) by mouth 3 (three) times daily. 90 tablet 2   linaclotide  (LINZESS ) 145 MCG CAPS capsule Take 1 capsule (145 mcg total) by mouth daily before breakfast. Samples given to patient    Lot#  [id number]                 Exp.      09/2024            Amount # 16 16 capsule 0   olmesartan-hydrochlorothiazide (BENICAR HCT) 40-12.5 MG tablet Take 1 tablet by  mouth daily.  0   ondansetron  (ZOFRAN -ODT) 4 MG disintegrating tablet Take 1 tablet (4 mg total) by mouth 4 (four) times daily. 120 tablet 0   oxyCODONE  (ROXICODONE ) 15 MG immediate release tablet Take 1 tablet (15 mg total) by mouth 3 (three) times daily. 90 tablet 0   valACYclovir (VALTREX) 500 MG tablet Take 500 mg by mouth as needed.      Vitamin D , Ergocalciferol , (DRISDOL ) 1.25 MG (50000 UT) CAPS capsule Take 1 capsule (50,000 Units total) by mouth every 7 (seven) days. 12 capsule 0   ALPRAZolam  (XANAX ) 0.5 MG tablet Take 1 tablet (0.5 mg total) by mouth 3 (three) times daily. 90 tablet 1   cholestyramine  (QUESTRAN ) 4 g packet Take 1 packet (4 g total) by mouth 2 (two) times daily. 60 each 1   diclofenac  sodium (VOLTAREN ) 1 % GEL Apply 4 g topically 4 (four) times daily. 5 Tube 1   naloxone (NARCAN) nasal spray 4 mg/0.1 mL SMARTSIG:Both Nares     Current Facility-Administered Medications  Medication Dose Route Frequency Provider Last Rate Last Admin   0.9 %  sodium chloride  infusion  500 mL Intravenous Continuous Stacia Glendia BRAVO, MD        Allergies as of 09/06/2024 - Review Complete 09/06/2024  Allergen Reaction Noted   Canagliflozin Other (See Comments) 05/03/2017   Corn starch Hives and Itching 11/12/2014   Empagliflozin Other (See Comments) 05/03/2017   Gluten meal Other (See Comments) 04/26/2017   Metformin  Other (See Comments) 05/03/2017    Family History  Problem Relation Age of Onset   Colon polyps Mother    Hypertension Mother    Diabetes Mother    Dementia Mother    Hypertension Brother    Hypertension Brother    Asthma Daughter    Eczema Daughter    Diabetes Daughter    Asthma Son    Eczema Son    Colon cancer Cousin        mat cousins father   Hypertension Other    Anesthesia problems Neg Hx    Hypotension Neg Hx    Malignant hyperthermia Neg Hx    Pseudochol deficiency Neg Hx    Angioedema Neg Hx    Atopy Neg Hx    Immunodeficiency Neg Hx     Urticaria Neg Hx    Esophageal cancer Neg Hx    Pancreatic cancer Neg Hx    Stomach cancer Neg Hx    Heart disease Neg Hx    Rectal cancer Neg Hx     Social History   Socioeconomic History   Marital  status: Widowed    Spouse name: Not on file   Number of children: 6   Years of education: 12   Highest education level: Not on file  Occupational History   Occupation: Midwife  Tobacco Use   Smoking status: Never   Smokeless tobacco: Never  Vaping Use   Vaping status: Never Used  Substance and Sexual Activity   Alcohol use: Not Currently    Comment: Occasionally   Drug use: Yes    Types: Marijuana    Comment: occasional - brownies   Sexual activity: Yes    Partners: Male    Birth control/protection: Surgical  Other Topics Concern   Not on file  Social History Narrative   Fun/Hobby: No hobbies   Social Drivers of Health   Tobacco Use: Low Risk (09/06/2024)   Patient History    Smoking Tobacco Use: Never    Smokeless Tobacco Use: Never    Passive Exposure: Not on file  Financial Resource Strain: Not on file  Food Insecurity: Not on file  Transportation Needs: Not on file  Physical Activity: Not on file  Stress: Not on file  Social Connections: Unknown (12/13/2021)   Received from [facility]   Social Network    Social Network: Not on file  Intimate Partner Violence: Unknown (11/04/2021)   Received from [facility]   HITS    Physically Hurt: Not on file    Insult or Talk Down To: Not on file    Threaten Physical Harm: Not on file    Scream or Curse: Not on file  Depression (EYV7-0): Not on file  Alcohol Screen: Not on file  Housing: Not on file  Utilities: Not on file  Health Literacy: Not on file    Review of Systems:  All other review of systems negative except as mentioned in the HPI.  Physical Exam: Vital signs BP 119/71   Pulse 84   Temp 97.8 F (36.6 C) (Temporal)   Ht 5' 3 (1.6 m)   Wt 163 lb (73.9 kg)   SpO2 99%   BMI 28.87 kg/m    General:   Alert,  Well-developed, well-nourished, pleasant and cooperative in NAD Airway:  Mallampati 2 Lungs:  Clear throughout to auscultation.   Heart:  Regular rate and rhythm; no murmurs, clicks, rubs,  or gallops. Abdomen:  Soft, nontender and nondistended. Normal bowel sounds.   Neuro/Psych:  Normal mood and affect. A and O x 3   Aaminah Forrester E. Stacia, MD [facility]

## 2024-09-06 NOTE — Progress Notes (Signed)
 Vss nad trans to pacu

## 2024-09-07 ENCOUNTER — Telehealth: Payer: Self-pay

## 2024-09-07 NOTE — Telephone Encounter (Signed)
" °  Follow up Call-     09/06/2024    7:50 AM 02/16/2022    9:24 AM  Call back number  Post procedure Call Back phone  # [phone]  Permission to leave phone message Yes Yes     Patient questions:  Do you have a fever, pain , or abdominal swelling? No. Pain Score  0 *  Have you tolerated food without any problems? Yes.    Have you been able to return to your normal activities? Yes.    Do you have any questions about your discharge instructions: Diet   No. Medications  No. Follow up visit  No.  Do you have questions or concerns about your Care? No.  Actions: * If pain score is 4 or above: No action needed, pain <4.   "

## 2024-11-13 ENCOUNTER — Ambulatory Visit: Payer: MEDICAID | Admitting: Gastroenterology
